# Patient Record
Sex: Female | Born: 1955 | Race: White | Hispanic: No | Marital: Married | Smoking: Never smoker
Health system: Southern US, Community
[De-identification: ages and names within clinical notes are randomized; demographics above are authoritative.]

## PROBLEM LIST (undated history)

## (undated) DIAGNOSIS — M810 Age-related osteoporosis without current pathological fracture: Secondary | ICD-10-CM

## (undated) DIAGNOSIS — I10 Essential (primary) hypertension: Secondary | ICD-10-CM

## (undated) DIAGNOSIS — E559 Vitamin D deficiency, unspecified: Secondary | ICD-10-CM

## (undated) DIAGNOSIS — E785 Hyperlipidemia, unspecified: Secondary | ICD-10-CM

## (undated) DIAGNOSIS — K219 Gastro-esophageal reflux disease without esophagitis: Secondary | ICD-10-CM

## (undated) DIAGNOSIS — Z860100 Personal history of colon polyps, unspecified: Secondary | ICD-10-CM

## (undated) DIAGNOSIS — N900 Mild vulvar dysplasia: Secondary | ICD-10-CM

## (undated) DIAGNOSIS — L409 Psoriasis, unspecified: Secondary | ICD-10-CM

## (undated) DIAGNOSIS — T7840XA Allergy, unspecified, initial encounter: Secondary | ICD-10-CM

## (undated) DIAGNOSIS — N761 Subacute and chronic vaginitis: Secondary | ICD-10-CM

## (undated) DIAGNOSIS — K2270 Barrett's esophagus without dysplasia: Secondary | ICD-10-CM

## (undated) DIAGNOSIS — J45909 Unspecified asthma, uncomplicated: Secondary | ICD-10-CM

## (undated) DIAGNOSIS — M1990 Unspecified osteoarthritis, unspecified site: Secondary | ICD-10-CM

## (undated) DIAGNOSIS — B019 Varicella without complication: Secondary | ICD-10-CM

## (undated) DIAGNOSIS — R7303 Prediabetes: Secondary | ICD-10-CM

## (undated) HISTORY — DX: Gastro-esophageal reflux disease without esophagitis: K21.9

## (undated) HISTORY — DX: Personal history of colonic polyps: Z86.010

## (undated) HISTORY — DX: Age-related osteoporosis without current pathological fracture: M81.0

## (undated) HISTORY — DX: Barrett's esophagus without dysplasia: K22.70

## (undated) HISTORY — DX: Allergy, unspecified, initial encounter: T78.40XA

## (undated) HISTORY — DX: Varicella without complication: B01.9

## (undated) HISTORY — DX: Essential (primary) hypertension: I10

## (undated) HISTORY — DX: Unspecified osteoarthritis, unspecified site: M19.90

## (undated) HISTORY — DX: Personal history of colon polyps, unspecified: Z86.0100

## (undated) HISTORY — DX: Subacute and chronic vaginitis: N76.1

## (undated) HISTORY — DX: Vitamin D deficiency, unspecified: E55.9

## (undated) HISTORY — DX: Hyperlipidemia, unspecified: E78.5

## (undated) HISTORY — DX: Unspecified asthma, uncomplicated: J45.909

---
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
## 1981-12-31 HISTORY — PX: CHOLECYSTECTOMY: SHX55

## 2007-02-17 ENCOUNTER — Emergency Department: Payer: Self-pay | Admitting: Emergency Medicine

## 2007-09-03 ENCOUNTER — Ambulatory Visit: Payer: Self-pay | Admitting: Internal Medicine

## 2007-11-11 ENCOUNTER — Ambulatory Visit: Payer: Self-pay | Admitting: Internal Medicine

## 2008-01-22 ENCOUNTER — Ambulatory Visit: Payer: Self-pay | Admitting: Podiatry

## 2009-03-17 IMAGING — CR RIGHT HIP - COMPLETE 2+ VIEW
1 series · 2 of 2 positions shown · non-contrast
Comparison: none

REASON FOR EXAM: Sprain and swelling three days ago
COMMENTS:

[Series 1: view not recorded · 0.17mm/px · 2 of 2 slices shown]
[im 1/2]
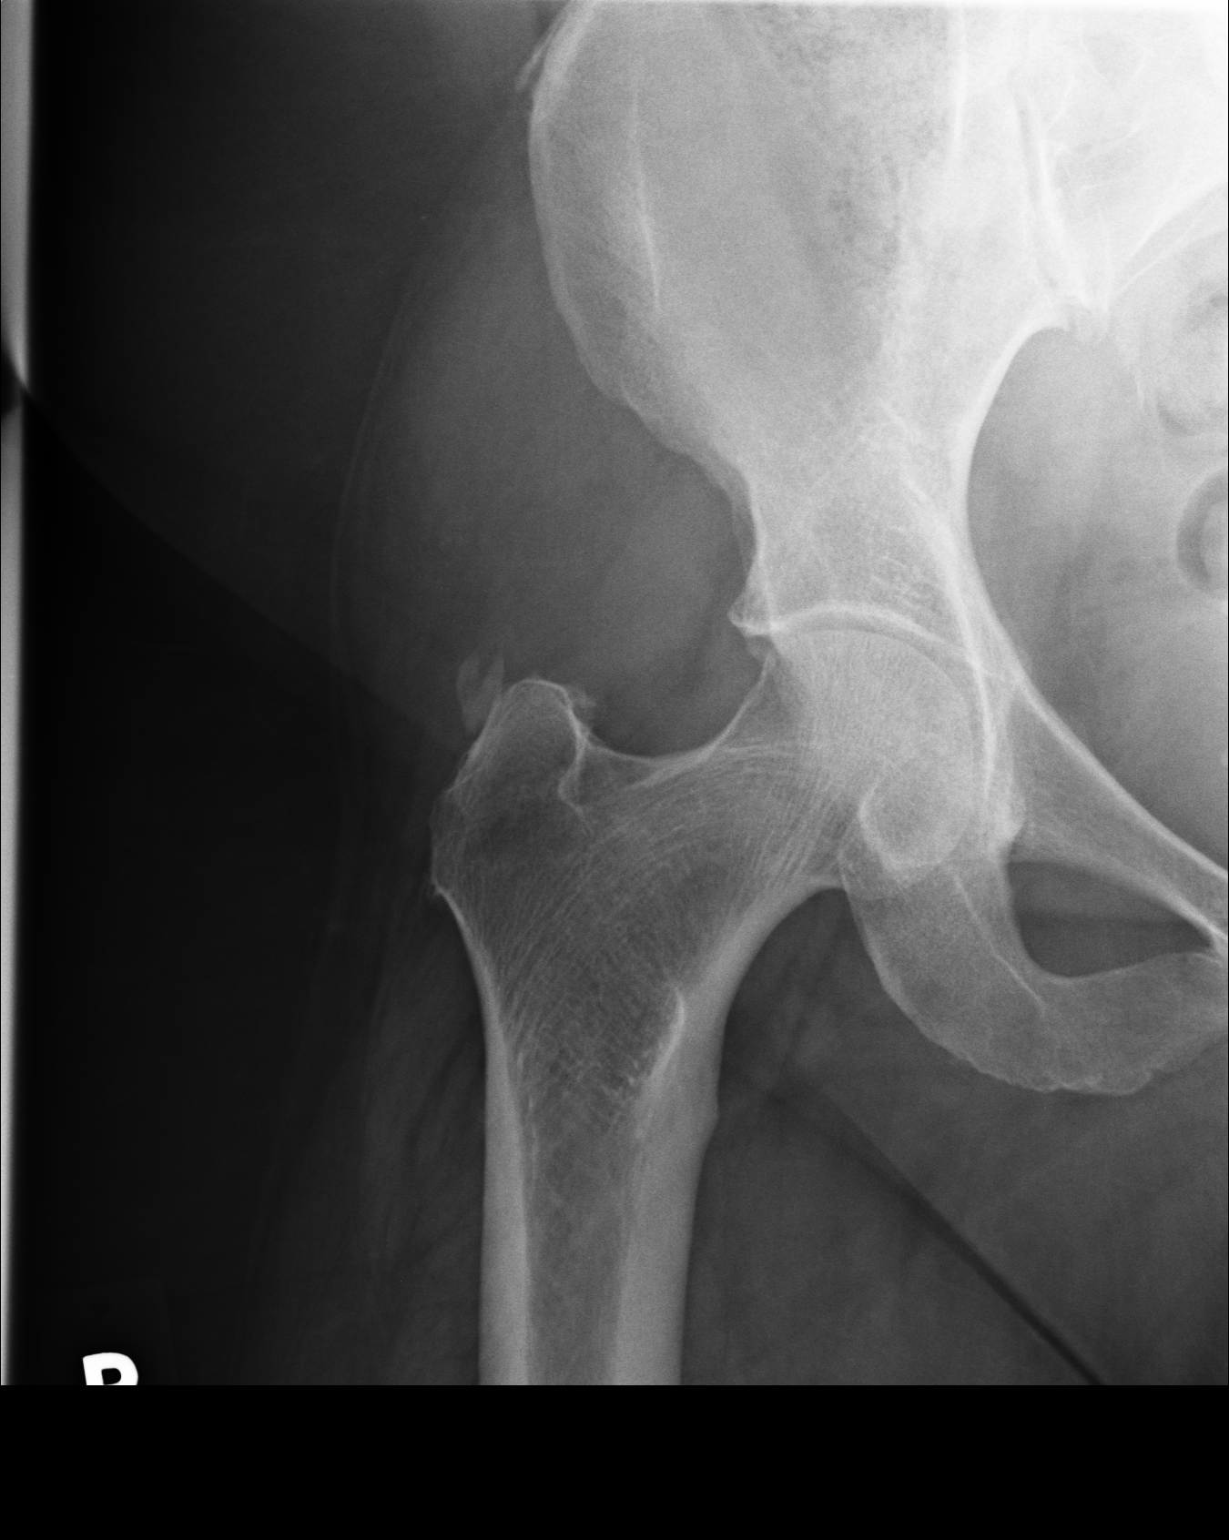
[im 2/2]
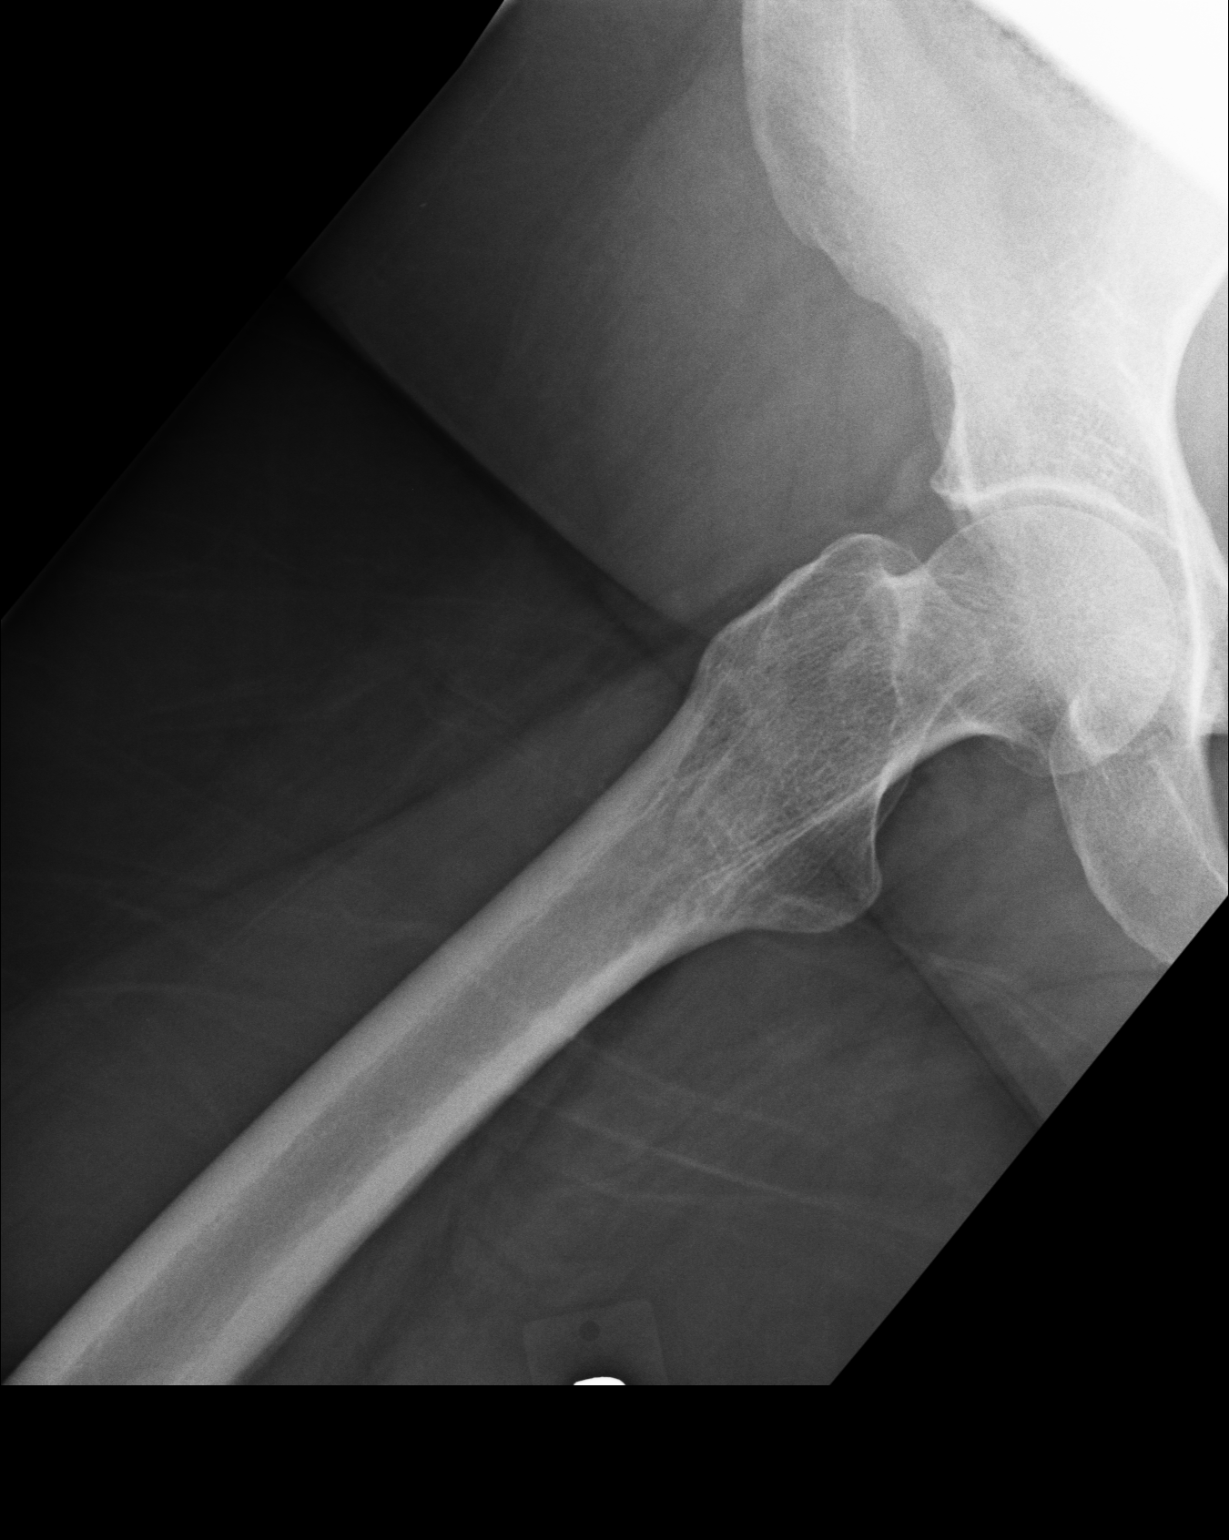

[2 of 2 positions shown; findings below may reference images not displayed]

PROCEDURE:     DXR - DXR HIP RIGHT COMPLETE  - November 11, 2007  [DATE]

RESULT:     There is calcification adjacent to the greater tuberosity that
likely reflects calcific bursitis. The femoral head and neck and
intertrochanteric region are normally mineralized with no evidence of acute
or old fracture. The acetabular components appear normal and the joint
spaces preserved.
IMPRESSION: I see no acute abnormality of the hip joint but I am
suspicious that there is calcific bursitis associated with the greater
trochanter.

## 2009-03-17 IMAGING — CR RIGHT ANKLE - COMPLETE 3+ VIEW
1 series · 7 of 7 positions shown · non-contrast
Comparison: none

REASON FOR EXAM: Sprain and swelling three days ago
COMMENTS:

[Series 1: view not recorded · 0.17mm/px · 7 of 7 slices shown]
[im 1/7]
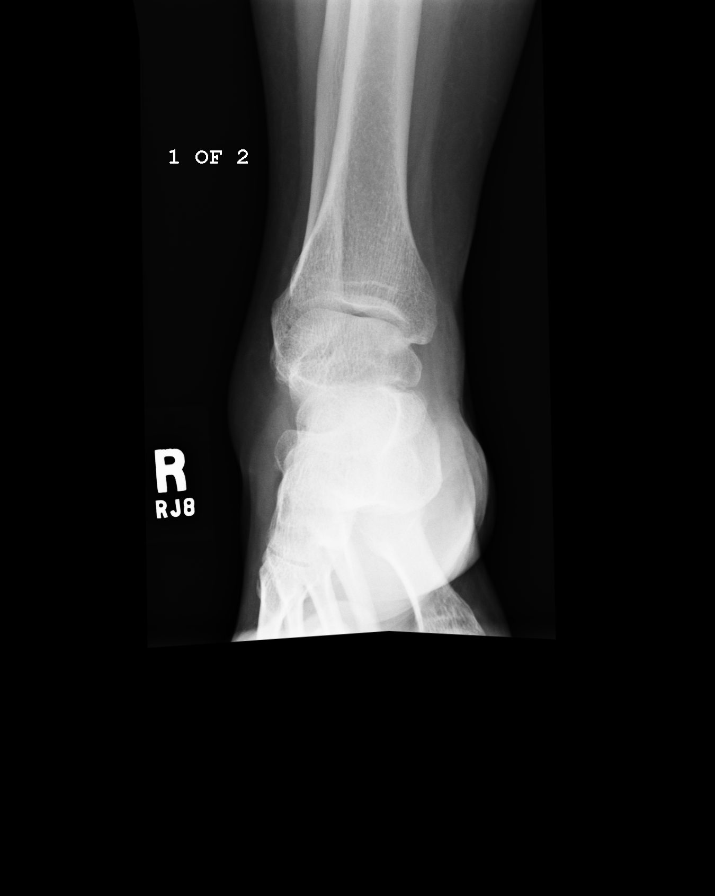
[im 2/7]
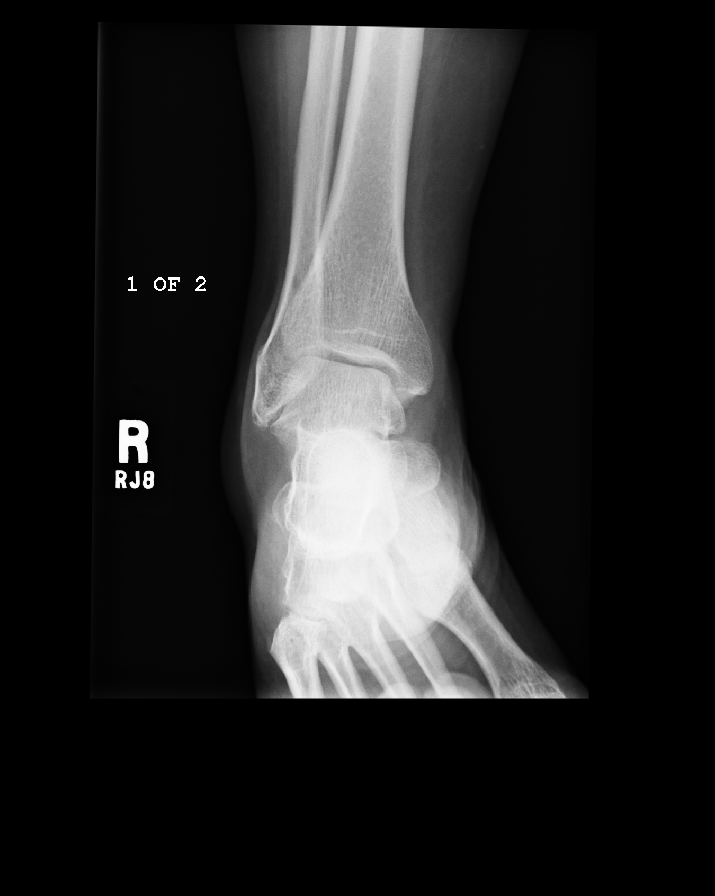
[im 3/7]
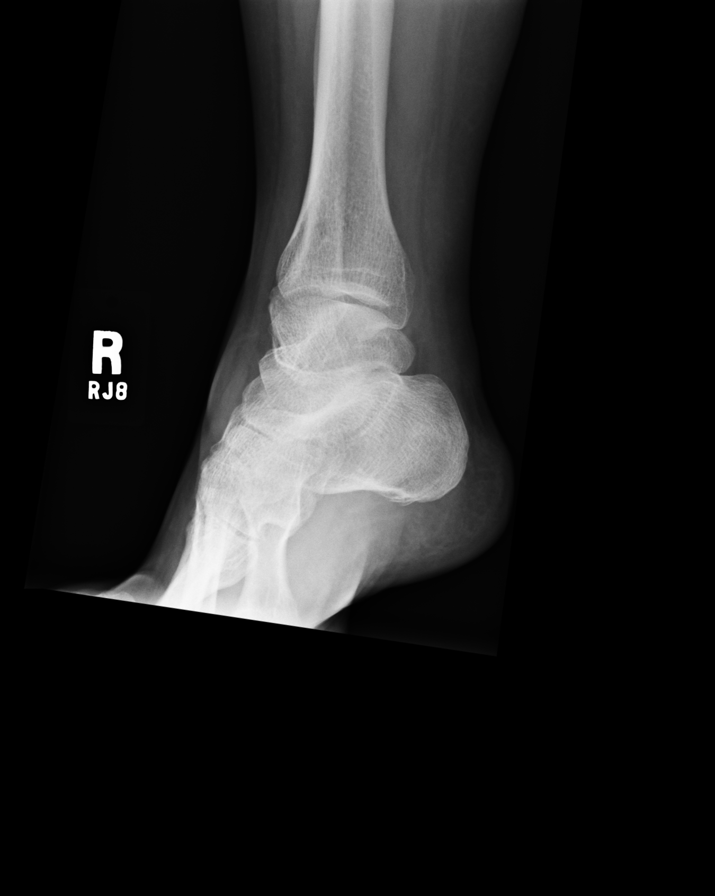
[im 4/7]
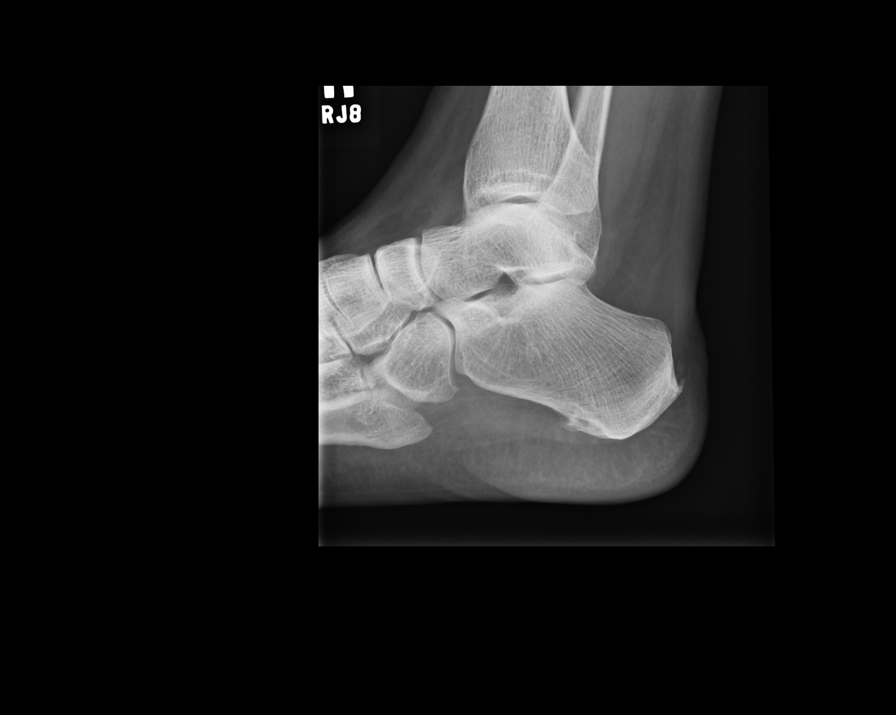
[im 5/7]
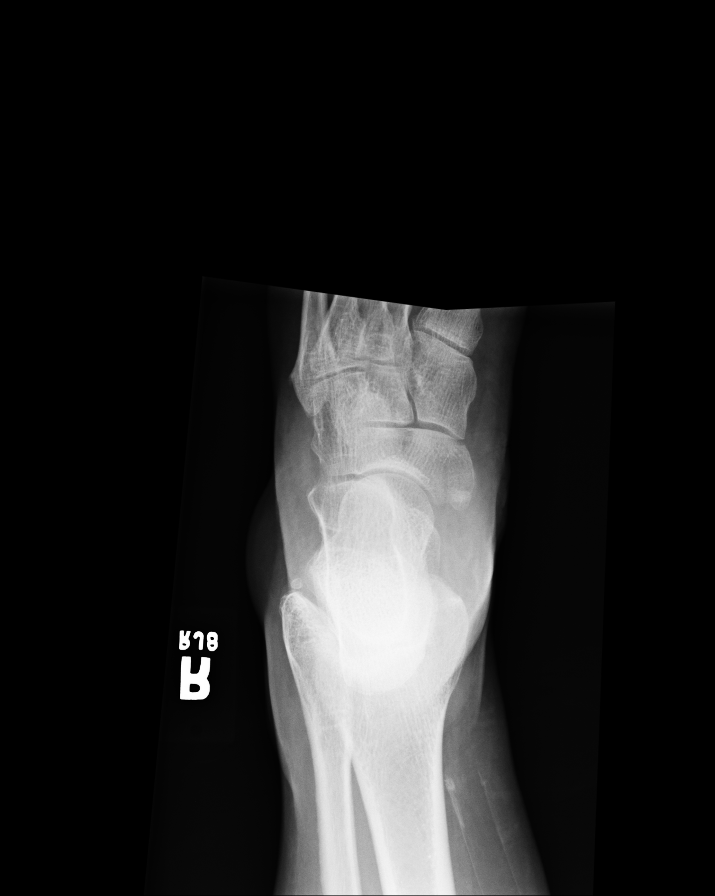
[im 6/7]
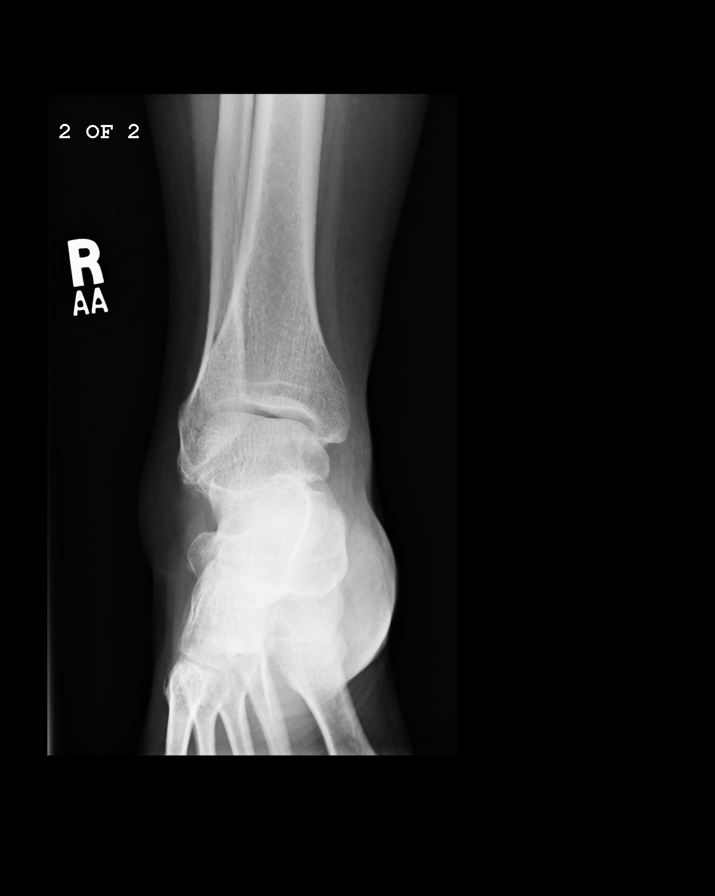
[im 7/7]
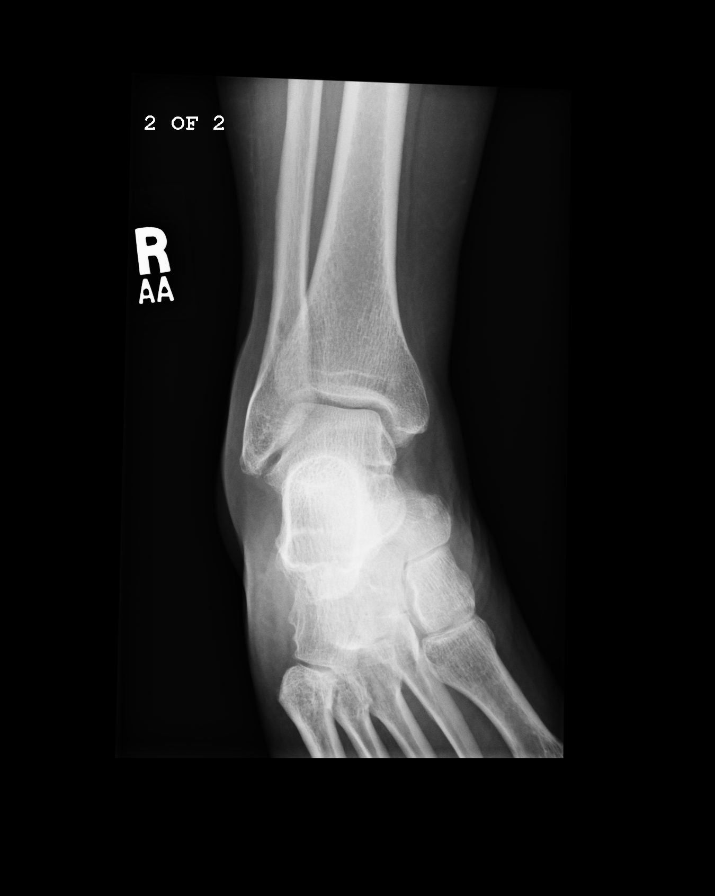

[7 of 7 positions shown; findings below may reference images not displayed]

PROCEDURE:     DXR - DXR ANKLE RIGHT COMPLETE  - November 11, 2007  [DATE]

RESULT:     The ankle joint mortise is preserved. The talar dome appears
intact. There are Achilles region and plantar calcaneal spurs. I do not see
evidence of an acute malleolar fracture. Very mild degenerative change is
noted associated with the tip of the fibula.
IMPRESSION: I do not see evidence of acute or chronic fracture or
dislocation. There are mild degenerative changes present. Follow-up MRI may
be indicated if the patient's symptoms warrant this.

## 2009-04-06 ENCOUNTER — Ambulatory Visit: Payer: Self-pay | Admitting: Internal Medicine

## 2009-06-23 ENCOUNTER — Ambulatory Visit: Payer: Self-pay | Admitting: Gastroenterology

## 2009-06-23 LAB — HM COLONOSCOPY

## 2011-04-16 ENCOUNTER — Ambulatory Visit: Payer: Self-pay | Admitting: Gastroenterology

## 2011-04-18 LAB — PATHOLOGY REPORT

## 2011-07-13 ENCOUNTER — Ambulatory Visit: Payer: Self-pay | Admitting: Internal Medicine

## 2012-03-07 ENCOUNTER — Ambulatory Visit: Payer: Self-pay

## 2015-06-13 ENCOUNTER — Ambulatory Visit (INDEPENDENT_AMBULATORY_CARE_PROVIDER_SITE_OTHER): Payer: Self-pay | Admitting: Internal Medicine

## 2015-06-13 ENCOUNTER — Encounter (INDEPENDENT_AMBULATORY_CARE_PROVIDER_SITE_OTHER): Payer: Self-pay

## 2015-06-13 ENCOUNTER — Encounter: Payer: Self-pay | Admitting: Internal Medicine

## 2015-06-13 VITALS — BP 132/74 | HR 72 | Temp 98.1°F | Ht 63.0 in | Wt 260.0 lb

## 2015-06-13 DIAGNOSIS — I10 Essential (primary) hypertension: Secondary | ICD-10-CM

## 2015-06-13 DIAGNOSIS — K2270 Barrett's esophagus without dysplasia: Secondary | ICD-10-CM

## 2015-06-13 DIAGNOSIS — J4520 Mild intermittent asthma, uncomplicated: Secondary | ICD-10-CM

## 2015-06-13 DIAGNOSIS — N898 Other specified noninflammatory disorders of vagina: Secondary | ICD-10-CM

## 2015-06-13 DIAGNOSIS — J45909 Unspecified asthma, uncomplicated: Secondary | ICD-10-CM | POA: Insufficient documentation

## 2015-06-13 DIAGNOSIS — M1990 Unspecified osteoarthritis, unspecified site: Secondary | ICD-10-CM | POA: Insufficient documentation

## 2015-06-13 DIAGNOSIS — J302 Other seasonal allergic rhinitis: Secondary | ICD-10-CM

## 2015-06-13 LAB — CBC
HCT: 35.6 % — ABNORMAL LOW (ref 36.0–46.0)
HEMOGLOBIN: 11.8 g/dL — AB (ref 12.0–15.0)
MCHC: 33.1 g/dL (ref 30.0–36.0)
MCV: 91 fl (ref 78.0–100.0)
PLATELETS: 392 10*3/uL (ref 150.0–400.0)
RBC: 3.91 Mil/uL (ref 3.87–5.11)
RDW: 13.8 % (ref 11.5–15.5)
WBC: 7.6 10*3/uL (ref 4.0–10.5)

## 2015-06-13 LAB — COMPREHENSIVE METABOLIC PANEL
ALK PHOS: 62 U/L (ref 39–117)
ALT: 29 U/L (ref 0–35)
AST: 21 U/L (ref 0–37)
Albumin: 4.4 g/dL (ref 3.5–5.2)
BUN: 15 mg/dL (ref 6–23)
CO2: 29 mEq/L (ref 19–32)
CREATININE: 0.63 mg/dL (ref 0.40–1.20)
Calcium: 9.8 mg/dL (ref 8.4–10.5)
Chloride: 101 mEq/L (ref 96–112)
GFR: 103.3 mL/min (ref >60.00)
Glucose, Bld: 100 mg/dL — ABNORMAL HIGH (ref 70–99)
Potassium: 3.7 mEq/L (ref 3.5–5.1)
Sodium: 138 mEq/L (ref 135–145)
TOTAL PROTEIN: 7.1 g/dL (ref 6.0–8.3)
Total Bilirubin: 0.5 mg/dL (ref 0.2–1.2)

## 2015-06-13 MED ORDER — ESTRADIOL 0.1 MG/GM VA CREA: TOPICAL_CREAM | VAGINAL | Status: DC

## 2015-06-13 NOTE — Progress Notes (Signed)
Pre visit review using our clinic review tool, if applicable. No additional management support is needed unless otherwise documented below in the visit note. 

## 2015-06-13 NOTE — Assessment & Plan Note (Signed)
Continue Albuterol prn 

## 2015-06-13 NOTE — Assessment & Plan Note (Signed)
Continue Prevacid daily She will have repeat upper GI in 2017

## 2015-06-13 NOTE — Assessment & Plan Note (Signed)
Will start Estrace cream

## 2015-06-13 NOTE — Assessment & Plan Note (Signed)
Discussed how weight loss would help take some of the stress off her knees Continue Aleve prn

## 2015-06-13 NOTE — Assessment & Plan Note (Signed)
BP well controlled on Lisinopril-HCT Will check CBC and CMET today

## 2015-06-13 NOTE — Patient Instructions (Signed)

## 2015-06-13 NOTE — Assessment & Plan Note (Signed)
Continue Tylenol Sinus and Rhinocort prn

## 2015-06-13 NOTE — Progress Notes (Signed)
HPI  Pt presents to the clinic today to establish care and for management of the conditions listed below. Patricia Garner is transferring care from Dr. Yancey Flemings at [facility].  Flu: never Tetanus: unsure of her last one Pneumovax: never Pap Smear: Patricia Garner thinks it was in 2013 Mammogram: Patricia Garner thinks it was in 2013 at [facility]: 2012, [facility] (5 years) Vision Screening: yearly Dentist: yearly  Arthritis: Mainly in hands and knees. Patricia Garner does weight the most Patricia Garner has ever weighed and it is reeking havoc on her knees. Patricia Garner has not noticed any swelling. Patricia Garner does take Aleve prn.  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 Her asthma only flares up when Patricia Garner gets a respiratory infection. Patricia Garner will take the Albuterol for a short period of time during those times of illness.  Seasonal Allergies: Worse in the spring. Patricia Garner takes Tylenol Sinus and Rhinocort only as needed.  GERD: Patricia Garner has known Barrett's Esophagus. Her last upper endoscopy was in 2012. Patricia Garner takes Prevacid daily.  HTN: BP well controlled on Lisinopril-HCT. Her BP today is 132/74. Patricia Garner denies adverse effects.  Patricia Garner is concerned about vaginal dryness. This started in the last 6 months. It does cause intercourse to be painful. Patricia Garner has not tried any OTC medications. Patricia Garner denies any other menopausal symptoms.  Past Medical History  Diagnosis Date  . Arthritis   . Chicken pox   . Asthma   . GERD (gastroesophageal reflux disease)   . Allergy   . Hypertension   . History of colon polyps   . Barrett esophagus     Current Outpatient Prescriptions  Medication Sig Dispense Refill  . albuterol (PROVENTIL HFA;VENTOLIN HFA) 108 (90 BASE) MCG/ACT inhaler Inhale 1-2 puffs into the lungs every 6 (six) hours as needed for wheezing or shortness of breath.    . ferrous sulfate 325 (65 FE) MG EC tablet Take 325 mg by mouth daily as needed.    . lansoprazole (PREVACID) 15 MG capsule Take 15 mg by mouth daily at 12 noon.    Marland Kitchen  lisinopril-hydrochlorothiazide (PRINZIDE,ZESTORETIC) 20-25 MG per tablet Take 1 tablet by mouth daily.    . Multiple Minerals-Vitamins (CALCIUM-MAGNESIUM-ZINC-D3 PO) Take 1 capsule by mouth daily.    . Multiple Vitamins-Minerals (MULTIVITAMIN WITH MINERALS) tablet Take 1 tablet by mouth daily.     No current facility-administered medications for this visit.    Allergies  Allergen Reactions  . Codeine Nausea And Vomiting    Family History  Problem Relation Age of Onset  . Arthritis Mother   . Heart disease Mother   . Stroke Mother   . Hypertension Mother   . Alcohol abuse Father   . Alcohol abuse Sister   . Mental illness Sister   . Alcohol abuse Brother   . Drug abuse Brother   . Arthritis Maternal Grandmother   . Stroke Maternal Grandmother   . Colon cancer Maternal Grandfather     History   Social History  . Marital Status: Married    Spouse Name: N/A  . Number of Children: N/A  . Years of Education: N/A   Occupational History  . Not on file.   Social History Main Topics  . Smoking status: Never Smoker   . Smokeless tobacco: Never Used  . Alcohol Use: No  . Drug Use: No  . Sexual Activity: Not on file   Other Topics Concern  . Not on file   Social History Narrative  . No narrative on file  ROS:  Constitutional: Pt reports weight gain. Denies fever, malaise, fatigue, headache.  HEENT: Denies eye pain, eye redness, ear pain, ringing in the ears, wax buildup, runny nose, nasal congestion, bloody nose, or sore throat. Respiratory: Denies difficulty breathing, shortness of breath, cough or sputum production.   Cardiovascular: Denies chest pain, chest tightness, palpitations or swelling in the hands or feet.  Gastrointestinal: Denies abdominal pain, bloating, constipation, diarrhea or blood in the stool.  GU: Pt reports vaginal dryness. Denies frequency, urgency, pain with urination, blood in urine, odor or discharge. Musculoskeletal: Pt reports joint pain.  Denies decrease in range of motion, difficulty with gait, muscle pain or joint swelling.  Skin: Denies redness, rashes, lesions or ulcercations.  Neurological: Denies dizziness, difficulty with memory, difficulty with speech or problems with balance and coordination.  Psych: Denies anxiety, depression, SI/HI.  No other specific complaints in a complete review of systems (except as listed in HPI above).  PE:  BP 132/74 mmHg  Pulse 72  Temp(Src) 98.1 F (36.7 C) (Oral)  Ht 5\' 3"  (1.6 m)  Wt 260 lb (117.935 kg)  BMI 46.07 kg/m2  SpO2 98%  Wt Readings from Last 3 Encounters:  06/13/15 260 lb (117.935 kg)    General: Appears her stated age, obese in NAD. HEENT: Head: normal shape and size; Eyes: sclera white, no icterus, conjunctiva pink; Nose: mucosa pink and moist, septum midline; Throat/Mouth: Teeth present, mucosa pink and moist, no lesions or ulcerations noted.  Neck: No adenopathy noted.  Cardiovascular: Normal rate and rhythm. S1,S2 noted.  No murmur, rubs or gallops noted. Trace BLE edema. No carotid bruits noted. Pulmonary/Chest: Normal effort and positive vesicular breath sounds. No respiratory distress. No wheezes, rales or ronchi noted.  Abdomen: Soft and nontender. Normal bowel sounds, no bruits noted. No distention or masses noted.  Musculoskeletal: Herbdens nodes noted of pinky finger on right hand. Normal flexion and extension of knees bilaterally. No joint effusion noted. Crepitus noted with ROM. Strength 5/5 BLE. No difficulty with gait.  Neurological: Alert and oriented.  Psychiatric: Mood and affect normal. Behavior is normal. Judgment and thought content normal.    Assessment and Plan:  RTC in 6 months for annual exam/follow up chronic condtions

## 2015-06-13 NOTE — Assessment & Plan Note (Signed)
Encouraged her to work on diet and exercise She plans to join the [employer] near her house

## 2015-08-03 ENCOUNTER — Encounter: Payer: Self-pay | Admitting: Internal Medicine

## 2015-08-15 ENCOUNTER — Other Ambulatory Visit: Payer: Self-pay

## 2015-08-15 MED ORDER — LISINOPRIL-HYDROCHLOROTHIAZIDE 20-25 MG PO TABS: 1.0000 | ORAL_TABLET | Freq: Every day | ORAL | Status: DC

## 2015-08-15 NOTE — Telephone Encounter (Signed)
Pt left v/m requesting new rx for lisinopril-HCTZ to [address]. Pt established with Webb Silversmith NP on 06/13/15. Avie Echevaria NP has not filled med yet.Please advise.

## 2015-11-09 LAB — HM MAMMOGRAPHY

## 2015-11-16 LAB — HM PAP SMEAR: HM PAP: NEGATIVE

## 2015-12-01 ENCOUNTER — Other Ambulatory Visit: Payer: Self-pay | Admitting: Internal Medicine

## 2015-12-01 DIAGNOSIS — Z0000 Encounter for general adult medical examination without abnormal findings: Secondary | ICD-10-CM

## 2015-12-06 ENCOUNTER — Other Ambulatory Visit (INDEPENDENT_AMBULATORY_CARE_PROVIDER_SITE_OTHER): Payer: Managed Care, Other (non HMO)

## 2015-12-06 DIAGNOSIS — Z0000 Encounter for general adult medical examination without abnormal findings: Secondary | ICD-10-CM

## 2015-12-06 LAB — CBC
HCT: 36.4 % (ref 36.0–46.0)
Hemoglobin: 12.2 g/dL (ref 12.0–15.0)
MCHC: 33.5 g/dL (ref 30.0–36.0)
MCV: 90.3 fl (ref 78.0–100.0)
PLATELETS: 345 10*3/uL (ref 150.0–400.0)
RBC: 4.03 Mil/uL (ref 3.87–5.11)
RDW: 13.5 % (ref 11.5–15.5)
WBC: 8.6 10*3/uL (ref 4.0–10.5)

## 2015-12-06 LAB — COMPREHENSIVE METABOLIC PANEL
ALT: 26 U/L (ref 0–35)
AST: 17 U/L (ref 0–37)
Albumin: 4.3 g/dL (ref 3.5–5.2)
Alkaline Phosphatase: 64 U/L (ref 39–117)
BILIRUBIN TOTAL: 0.5 mg/dL (ref 0.2–1.2)
BUN: 19 mg/dL (ref 6–23)
CALCIUM: 9.7 mg/dL (ref 8.4–10.5)
CHLORIDE: 102 meq/L (ref 96–112)
CO2: 27 mEq/L (ref 19–32)
CREATININE: 0.69 mg/dL (ref 0.40–1.20)
GFR: 92.85 mL/min (ref >60.00)
GLUCOSE: 114 mg/dL — AB (ref 70–99)
Potassium: 3.8 mEq/L (ref 3.5–5.1)
Sodium: 139 mEq/L (ref 135–145)
Total Protein: 7.2 g/dL (ref 6.0–8.3)

## 2015-12-06 LAB — LIPID PANEL
CHOLESTEROL: 224 mg/dL — AB (ref 0–200)
HDL: 38.4 mg/dL — ABNORMAL LOW (ref >39.00)
LDL Cholesterol: 152 mg/dL — ABNORMAL HIGH (ref 0–99)
NonHDL: 185.47
Total CHOL/HDL Ratio: 6
Triglycerides: 165 mg/dL — ABNORMAL HIGH (ref 0.0–149.0)
VLDL: 33 mg/dL (ref 0.0–40.0)

## 2015-12-06 LAB — HEMOGLOBIN A1C: HEMOGLOBIN A1C: 6 % (ref 4.6–6.5)

## 2015-12-13 ENCOUNTER — Encounter: Payer: Self-pay | Admitting: Internal Medicine

## 2015-12-13 ENCOUNTER — Ambulatory Visit (INDEPENDENT_AMBULATORY_CARE_PROVIDER_SITE_OTHER): Payer: Managed Care, Other (non HMO) | Admitting: Internal Medicine

## 2015-12-13 ENCOUNTER — Encounter: Payer: Self-pay | Admitting: Primary Care

## 2015-12-13 VITALS — BP 126/78 | HR 74 | Temp 98.2°F | Ht 63.0 in | Wt 257.0 lb

## 2015-12-13 DIAGNOSIS — J302 Other seasonal allergic rhinitis: Secondary | ICD-10-CM

## 2015-12-13 DIAGNOSIS — Z0000 Encounter for general adult medical examination without abnormal findings: Secondary | ICD-10-CM | POA: Diagnosis not present

## 2015-12-13 DIAGNOSIS — I10 Essential (primary) hypertension: Secondary | ICD-10-CM

## 2015-12-13 DIAGNOSIS — M1990 Unspecified osteoarthritis, unspecified site: Secondary | ICD-10-CM

## 2015-12-13 DIAGNOSIS — J4520 Mild intermittent asthma, uncomplicated: Secondary | ICD-10-CM | POA: Diagnosis not present

## 2015-12-13 DIAGNOSIS — K22719 Barrett's esophagus with dysplasia, unspecified: Secondary | ICD-10-CM | POA: Diagnosis not present

## 2015-12-13 DIAGNOSIS — E785 Hyperlipidemia, unspecified: Secondary | ICD-10-CM

## 2015-12-13 MED ORDER — ALBUTEROL SULFATE HFA 108 (90 BASE) MCG/ACT IN AERS: 1.0000 | INHALATION_SPRAY | Freq: Four times a day (QID) | RESPIRATORY_TRACT | Status: DC | PRN

## 2015-12-13 NOTE — Assessment & Plan Note (Signed)
Well controlled on Lisinopril HCT CMET reviewed DASH diet

## 2015-12-13 NOTE — Progress Notes (Signed)
Pre visit review using our clinic review tool, if applicable. No additional management support is needed unless otherwise documented below in the visit note. 

## 2015-12-13 NOTE — Progress Notes (Signed)
Subjective:    Patient ID: Patricia Garner, female    DOB: 09-15-1956, 58 y.o.   MRN: [id number]  HPI  Pt presents to the clinic today for her annual exam. She is also due for follow up of chronic conditions, see separate note.  Flu: never Tetanus: unsure Pap Smear: 11/2015 Mammogram: 11/2015 Colon Screening: 2012, at [facility], 5 years Vision Screening: yearly Dentist: annually  Diet: She does eat meat. She consume fruits and veggies daily. She tries to avoid fried foods. She drinks mostly water. Exercise: She does not exercise.  Review of Systems      Past Medical History  Diagnosis Date  . Arthritis   . Chicken pox   . Asthma   . GERD (gastroesophageal reflux disease)   . Allergy   . Hypertension   . History of colon polyps   . Barrett esophagus     Current Outpatient Prescriptions  Medication Sig Dispense Refill  . albuterol (PROVENTIL HFA;VENTOLIN HFA) 108 (90 BASE) MCG/ACT inhaler Inhale 1-2 puffs into the lungs every 6 (six) hours as needed for wheezing or shortness of breath.    . ferrous sulfate 325 (65 FE) MG EC tablet Take 325 mg by mouth daily as needed.    . lansoprazole (PREVACID) 15 MG capsule Take 15 mg by mouth daily at 12 noon.    Marland Kitchen lisinopril-hydrochlorothiazide (PRINZIDE,ZESTORETIC) 20-25 MG per tablet Take 1 tablet by mouth daily. 90 tablet 1  . Multiple Vitamins-Minerals (MULTIVITAMIN WITH MINERALS) tablet Take 1 tablet by mouth daily.    Marland Kitchen nystatin-triamcinolone ointment (MYCOLOG) Apply 1 application topically as needed.     No current facility-administered medications for this visit.    Allergies  Allergen Reactions  . Codeine Nausea And Vomiting    Family History  Problem Relation Age of Onset  . Arthritis Mother   . Heart disease Mother   . Stroke Mother   . Hypertension Mother   . Alcohol abuse Father   . Alcohol abuse Sister   . Mental illness Sister   . Alcohol abuse Brother   . Drug abuse Brother   . Arthritis Maternal  Grandmother   . Stroke Maternal Grandmother   . Colon cancer Maternal Grandfather     Social History   Social History  . Marital Status: Married    Spouse Name: N/A  . Number of Children: N/A  . Years of Education: N/A   Occupational History  . Not on file.   Social History Main Topics  . Smoking status: Never Smoker   . Smokeless tobacco: Never Used  . Alcohol Use: No  . Drug Use: No  . Sexual Activity: Not Currently   Other Topics Concern  . Not on file   Social History Narrative     Constitutional: Denies fever, malaise, fatigue, headache or abrupt weight changes.  HEENT: Denies eye pain, eye redness, ear pain, ringing in the ears, wax buildup, runny nose, nasal congestion, bloody nose, or sore throat. Respiratory: Denies difficulty breathing, shortness of breath, cough or sputum production.   Cardiovascular: Denies chest pain, chest tightness, palpitations or swelling in the hands or feet.  Gastrointestinal: Denies abdominal pain, bloating, constipation, diarrhea or blood in the stool.  GU: Denies urgency, frequency, pain with urination, burning sensation, blood in urine, odor or discharge. Musculoskeletal: Pt reports joint pain in hands. Denies decrease in range of motion, difficulty with gait, muscle pain or joint swelling.  Skin: Pt reports skin lesion on right arm. Denies redness, rashes,  or ulcercations.  Neurological: Denies dizziness, difficulty with memory, difficulty with speech or problems with balance and coordination.  Psych: Denies anxiety, depression, SI/HI.  No other specific complaints in a complete review of systems (except as listed in HPI above).  Objective:   Physical Exam   BP 126/78 mmHg  Pulse 74  Temp(Src) 98.2 F (36.8 C) (Oral)  Ht 5\' 3"  (1.6 m)  Wt 257 lb (116.574 kg)  BMI 45.54 kg/m2  SpO2 98% Wt Readings from Last 3 Encounters:  12/13/15 257 lb (116.574 kg)  06/13/15 260 lb (117.935 kg)    General: Appears her stated age,  obese in NAD. Skin: Warm, dry and intact. Seborrheic keratosis noted on right upper arm. HEENT: Head: normal shape and size; Eyes: sclera white, no icterus, conjunctiva pink, PERRLA and EOMs intact; Left Ear: Tm's gray and intact, normal light reflex; Right Ear: cerumen impaction. Throat/Mouth: Teeth present, mucosa pink and moist, no exudate, lesions or ulcerations noted.  Neck:  Neck supple, trachea midline. No masses, lumps or thyromegaly present.  Cardiovascular: Normal rate and rhythm. S1,S2 noted.  No murmur, rubs or gallops noted. No JVD or BLE edema. No carotid bruits noted. Pulmonary/Chest: Normal effort and positive vesicular breath sounds. No respiratory distress. No wheezes, rales or ronchi noted.  Abdomen: Soft and nontender. Normal bowel sounds. No distention or masses noted. Liver, spleen and kidneys non palpable. Musculoskeletal: Joint enlargement noted in hands bilaterally. Normal range of motion. No difficulty with gait.  Neurological: Alert and oriented. Cranial nerves II-XII grossly intact. Coordination normal.  Psychiatric: Mood and affect normal. Behavior is normal. Judgment and thought content normal.     BMET    Component Value Date/Time   NA 139 12/06/2015 0905   K 3.8 12/06/2015 0905   CL 102 12/06/2015 0905   CO2 27 12/06/2015 0905   GLUCOSE 114* 12/06/2015 0905   BUN 19 12/06/2015 0905   CREATININE 0.69 12/06/2015 0905   CALCIUM 9.7 12/06/2015 0905    Lipid Panel     Component Value Date/Time   CHOL 224* 12/06/2015 0905   TRIG 165.0* 12/06/2015 0905   HDL 38.40* 12/06/2015 0905   CHOLHDL 6 12/06/2015 0905   VLDL 33.0 12/06/2015 0905   LDLCALC 152* 12/06/2015 0905    CBC    Component Value Date/Time   WBC 8.6 12/06/2015 0905   RBC 4.03 12/06/2015 0905   HGB 12.2 12/06/2015 0905   HCT 36.4 12/06/2015 0905   PLT 345.0 12/06/2015 0905   MCV 90.3 12/06/2015 0905   MCHC 33.5 12/06/2015 0905   RDW 13.5 12/06/2015 0905    Hgb A1C Lab Results    Component Value Date   HGBA1C 6.0 12/06/2015        Assessment & Plan:   Preventative Health Maintenance:  She declines flu and Tdap Mammogram and Pap Smear UTD Colonoscopy due 2017 Encouraged her to continue to see an eye doctor and dentist annually Encouraged her to consume a healthy diet and start an exercise regimen CBC, CMET, Lipid and A1C reviewed  RTC in 6 months to follow up chronic conditions.  HPI:  Pt presents to the clinic today to follow up chronic conditions.  Arthritis: Mainly in hands and knees. She has not noticed any swelling. She does take Aleve prn with good relief.  Asthma: She reports she has "conditional" asthma. Her asthma only flares up when she gets a respiratory infection. She will take the Albuterol for a short period of time during those times  of illness. She will need a refill of Albuterol today, her other inhaler expired.  Seasonal Allergies: Worse in the spring. She takes Tylenol Sinus and Rhinocort only as needed.  GERD: She has known Barrett's Esophagus. Her last upper endoscopy was in 2012. She takes Prevacid daily.  HTN: BP well controlled on Lisinopril-HCT. Her BP today is 126/78. She denies adverse effects.  Review of Systems:   Past Medical History  Diagnosis Date  . Arthritis   . Chicken pox   . Asthma   . GERD (gastroesophageal reflux disease)   . Allergy   . Hypertension   . History of colon polyps   . Barrett esophagus     Current Outpatient Prescriptions  Medication Sig Dispense Refill  . albuterol (PROVENTIL HFA;VENTOLIN HFA) 108 (90 BASE) MCG/ACT inhaler Inhale 1-2 puffs into the lungs every 6 (six) hours as needed for wheezing or shortness of breath.    . ferrous sulfate 325 (65 FE) MG EC tablet Take 325 mg by mouth daily as needed.    . lansoprazole (PREVACID) 15 MG capsule Take 15 mg by mouth daily at 12 noon.    Marland Kitchen lisinopril-hydrochlorothiazide (PRINZIDE,ZESTORETIC) 20-25 MG per tablet Take 1 tablet by mouth  daily. 90 tablet 1  . Multiple Vitamins-Minerals (MULTIVITAMIN WITH MINERALS) tablet Take 1 tablet by mouth daily.    Marland Kitchen nystatin-triamcinolone ointment (MYCOLOG) Apply 1 application topically as needed.     No current facility-administered medications for this visit.    Allergies  Allergen Reactions  . Codeine Nausea And Vomiting    Family History  Problem Relation Age of Onset  . Arthritis Mother   . Heart disease Mother   . Stroke Mother   . Hypertension Mother   . Alcohol abuse Father   . Alcohol abuse Sister   . Mental illness Sister   . Alcohol abuse Brother   . Drug abuse Brother   . Arthritis Maternal Grandmother   . Stroke Maternal Grandmother   . Colon cancer Maternal Grandfather     Social History   Social History  . Marital Status: Married    Spouse Name: N/A  . Number of Children: N/A  . Years of Education: N/A   Occupational History  . Not on file.   Social History Main Topics  . Smoking status: Never Smoker   . Smokeless tobacco: Never Used  . Alcohol Use: No  . Drug Use: No  . Sexual Activity: Not Currently   Other Topics Concern  . Not on file   Social History Narrative     Constitutional: Denies fever, malaise, fatigue, headache or abrupt weight changes.  HEENT: Denies eye pain, eye redness, ear pain, ringing in the ears, wax buildup, runny nose, nasal congestion, bloody nose, or sore throat. Respiratory: Denies difficulty breathing, shortness of breath, cough or sputum production.   Cardiovascular: Denies chest pain, chest tightness, palpitations or swelling in the hands or feet.  Gastrointestinal: Denies abdominal pain, bloating, constipation, diarrhea or blood in the stool.  Musculoskeletal: Pt reports joint pain in hands. Denies decrease in range of motion, difficulty with gait, muscle pain or joint swelling.  Psych: Denies anxiety, depression, SI/HI.   No other specific complaints in a complete review of systems (except as listed in  HPI above).  Objective:  BP 126/78 mmHg  Pulse 74  Temp(Src) 98.2 F (36.8 C) (Oral)  Ht 5\' 3"  (1.6 m)  Wt 257 lb (116.574 kg)  BMI 45.54 kg/m2  SpO2 98%  General: Appears  her stated age, obese in NAD. HEENT: Head: normal shape and size; Eyes: sclera white, no icterus, conjunctiva pink, PERRLA and EOMs intact; Left Ear: Tm's gray and intact, normal light reflex; Right Ear: cerumen impaction. Throat/Mouth: Teeth present, mucosa pink and moist, no exudate, lesions or ulcerations noted.  Cardiovascular: Normal rate and rhythm. S1,S2 noted.  No murmur, rubs or gallops noted. No JVD or BLE edema. No carotid bruits noted. Pulmonary/Chest: Normal effort and positive vesicular breath sounds. No respiratory distress. No wheezes, rales or ronchi noted.  Abdomen: Soft and nontender. Normal bowel sounds. No distention or masses noted. Liver, spleen and kidneys non palpable. Musculoskeletal: Joint enlargement noted in hands bilaterally. Normal range of motion. No difficulty with gait.    Assessment and Plan:

## 2015-12-13 NOTE — Patient Instructions (Signed)

## 2015-12-13 NOTE — Assessment & Plan Note (Signed)
Continue Aleve prn

## 2015-12-13 NOTE — Assessment & Plan Note (Signed)
Continue Rhinocort prn 

## 2015-12-13 NOTE — Assessment & Plan Note (Signed)
Continue Prevacid She is due for upper endoscopy-planning to schedule after the first of the year

## 2015-12-13 NOTE — Addendum Note (Signed)
Addended by: [name] on: 12/13/2015 02:14 PM   Modules accepted: Miquel Dunn

## 2015-12-13 NOTE — Assessment & Plan Note (Signed)
Refilled Albuterol

## 2015-12-13 NOTE — Assessment & Plan Note (Signed)
She is not interested in taking a statin Advised her to consume a low fat, low cholesterol diet Start a baby ASA daily

## 2016-02-22 ENCOUNTER — Encounter: Payer: Self-pay | Admitting: Internal Medicine

## 2016-02-22 ENCOUNTER — Ambulatory Visit (INDEPENDENT_AMBULATORY_CARE_PROVIDER_SITE_OTHER): Payer: Managed Care, Other (non HMO) | Admitting: Internal Medicine

## 2016-02-22 VITALS — BP 124/78 | HR 87 | Temp 98.5°F | Wt 259.0 lb

## 2016-02-22 DIAGNOSIS — J329 Chronic sinusitis, unspecified: Secondary | ICD-10-CM

## 2016-02-22 DIAGNOSIS — B349 Viral infection, unspecified: Secondary | ICD-10-CM

## 2016-02-22 DIAGNOSIS — B9789 Other viral agents as the cause of diseases classified elsewhere: Secondary | ICD-10-CM

## 2016-02-22 MED ORDER — PREDNISONE 10 MG PO TABS: ORAL_TABLET | ORAL | Status: DC

## 2016-02-22 NOTE — Progress Notes (Signed)
HPI  Pt presents to the clinic today with c/o facial pain and pressure, ear fullness, nasal congestion, sore throat and cough. This started 3 days ago. She is blowing yellow/Barrick mucous out of her nose. She does have associated chest tightness and wheezing. She denies fever, chills or body aches. She has taken Tylenol Sinus and Albuterol with some relief. She does have a history of asthma and allergies. She has had sick contacts. She does not take her flu shot.  Review of Systems    Past Medical History  Diagnosis Date  . Arthritis   . Chicken pox   . Asthma   . GERD (gastroesophageal reflux disease)   . Allergy   . Hypertension   . History of colon polyps   . Barrett esophagus     Family History  Problem Relation Age of Onset  . Arthritis Mother   . Heart disease Mother   . Stroke Mother   . Hypertension Mother   . Alcohol abuse Father   . Alcohol abuse Sister   . Mental illness Sister   . Alcohol abuse Brother   . Drug abuse Brother   . Arthritis Maternal Grandmother   . Stroke Maternal Grandmother   . Colon cancer Maternal Grandfather     Social History   Social History  . Marital Status: Married    Spouse Name: N/A  . Number of Children: N/A  . Years of Education: N/A   Occupational History  . Not on file.   Social History Main Topics  . Smoking status: Never Smoker   . Smokeless tobacco: Never Used  . Alcohol Use: No  . Drug Use: No  . Sexual Activity: Not Currently   Other Topics Concern  . Not on file   Social History Narrative    Allergies  Allergen Reactions  . Codeine Nausea And Vomiting     Constitutional: Positive headache. Denies fatigue, fever or abrupt weight changes.  HEENT:  Positive facial pain, nasal congestion and sore throat. Denies eye redness, ear pain, ringing in the ears, wax buildup, runny nose or bloody nose. Respiratory: Positive cough. Denies difficulty breathing or shortness of breath.  Cardiovascular: Denies chest  pain, chest tightness, palpitations or swelling in the hands or feet.   No other specific complaints in a complete review of systems (except as listed in HPI above).  Objective:  BP 124/78 mmHg  Pulse 87  Temp(Src) 98.5 F (36.9 C) (Oral)  Wt 259 lb (117.482 kg)  SpO2 97%  General: Appears her stated age,  in NAD. HEENT: Head: normal shape and size, no sinus tenderness noted; Eyes: sclera white, no icterus, conjunctiva pink; Ears: Tm's gray and intact, normal light reflex; Nose: mucosa pink and moist, septum midline; Throat/Mouth: + PND. Teeth present, mucosa erythematous and moist, no exudate noted, no lesions or ulcerations noted.  Neck:  No cervical adenopathy noted.  Cardiovascular: Normal rate and rhythm. S1,S2 noted.  No murmur, rubs or gallops noted.  Pulmonary/Chest: Normal effort and diminished vesicular breath sounds. No respiratory distress. No wheezes, rales or ronchi noted.      Assessment & Plan:   Acute viral sinusitis  Can use a Neti Pot which can be purchased from your local drug store. Flonase 2 sprays each nostril for 3 days and then as needed. eRx for Pred Taper x 6 days If symptoms persist, consider treatment with Augmentin (if not feeling better by Monday) If chest tightness/wheezing persist- consider daily inhaled ICS  RTC as needed  or if symptoms persist.

## 2016-02-22 NOTE — Patient Instructions (Signed)

## 2016-02-22 NOTE — Progress Notes (Signed)
Pre visit review using our clinic review tool, if applicable. No additional management support is needed unless otherwise documented below in the visit note. 

## 2016-02-24 ENCOUNTER — Other Ambulatory Visit: Payer: Self-pay | Admitting: Internal Medicine

## 2016-02-24 ENCOUNTER — Telehealth: Payer: Self-pay

## 2016-02-24 MED ORDER — AMOXICILLIN 500 MG PO CAPS: 500.0000 mg | ORAL_CAPSULE | Freq: Three times a day (TID) | ORAL | Status: DC

## 2016-02-24 NOTE — Telephone Encounter (Signed)
Amoxil sent to pharmacy

## 2016-02-24 NOTE — Telephone Encounter (Signed)
Pt is aware as instructed 

## 2016-02-24 NOTE — Telephone Encounter (Signed)
Pt left v/m; pt was seen 02/22/16; pt does not want to go thru weekend without abx; mucus is heavier and darker, sinus pressure is worse. Pt request abx and cb when med sent to [facility] [address].

## 2016-02-28 ENCOUNTER — Other Ambulatory Visit: Payer: Self-pay | Admitting: Internal Medicine

## 2016-06-11 ENCOUNTER — Telehealth: Payer: Self-pay

## 2016-06-11 NOTE — Telephone Encounter (Signed)
error 

## 2016-06-12 ENCOUNTER — Ambulatory Visit (INDEPENDENT_AMBULATORY_CARE_PROVIDER_SITE_OTHER): Payer: Managed Care, Other (non HMO) | Admitting: Internal Medicine

## 2016-06-12 ENCOUNTER — Ambulatory Visit (INDEPENDENT_AMBULATORY_CARE_PROVIDER_SITE_OTHER)
Admission: RE | Admit: 2016-06-12 | Discharge: 2016-06-12 | Disposition: A | Discharge status: Home / Self Care | Payer: Managed Care, Other (non HMO) | Source: Ambulatory Visit | Attending: Internal Medicine | Admitting: Internal Medicine

## 2016-06-12 ENCOUNTER — Encounter: Payer: Self-pay | Admitting: Internal Medicine

## 2016-06-12 VITALS — BP 122/82 | HR 94 | Temp 98.3°F | Wt 264.0 lb

## 2016-06-12 DIAGNOSIS — M79641 Pain in right hand: Secondary | ICD-10-CM | POA: Diagnosis not present

## 2016-06-12 DIAGNOSIS — M25449 Effusion, unspecified hand: Secondary | ICD-10-CM

## 2016-06-12 DIAGNOSIS — M25562 Pain in left knee: Secondary | ICD-10-CM | POA: Diagnosis not present

## 2016-06-12 DIAGNOSIS — M79642 Pain in left hand: Secondary | ICD-10-CM | POA: Diagnosis not present

## 2016-06-12 DIAGNOSIS — M25441 Effusion, right hand: Secondary | ICD-10-CM | POA: Diagnosis not present

## 2016-06-12 LAB — RHEUMATOID FACTOR

## 2016-06-12 NOTE — Progress Notes (Signed)
Subjective:    Patient ID: Patricia Garner, female    DOB: [birth year], 60 y.o.   MRN: [id number]  HPI  Pt presents to the clinic today with c/o bilateral hand pain and swelling. This has been going on for years but has gotten worse in the last few months. There is associated stiffness and weakness. She has taken Ibuprofen with minimal relief. She is concerned that she may have rheumatoid arthritis. Her grandmother had RA. She would like a referral to rheumatology.  She also c/o left knee pain. She describes the pain as achy. She feels like her left knee is unstable and is popping in and out of place. She denies any injury to the area. She has taken Ibuprofen with minimal relief. She would like a referral to ortho.  Review of Systems      Past Medical History  Diagnosis Date  . Arthritis   . Chicken pox   . Asthma   . GERD (gastroesophageal reflux disease)   . Allergy   . Hypertension   . History of colon polyps   . Barrett esophagus     Current Outpatient Prescriptions  Medication Sig Dispense Refill  . albuterol (PROVENTIL HFA;VENTOLIN HFA) 108 (90 BASE) MCG/ACT inhaler Inhale 1-2 puffs into the lungs every 6 (six) hours as needed for wheezing or shortness of breath. 1 Inhaler 0  . Calcium-Vitamin D 600-200 MG-UNIT tablet Take by mouth.    . Cholecalciferol (VITAMIN D3) 1000 units CAPS Take 1 capsule by mouth daily.     . CVS ZINC 50 MG TABS Take by mouth.    . ferrous sulfate 325 (65 FE) MG EC tablet Take by mouth.    . lansoprazole (PREVACID) 15 MG capsule Take by mouth.    Marland Kitchen lisinopril-hydrochlorothiazide (PRINZIDE,ZESTORETIC) 20-25 MG tablet TAKE ONE TABLET BY MOUTH ONCE DAILY 90 tablet 1  . Multiple Vitamins-Minerals (MULTIVITAMIN WITH MINERALS) tablet Take by mouth.    . nystatin-triamcinolone ointment (MYCOLOG) Apply 1 application topically as needed.     No current facility-administered medications for this visit.    Allergies  Allergen Reactions  . Codeine Nausea  And Vomiting    Family History  Problem Relation Age of Onset  . Arthritis Mother   . Heart disease Mother   . Stroke Mother   . Hypertension Mother   . Alcohol abuse Father   . Alcohol abuse Sister   . Mental illness Sister   . Alcohol abuse Brother   . Drug abuse Brother   . Arthritis Maternal Grandmother   . Stroke Maternal Grandmother   . Colon cancer Maternal Grandfather     Social History   Social History  . Marital Status: Married    Spouse Name: N/A  . Number of Children: N/A  . Years of Education: N/A   Occupational History  . Not on file.   Social History Main Topics  . Smoking status: Never Smoker   . Smokeless tobacco: Never Used  . Alcohol Use: No  . Drug Use: No  . Sexual Activity: Not Currently   Other Topics Concern  . Not on file   Social History Narrative     Constitutional: Denies fever, malaise, fatigue, headache or abrupt weight changes.  Respiratory: Denies difficulty breathing, shortness of breath, cough or sputum production.   Cardiovascular: Denies chest pain, chest tightness, palpitations or swelling in the hands or feet.  Musculoskeletal: Pt reports left knee pain, joint pain and swelling in hands. Denies decrease in range  of motion, difficulty with gait, muscle pain.     No other specific complaints in a complete review of systems (except as listed in HPI above).  Objective:   Physical Exam    BP 122/82 mmHg  Pulse 94  Temp(Src) 98.3 F (36.8 C) (Oral)  Wt 264 lb (119.75 kg)  SpO2 97% Wt Readings from Last 3 Encounters:  06/12/16 264 lb (119.75 kg)  02/22/16 259 lb (117.482 kg)  12/13/15 257 lb (116.574 kg)    General: Appears her stated age, obese in NAD. Musculoskeletal: Normal flexion and extension of the left knee. No joint swelling noted. Pain with palpation of the medial joint line. Herbeden's nodes noted bilaterally. No difficulty with gait.    BMET    Component Value Date/Time   NA 139 12/06/2015 0905    K 3.8 12/06/2015 0905   CL 102 12/06/2015 0905   CO2 27 12/06/2015 0905   GLUCOSE 114* 12/06/2015 0905   BUN 19 12/06/2015 0905   CREATININE 0.69 12/06/2015 0905   CALCIUM 9.7 12/06/2015 0905    Lipid Panel     Component Value Date/Time   CHOL 224* 12/06/2015 0905   TRIG 165.0* 12/06/2015 0905   HDL 38.40* 12/06/2015 0905   CHOLHDL 6 12/06/2015 0905   VLDL 33.0 12/06/2015 0905   LDLCALC 152* 12/06/2015 0905    CBC    Component Value Date/Time   WBC 8.6 12/06/2015 0905   RBC 4.03 12/06/2015 0905   HGB 12.2 12/06/2015 0905   HCT 36.4 12/06/2015 0905   PLT 345.0 12/06/2015 0905   MCV 90.3 12/06/2015 0905   MCHC 33.5 12/06/2015 0905   RDW 13.5 12/06/2015 0905    Hgb A1C Lab Results  Component Value Date   HGBA1C 6.0 12/06/2015       Assessment & Plan:   Joint pain and swelling:  Xray right hand today Will check ESR, RF and ANA today Will refer to rheumatology if needed pending xray and labs  Left knee pain:  Xray left knee today Advil as needed Discussed how weight loss would help improve her knee pain  RTC as needed or if symptoms persist or worsen BAITY, REGINA, NP  Will refer to ortho if needed pending xray

## 2016-06-12 NOTE — Patient Instructions (Signed)

## 2016-06-13 LAB — ANA: Anti Nuclear Antibody(ANA): NEGATIVE

## 2016-06-13 LAB — SEDIMENTATION RATE: SED RATE: 15 mm/h (ref 0–30)

## 2016-06-19 ENCOUNTER — Other Ambulatory Visit: Payer: Self-pay | Admitting: Internal Medicine

## 2016-06-19 ENCOUNTER — Telehealth: Payer: Self-pay | Admitting: Internal Medicine

## 2016-06-19 DIAGNOSIS — M25541 Pain in joints of right hand: Secondary | ICD-10-CM

## 2016-06-19 DIAGNOSIS — M25542 Pain in joints of left hand: Secondary | ICD-10-CM

## 2016-06-19 DIAGNOSIS — M25562 Pain in left knee: Secondary | ICD-10-CM

## 2016-06-19 NOTE — Telephone Encounter (Signed)
Pt returned call regarding referral - please call [phone] Thanks

## 2016-06-20 NOTE — Telephone Encounter (Signed)
Please refer to result note.

## 2016-07-10 ENCOUNTER — Telehealth: Payer: Self-pay | Admitting: Internal Medicine

## 2016-07-10 NOTE — Telephone Encounter (Signed)
done

## 2016-07-10 NOTE — Telephone Encounter (Signed)
Pt would like to have disc of her hand xrays to take with her to the orthopedist for her Monday appt. She will be in on Thursday to pick up.   Thank you

## 2016-07-17 DIAGNOSIS — M1712 Unilateral primary osteoarthritis, left knee: Secondary | ICD-10-CM | POA: Insufficient documentation

## 2016-08-27 ENCOUNTER — Encounter: Payer: Self-pay | Admitting: *Deleted

## 2016-08-28 ENCOUNTER — Ambulatory Visit: Payer: Managed Care, Other (non HMO) | Admitting: Anesthesiology

## 2016-08-28 ENCOUNTER — Ambulatory Visit
Admission: RE | Admit: 2016-08-28 | Discharge: 2016-08-28 | Disposition: A | Discharge status: Home / Self Care | Payer: Managed Care, Other (non HMO) | Source: Ambulatory Visit | Attending: Gastroenterology | Admitting: Gastroenterology

## 2016-08-28 ENCOUNTER — Encounter
Admission: RE | Disposition: A | Discharge status: Home / Self Care | Payer: Self-pay | Source: Ambulatory Visit | Attending: Gastroenterology

## 2016-08-28 DIAGNOSIS — K2960 Other gastritis without bleeding: Secondary | ICD-10-CM | POA: Insufficient documentation

## 2016-08-28 DIAGNOSIS — K2270 Barrett's esophagus without dysplasia: Secondary | ICD-10-CM | POA: Insufficient documentation

## 2016-08-28 DIAGNOSIS — I10 Essential (primary) hypertension: Secondary | ICD-10-CM | POA: Insufficient documentation

## 2016-08-28 DIAGNOSIS — M1990 Unspecified osteoarthritis, unspecified site: Secondary | ICD-10-CM | POA: Diagnosis not present

## 2016-08-28 DIAGNOSIS — J45909 Unspecified asthma, uncomplicated: Secondary | ICD-10-CM | POA: Diagnosis not present

## 2016-08-28 DIAGNOSIS — K210 Gastro-esophageal reflux disease with esophagitis: Secondary | ICD-10-CM | POA: Diagnosis not present

## 2016-08-28 DIAGNOSIS — K2980 Duodenitis without bleeding: Secondary | ICD-10-CM | POA: Diagnosis not present

## 2016-08-28 DIAGNOSIS — K5730 Diverticulosis of large intestine without perforation or abscess without bleeding: Secondary | ICD-10-CM | POA: Diagnosis not present

## 2016-08-28 DIAGNOSIS — Z86010 Personal history of colonic polyps: Secondary | ICD-10-CM | POA: Diagnosis not present

## 2016-08-28 HISTORY — PX: COLONOSCOPY WITH PROPOFOL: SHX5780

## 2016-08-28 HISTORY — PX: ESOPHAGOGASTRODUODENOSCOPY (EGD) WITH PROPOFOL: SHX5813

## 2016-08-28 SURGERY — ESOPHAGOGASTRODUODENOSCOPY (EGD) WITH PROPOFOL
Anesthesia: General

## 2016-08-28 MED ORDER — PHENYLEPHRINE HCL 10 MG/ML IJ SOLN
INTRAMUSCULAR | Status: DC | PRN
  Administered 2016-08-28: 100 ug via INTRAVENOUS

## 2016-08-28 MED ORDER — FENTANYL CITRATE (PF) 100 MCG/2ML IJ SOLN
INTRAMUSCULAR | Status: DC | PRN
  Administered 2016-08-28: 50 ug via INTRAVENOUS

## 2016-08-28 MED ORDER — PROPOFOL 500 MG/50ML IV EMUL
INTRAVENOUS | Status: DC | PRN
  Administered 2016-08-28: 150 ug/kg/min via INTRAVENOUS

## 2016-08-28 MED ORDER — SODIUM CHLORIDE 0.9 % IV SOLN
INTRAVENOUS | Status: DC
  Administered 2016-08-28: 13:00:00 via INTRAVENOUS

## 2016-08-28 MED ORDER — MIDAZOLAM HCL 5 MG/5ML IJ SOLN
INTRAMUSCULAR | Status: DC | PRN
  Administered 2016-08-28: 1 mg via INTRAVENOUS

## 2016-08-28 MED ORDER — PROPOFOL 10 MG/ML IV BOLUS
INTRAVENOUS | Status: DC | PRN
  Administered 2016-08-28: 100 mg via INTRAVENOUS

## 2016-08-28 MED ORDER — LIDOCAINE 2% (20 MG/ML) 5 ML SYRINGE
INTRAMUSCULAR | Status: DC | PRN
  Administered 2016-08-28: 40 mg via INTRAVENOUS

## 2016-08-28 NOTE — Transfer of Care (Signed)
Immediate Anesthesia Transfer of Care Note  Patient: Patricia Garner  Procedure(s) Performed: Procedure(s): ESOPHAGOGASTRODUODENOSCOPY (EGD) WITH PROPOFOL (N/A) COLONOSCOPY WITH PROPOFOL (N/A)  Patient Location: PACU and Endoscopy Unit  Anesthesia Type:General  Level of Consciousness: sedated  Airway & Oxygen Therapy: Patient Spontanous Breathing and Patient connected to nasal cannula oxygen  Post-op Assessment: Report given to RN and Post -op Vital signs reviewed and stable  Post vital signs: Reviewed and stable  Last Vitals:  Vitals:   08/28/16 1214  BP: 134/78  Pulse: 81  Resp: 19  Temp: 36.9 C    Last Pain:  Vitals:   08/28/16 1214  TempSrc: Tympanic         Complications: No apparent anesthesia complications

## 2016-08-28 NOTE — Anesthesia Postprocedure Evaluation (Signed)
Anesthesia Post Note  Patient: TAWANNA MUKHOPADHYAY  Procedure(s) Performed: Procedure(s) (LRB): ESOPHAGOGASTRODUODENOSCOPY (EGD) WITH PROPOFOL (N/A) COLONOSCOPY WITH PROPOFOL (N/A)  Patient location during evaluation: PACU Anesthesia Type: General Level of consciousness: awake and alert and oriented Pain management: pain level controlled Vital Signs Assessment: post-procedure vital signs reviewed and stable Respiratory status: spontaneous breathing Cardiovascular status: blood pressure returned to baseline Anesthetic complications: no    Last Vitals:  Vitals:   08/28/16 1430 08/28/16 1440  BP: (!) 122/59 (!) 115/53  Pulse: 79 79  Resp: 15 (!) 22  Temp:      Last Pain:  Vitals:   08/28/16 1411  TempSrc: Tympanic                 Salene Mohamud

## 2016-08-28 NOTE — Op Note (Signed)
[facility] Patient Name: Patricia Garner Procedure Date: 08/28/2016 1:19 PM MRN: [id number] Account #: [id number] Date of Birth: 05-25-1956 Admit Type: Outpatient Age: 60 Room: [facility] ENDO ROOM 3 Gender: Female Note Status: Finalized Procedure:            Colonoscopy Indications:          Personal history of colonic polyps Providers:            Lollie Sails, MD Referring MD:         [name] (Referring MD) Medicines:            Monitored Anesthesia Care Complications:        No immediate complications. Procedure:            Pre-Anesthesia Assessment:                       - ASA Grade Assessment: III - A patient with severe                        systemic disease.                       After obtaining informed consent, the colonoscope was                        passed under direct vision. Throughout the procedure,                        the patient's blood pressure, pulse, and oxygen                        saturations were monitored continuously. The                        Colonoscope was introduced through the anus and                        advanced to the the cecum, identified by appendiceal                        orifice and ileocecal valve. The colonoscopy was                        performed without difficulty. The patient tolerated the                        procedure well. The quality of the bowel preparation                        was good. Findings:      A few medium-mouthed diverticula were found in the sigmoid colon.      The retroflexed view of the distal rectum and anal verge was normal and       showed no anal or rectal abnormalities.      The digital rectal exam was normal. Impression:           - Diverticulosis in the sigmoid colon.                       - The distal rectum and anal verge are normal on  retroflexion view.                       - No specimens collected. Recommendation:       -  Discharge patient to home.                       - Repeat colonoscopy in 5 years for surveillance. Procedure Code(s):    --- Professional ---                       [phone], Colonoscopy, flexible; diagnostic, including                        collection of specimen(s) by brushing or washing, when                        performed (separate procedure) Diagnosis Code(s):    --- Professional ---                       Z86.010, Personal history of colonic polyps                       K57.30, Diverticulosis of large intestine without                        perforation or abscess without bleeding CPT copyright 2016 American Medical Association. All rights reserved. The codes documented in this report are preliminary and upon coder review may  be revised to meet current compliance requirements. Lollie Sails, MD 08/28/2016 2:05:04 PM This report has been signed electronically. Number of Addenda: 0 Note Initiated On: 08/28/2016 1:19 PM Scope Withdrawal Time: 0 hours 8 minutes 47 seconds  Total Procedure Duration: 0 hours 16 minutes 1 second       [facility]

## 2016-08-28 NOTE — H&P (Signed)
Outpatient short stay form Pre-procedure 08/28/2016 1:14 PM Patricia Sails MD  Primary Physician: Webb Silversmith NP  Reason for visit:  EGD and colonoscopy  History of present illness:  Patient is a 60 year old female presenting today as above. She has a personal history of adenomatous colon polyps as well as Barrett's esophagus. Her last colonoscopy was in 2010 and last EGD in 2012. He does take a proton pump inhibitor, Prevacid. He tolerated her prep well however states it might be some sediment left. She denies use of any aspirin products or blood thinning agents.    Current Facility-Administered Medications:  .  0.9 %  sodium chloride infusion, , Intravenous, Continuous, Patricia Sails, MD, Last Rate: 20 mL/hr at 08/28/16 1238  Prescriptions Prior to Admission  Medication Sig Dispense Refill Last Dose  . lisinopril-hydrochlorothiazide (PRINZIDE,ZESTORETIC) 20-25 MG tablet TAKE ONE TABLET BY MOUTH ONCE DAILY 90 tablet 1 08/28/2016 at 0600  . albuterol (PROVENTIL HFA;VENTOLIN HFA) 108 (90 BASE) MCG/ACT inhaler Inhale 1-2 puffs into the lungs every 6 (six) hours as needed for wheezing or shortness of breath. 1 Inhaler 0 Taking  . Calcium-Vitamin D 600-200 MG-UNIT tablet Take by mouth.   Taking  . Cholecalciferol (VITAMIN D3) 1000 units CAPS Take 1 capsule by mouth daily.    Taking  . CVS ZINC 50 MG TABS Take by mouth.   Taking  . ferrous sulfate 325 (65 FE) MG EC tablet Take by mouth.   Taking  . lansoprazole (PREVACID) 15 MG capsule Take by mouth.   Taking  . Multiple Vitamins-Minerals (MULTIVITAMIN WITH MINERALS) tablet Take by mouth.   Taking  . nystatin-triamcinolone ointment (MYCOLOG) Apply 1 application topically as needed.   Taking     Allergies  Allergen Reactions  . Codeine Nausea And Vomiting     Past Medical History:  Diagnosis Date  . Allergy   . Arthritis   . Asthma   . Barrett esophagus   . Chicken pox   . GERD (gastroesophageal reflux disease)   . History of  colon polyps   . Hypertension     Review of systems:      Physical Exam    Heart and lungs: Regular rate and rhythm without rub or gallop, lungs are bilaterally clear.    HEENT: Norm cephalic atraumatic eyes are anicteric    Other:     Pertinant exam for procedure: Soft nontender nondistended bowel sounds positive normoactive.    Planned proceedures: EGD, colonoscopy and indicated procedures. I have discussed the risks benefits and complications of procedures to include not limited to bleeding, infection, perforation and the risk of sedation and the patient wishes to proceed.    Patricia Sails, MD Gastroenterology 08/28/2016  1:14 PM

## 2016-08-28 NOTE — Anesthesia Preprocedure Evaluation (Signed)
Anesthesia Evaluation  Patient identified by MRN, date of birth, ID band Patient awake    Reviewed: Allergy & Precautions, H&P , NPO status , Patient's Chart, lab work & pertinent test results, reviewed documented beta blocker date and time   Airway Mallampati: II   Neck ROM: full    Dental  (+) Poor Dentition, Teeth Intact   Pulmonary neg pulmonary ROS,    Pulmonary exam normal        Cardiovascular hypertension, negative cardio ROS Normal cardiovascular exam Rhythm:regular Rate:Normal     Neuro/Psych negative neurological ROS  negative psych ROS   GI/Hepatic negative GI ROS, Neg liver ROS, GERD  Medicated,  Endo/Other  negative endocrine ROSMorbid obesity  Renal/GU negative Renal ROS  negative genitourinary   Musculoskeletal   Abdominal   Peds  Hematology negative hematology ROS (+)   Anesthesia Other Findings Past Medical History: No date: Allergy No date: Arthritis No date: Asthma No date: Barrett esophagus No date: Chicken pox No date: GERD (gastroesophageal reflux disease) No date: History of colon polyps No date: Hypertension Past Surgical History: 1983: CHOLECYSTECTOMY 1969: TONSILLECTOMY BMI    Body Mass Index:  45.17 kg/m     Reproductive/Obstetrics                             Anesthesia Physical Anesthesia Plan  ASA: III  Anesthesia Plan: General   Post-op Pain Management:    Induction:   Airway Management Planned:   Additional Equipment:   Intra-op Plan:   Post-operative Plan:   Informed Consent: I have reviewed the patients History and Physical, chart, labs and discussed the procedure including the risks, benefits and alternatives for the proposed anesthesia with the patient or authorized representative who has indicated his/her understanding and acceptance.   Dental Advisory Given  Plan Discussed with: CRNA  Anesthesia Plan Comments:          Anesthesia Quick Evaluation

## 2016-08-28 NOTE — Op Note (Signed)
[facility] Gastroenter[facility] Patient Name: Patricia Garner Procedure Date: 08/28/2016 1:20 PM MRN: [id number] Account #: [id number] Date of Birth: 11-17-1956 Admit Type: Outpatient Age: 60 Room: [facility] ENDO ROOM 3 Gender: Female Note Status: Finalized Procedure:            Upper GI endoscopy Indications:          Follow-up of Barrett's esophagus Providers:            Lollie Sails, MD Referring MD:         [name] (Referring MD) Medicines:            Monitored Anesthesia Care Complications:        No immediate complications. Procedure:            Pre-Anesthesia Assessment:                       - ASA Grade Assessment: III - A patient with severe                        systemic disease.                       After obtaining informed consent, the endoscope was                        passed under direct vision. Throughout the procedure,                        the patient's blood pressure, pulse, and oxygen                        saturations were monitored continuously. The Endoscope                        was introduced through the mouth, and advanced to the                        third part of duodenum. The upper GI endoscopy was                        accomplished without difficulty. The patient tolerated                        the procedure well. The patient tolerated the procedure                        well. Findings:      There were esophageal mucosal changes secondary to established       short-segment Barrett's disease present at the gastroesophageal       junction. The maximum longitudinal extent of these mucosal changes was 1       cm in length. Mucosa was biopsied with a cold forceps for histology in 4       quadrants.      The exam of the esophagus was otherwise normal.      Localized, segmental and patchy moderate inflammation characterized by       congestion (edema), erosions, erythema and linear erosions was found in       the gastric body  and in the gastric antrum. Biopsies were taken with a  cold forceps for histology. Biopsies were taken with a cold forceps for       Helicobacter pylori testing.      Patchy mild inflammation characterized by congestion (edema) and       erythema was found in the duodenal bulb and in the second portion of the       duodenum.      The cardia and gastric fundus were normal on retroflexion. Impression:           - Esophageal mucosal changes secondary to established                        short-segment Barrett's disease. Biopsied.                       - Erosive gastritis. Biopsied.                       - Erosive duodenitis. Recommendation:       - Discharge patient to home.                       - Use Protonix (pantoprazole) 40 mg PO daily daily. Procedure Code(s):    --- Professional ---                       [phone], Esophagogastroduodenoscopy, flexible, transoral;                        with biopsy, single or multiple Diagnosis Code(s):    --- Professional ---                       K22.70, Barrett's esophagus without dysplasia                       K29.60, Other gastritis without bleeding                       K29.80, Duodenitis without bleeding CPT copyright 2016 American Medical Association. All rights reserved. The codes documented in this report are preliminary and upon coder review may  be revised to meet current compliance requirements. Lollie Sails, MD 08/28/2016 1:42:58 PM This report has been signed electronically. Number of Addenda: 0 Note Initiated On: 08/28/2016 1:20 PM      [facility]

## 2016-08-29 ENCOUNTER — Encounter: Payer: Self-pay | Admitting: Gastroenterology

## 2016-08-30 DIAGNOSIS — D239 Other benign neoplasm of skin, unspecified: Secondary | ICD-10-CM

## 2016-08-30 HISTORY — DX: Other benign neoplasm of skin, unspecified: D23.9

## 2016-08-30 LAB — SURGICAL PATHOLOGY

## 2016-09-06 ENCOUNTER — Other Ambulatory Visit: Payer: Self-pay | Admitting: Internal Medicine

## 2016-10-14 ENCOUNTER — Other Ambulatory Visit: Payer: Self-pay | Admitting: Internal Medicine

## 2016-10-14 DIAGNOSIS — J4520 Mild intermittent asthma, uncomplicated: Secondary | ICD-10-CM

## 2016-12-13 ENCOUNTER — Encounter: Payer: Self-pay | Admitting: Internal Medicine

## 2016-12-13 ENCOUNTER — Other Ambulatory Visit: Payer: Self-pay | Admitting: Internal Medicine

## 2016-12-13 ENCOUNTER — Ambulatory Visit (INDEPENDENT_AMBULATORY_CARE_PROVIDER_SITE_OTHER): Payer: Managed Care, Other (non HMO) | Admitting: Internal Medicine

## 2016-12-13 ENCOUNTER — Encounter (INDEPENDENT_AMBULATORY_CARE_PROVIDER_SITE_OTHER): Payer: Self-pay

## 2016-12-13 VITALS — BP 126/70 | HR 92 | Temp 97.8°F | Ht 63.0 in | Wt 259.0 lb

## 2016-12-13 DIAGNOSIS — Z1159 Encounter for screening for other viral diseases: Secondary | ICD-10-CM

## 2016-12-13 DIAGNOSIS — Z0000 Encounter for general adult medical examination without abnormal findings: Secondary | ICD-10-CM | POA: Diagnosis not present

## 2016-12-13 DIAGNOSIS — K22719 Barrett's esophagus with dysplasia, unspecified: Secondary | ICD-10-CM

## 2016-12-13 DIAGNOSIS — M1990 Unspecified osteoarthritis, unspecified site: Secondary | ICD-10-CM

## 2016-12-13 DIAGNOSIS — E7800 Pure hypercholesterolemia, unspecified: Secondary | ICD-10-CM

## 2016-12-13 DIAGNOSIS — J302 Other seasonal allergic rhinitis: Secondary | ICD-10-CM | POA: Diagnosis not present

## 2016-12-13 DIAGNOSIS — Z114 Encounter for screening for human immunodeficiency virus [HIV]: Secondary | ICD-10-CM | POA: Diagnosis not present

## 2016-12-13 DIAGNOSIS — J4520 Mild intermittent asthma, uncomplicated: Secondary | ICD-10-CM

## 2016-12-13 DIAGNOSIS — I10 Essential (primary) hypertension: Secondary | ICD-10-CM | POA: Diagnosis not present

## 2016-12-13 LAB — HEMOGLOBIN A1C: Hgb A1c MFr Bld: 6.1 % (ref 4.6–6.5)

## 2016-12-13 LAB — COMPREHENSIVE METABOLIC PANEL
ALBUMIN: 4.6 g/dL (ref 3.5–5.2)
ALT: 29 U/L (ref 0–35)
AST: 19 U/L (ref 0–37)
Alkaline Phosphatase: 74 U/L (ref 39–117)
BUN: 21 mg/dL (ref 6–23)
CHLORIDE: 100 meq/L (ref 96–112)
CO2: 32 mEq/L (ref 19–32)
Calcium: 10.1 mg/dL (ref 8.4–10.5)
Creatinine, Ser: 0.68 mg/dL (ref 0.40–1.20)
GFR: 94.1 mL/min (ref >60.00)
Glucose, Bld: 99 mg/dL (ref 70–99)
POTASSIUM: 3.7 meq/L (ref 3.5–5.1)
SODIUM: 139 meq/L (ref 135–145)
Total Bilirubin: 0.4 mg/dL (ref 0.2–1.2)
Total Protein: 7.2 g/dL (ref 6.0–8.3)

## 2016-12-13 LAB — LIPID PANEL
CHOL/HDL RATIO: 5
CHOLESTEROL: 237 mg/dL — AB (ref 0–200)
HDL: 45.9 mg/dL (ref >39.00)
NonHDL: 191.51
Triglycerides: 229 mg/dL — ABNORMAL HIGH (ref 0.0–149.0)
VLDL: 45.8 mg/dL — AB (ref 0.0–40.0)

## 2016-12-13 LAB — LDL CHOLESTEROL, DIRECT: Direct LDL: 155 mg/dL

## 2016-12-13 NOTE — Assessment & Plan Note (Signed)
Encouraged weight loss to reduce strain on joints Advised her to try not to take Ibuprofen due to barretts  esophagus

## 2016-12-13 NOTE — Progress Notes (Signed)
Subjective:    Patient ID: Patricia Garner, female    DOB: 09-18-1956, 60 y.o.   MRN: [id number]  HPI  Pt presents to the clinic today for her annual exam. She is also due to follow up chronic conditions.  Seasonal Allergies: Worse in the spring and fall. She takesTylenol sinus as needed with good relief.  Arthritis: Mainly in her back, hands and knees. She follows with a Restaurant manager, fast food. She takes Advil sparingly with good relief.  Asthma: She has been been feeling more wheezy lately. She has been using her inhaler 3-4 times per week. She denies cough or shortness of breath.  GERD with Barrett's Esophagus: She has good relief with Protonix. She does not have breakthrough symptoms. Last upper GI was 07/2016.  HTN: She is taking Lisinopril-HCTZ as prescribed. She has noticed some cramping in her calves. Her BP today is 126/70. There is no ECG of file.  HLD: Her last LDL was 152, 12/2015. She refused statin therapy at that time. She has been trying to consume a low fat diet.  Flu: never Tetanus: close to 10 years ago Pap Smear: 11/2015- normal at [facility]: 11/2015 Bone Density Exam: She reports she had this with Dr. Chancy Milroy, years ago Colon Screening: 07/2016 Vision Screening annually Dentist: annually  Diet: She does eat lean meat. She eats more veggies than fruits. She does not eat a lot of fried foods. She drinks mostly water.  Exercise: None  Review of Systems      Past Medical History:  Diagnosis Date  . Allergy   . Arthritis   . Asthma   . Barrett esophagus   . Chicken pox   . GERD (gastroesophageal reflux disease)   . History of colon polyps   . Hypertension     Current Outpatient Prescriptions  Medication Sig Dispense Refill  . Calcium-Vitamin D 600-200 MG-UNIT tablet Take by mouth.    . Cholecalciferol (VITAMIN D3) 1000 units CAPS Take 1 capsule by mouth daily.     . CVS ZINC 50 MG TABS Take by mouth.    . ferrous sulfate 325 (65 FE) MG EC tablet Take by  mouth.    Marland Kitchen lisinopril-hydrochlorothiazide (PRINZIDE,ZESTORETIC) 20-25 MG tablet TAKE ONE TABLET BY MOUTH ONCE DAILY 90 tablet 0  . Multiple Vitamins-Minerals (MULTIVITAMIN WITH MINERALS) tablet Take by mouth.    . nystatin-triamcinolone ointment (MYCOLOG) Apply 1 application topically as needed.    . pantoprazole (PROTONIX) 40 MG tablet Take 1 tablet by mouth daily.    Marland Kitchen PROVENTIL HFA 108 (90 Base) MCG/ACT inhaler INHALE ONE TO TWO PUFFS BY MOUTH EVERY 6 HOURS AS NEEDED FOR WHEEZING OR SHORTNESS OF BREATH 3 Inhaler 0   No current facility-administered medications for this visit.     Allergies  Allergen Reactions  . Codeine Nausea And Vomiting  . Hydrocodone Hives    Family History  Problem Relation Age of Onset  . Arthritis Mother   . Heart disease Mother   . Stroke Mother   . Hypertension Mother   . Alcohol abuse Father   . Alcohol abuse Sister   . Mental illness Sister   . Alcohol abuse Brother   . Drug abuse Brother   . Arthritis Maternal Grandmother   . Stroke Maternal Grandmother   . Colon cancer Maternal Grandfather     Social History   Social History  . Marital status: Married    Spouse name: N/A  . Number of children: N/A  . Years of education:  N/A   Occupational History  . Not on file.   Social History Main Topics  . Smoking status: Never Smoker  . Smokeless tobacco: Never Used  . Alcohol use No  . Drug use: No  . Sexual activity: Not Currently   Other Topics Concern  . Not on file   Social History Narrative  . No narrative on file     Constitutional: Denies fever, malaise, fatigue, headache or abrupt weight changes.  HEENT: Denies eye pain, eye redness, ear pain, ringing in the ears, wax buildup, runny nose, nasal congestion, bloody nose, or sore throat. Respiratory: Pt reports intermittent wheezing. Denies difficulty breathing, shortness of breath, cough or sputum production.   Cardiovascular: Denies chest pain, chest tightness, palpitations or  swelling in the hands or feet.  Gastrointestinal: Denies abdominal pain, bloating, constipation, diarrhea or blood in the stool.  GU: Denies urgency, frequency, pain with urination, burning sensation, blood in urine, odor or discharge. Musculoskeletal: Denies decrease in range of motion, difficulty with gait, muscle pain or joint pain and swelling.  Skin: Pt reports intermittent rashes. Denies redness, lesions or ulcercations.  Neurological: Denies dizziness, difficulty with memory, difficulty with speech or problems with balance and coordination.  Psych: Denies anxiety, depression, SI/HI.  No other specific complaints in a complete review of systems (except as listed in HPI above).  Objective:   Physical Exam  BP 126/70   Pulse 92   Temp 97.8 F (36.6 C) (Oral)   Ht 5\' 3"  (1.6 m)   Wt 259 lb (117.5 kg)   SpO2 99%   BMI 45.88 kg/m  Wt Readings from Last 3 Encounters:  12/13/16 259 lb (117.5 kg)  08/28/16 255 lb (115.7 kg)  06/12/16 264 lb (119.7 kg)    General: Appears her stated age, obese in NAD. Skin: Warm, dry and intact. No rashes noted today. HEENT: Head: normal shape and size; Eyes: sclera white, no icterus, conjunctiva pink, PERRLA and EOMs intact; Ears: Tm's gray and intact, normal light reflex; Throat/Mouth: Teeth present, mucosa pink and moist, no exudate, lesions or ulcerations noted.  Neck:  Neck supple, trachea midline. No masses, lumps or thyromegaly present.  Cardiovascular: Normal rate and rhythm. S1,S2 noted.  No murmur, rubs or gallops noted. No carotid bruits noted. Pulmonary/Chest: Normal effort and positive vesicular breath sounds. No respiratory distress. No wheezes, rales or ronchi noted.  Abdomen: Soft and nontender. Normal bowel sounds. No distention or masses noted. Liver, spleen and kidneys non palpable due to size. Musculoskeletal: Normal range of motion. Strength 5/5 BUE/BLE. No difficulty with gait.  Neurological: Alert and oriented. Cranial nerves  II-XII grossly intact. Coordination normal.  Psychiatric: Mood and affect normal. Behavior is normal. Judgment and thought content normal.     BMET    Component Value Date/Time   NA 139 12/06/2015 0905   K 3.8 12/06/2015 0905   CL 102 12/06/2015 0905   CO2 27 12/06/2015 0905   GLUCOSE 114 (H) 12/06/2015 0905   BUN 19 12/06/2015 0905   CREATININE 0.69 12/06/2015 0905   CALCIUM 9.7 12/06/2015 0905    Lipid Panel     Component Value Date/Time   CHOL 224 (H) 12/06/2015 0905   TRIG 165.0 (H) 12/06/2015 0905   HDL 38.40 (L) 12/06/2015 0905   CHOLHDL 6 12/06/2015 0905   VLDL 33.0 12/06/2015 0905   LDLCALC 152 (H) 12/06/2015 0905    CBC    Component Value Date/Time   WBC 8.6 12/06/2015 0905   RBC 4.03  12/06/2015 0905   HGB 12.2 12/06/2015 0905   HCT 36.4 12/06/2015 0905   PLT 345.0 12/06/2015 0905   MCV 90.3 12/06/2015 0905   MCHC 33.5 12/06/2015 0905   RDW 13.5 12/06/2015 0905    Hgb A1C Lab Results  Component Value Date   HGBA1C 6.0 12/06/2015        Assessment & Plan:   Preventative Health Maintenance:  She declines flu or tetanus booster today Pap smear and mammogram UTD Will get bone density exam from previous PCP Colon screening UTD Encouraged her to consume a balanced diet and exercise regimen Advised her to see an eye doctor and dentist annually Will check CBC, CMET, Lipid, A1C, HIV and Hep C today  RTC in 1 year, sooner if needed Webb Silversmith, NP

## 2016-12-13 NOTE — Assessment & Plan Note (Signed)
Controlled on Lisinopril HCT CMET today 

## 2016-12-13 NOTE — Assessment & Plan Note (Addendum)
Lipid profile today Encouraged her to consume a low fat diet If LDL not at goal, will discuss statin therapy

## 2016-12-13 NOTE — Assessment & Plan Note (Signed)
Given recent rashes, advised her to start taking Allegra daily

## 2016-12-13 NOTE — Patient Instructions (Signed)

## 2016-12-13 NOTE — Assessment & Plan Note (Signed)
Slightly worse ? Allergy component Advised her to try Allegra daily to see if that helps Continue Albuterol prn

## 2016-12-13 NOTE — Assessment & Plan Note (Signed)
Continue Protonix daily CMET today

## 2016-12-14 LAB — CBC
HEMATOCRIT: 35.5 % — AB (ref 36.0–46.0)
Hemoglobin: 12.1 g/dL (ref 12.0–15.0)
MCHC: 34.2 g/dL (ref 30.0–36.0)
MCV: 89.6 fl (ref 78.0–100.0)
Platelets: 366 10*3/uL (ref 150.0–400.0)
RBC: 3.96 Mil/uL (ref 3.87–5.11)
RDW: 13.8 % (ref 11.5–15.5)
WBC: 9.2 10*3/uL (ref 4.0–10.5)

## 2016-12-14 LAB — HIV ANTIBODY (ROUTINE TESTING W REFLEX): HIV: NONREACTIVE

## 2016-12-14 LAB — HEPATITIS C ANTIBODY: HCV Ab: NEGATIVE

## 2016-12-25 ENCOUNTER — Other Ambulatory Visit: Payer: Self-pay | Admitting: *Deleted

## 2016-12-25 ENCOUNTER — Telehealth: Payer: Self-pay | Admitting: Internal Medicine

## 2016-12-25 MED ORDER — SIMVASTATIN 10 MG PO TABS: 10.0000 mg | ORAL_TABLET | Freq: Every day | ORAL | 3 refills | Status: DC

## 2016-12-25 NOTE — Telephone Encounter (Signed)
Spoke with patient about lab results.

## 2016-12-25 NOTE — Telephone Encounter (Signed)
Pt called regarding labs, said she never got message  Pt requesting cb

## 2016-12-26 NOTE — Addendum Note (Signed)
Addended by: Lurlean Nanny on: 12/26/2016 10:13 AM   Modules accepted: Orders

## 2017-02-08 ENCOUNTER — Ambulatory Visit (INDEPENDENT_AMBULATORY_CARE_PROVIDER_SITE_OTHER): Payer: Managed Care, Other (non HMO) | Admitting: Internal Medicine

## 2017-02-08 ENCOUNTER — Encounter: Payer: Self-pay | Admitting: Internal Medicine

## 2017-02-08 VITALS — BP 118/78 | HR 80 | Temp 97.8°F | Wt 260.8 lb

## 2017-02-08 DIAGNOSIS — B078 Other viral warts: Secondary | ICD-10-CM | POA: Diagnosis not present

## 2017-02-08 NOTE — Progress Notes (Signed)
Subjective:    Patient ID: Patricia Garner, female    DOB: 05/12/1956, 61 y.o.   MRN: [id number]  HPI  Pt presents to the clinic today with c/o a bump on the middle finger of her left hand. She noticed this 2 weeks ago. It does seem to be getting bigger and more painful. She tried to pop it with a pin, but reports no fluid came out. She has not tried anything additional OTC.  Review of Systems      Past Medical History:  Diagnosis Date  . Allergy   . Arthritis   . Asthma   . Barrett esophagus   . Chicken pox   . GERD (gastroesophageal reflux disease)   . History of colon polyps   . Hypertension     Current Outpatient Prescriptions  Medication Sig Dispense Refill  . Calcium-Vitamin D 600-200 MG-UNIT tablet Take by mouth.    . Cholecalciferol (VITAMIN D3) 1000 units CAPS Take 1 capsule by mouth daily.     . CVS ZINC 50 MG TABS Take by mouth.    . ferrous sulfate 325 (65 FE) MG EC tablet Take by mouth.    Marland Kitchen lisinopril-hydrochlorothiazide (PRINZIDE,ZESTORETIC) 20-25 MG tablet TAKE ONE TABLET BY MOUTH ONCE DAILY 90 tablet 0  . Multiple Vitamins-Minerals (MULTIVITAMIN WITH MINERALS) tablet Take by mouth.    . nystatin-triamcinolone ointment (MYCOLOG) Apply 1 application topically as needed.    . pantoprazole (PROTONIX) 40 MG tablet Take 1 tablet by mouth daily.    Marland Kitchen PROVENTIL HFA 108 (90 Base) MCG/ACT inhaler INHALE ONE TO TWO PUFFS BY MOUTH EVERY 6 HOURS AS NEEDED FOR WHEEZING OR SHORTNESS OF BREATH 3 Inhaler 0  . simvastatin (ZOCOR) 10 MG tablet Take 1 tablet (10 mg total) by mouth daily. 30 tablet 3   No current facility-administered medications for this visit.     Allergies  Allergen Reactions  . Codeine Nausea And Vomiting  . Hydrocodone Hives    Family History  Problem Relation Age of Onset  . Arthritis Mother   . Heart disease Mother   . Stroke Mother   . Hypertension Mother   . Alcohol abuse Father   . Alcohol abuse Sister   . Mental illness Sister   . Alcohol  abuse Brother   . Drug abuse Brother   . Arthritis Maternal Grandmother   . Stroke Maternal Grandmother   . Colon cancer Maternal Grandfather     Social History   Social History  . Marital status: Married    Spouse name: N/A  . Number of children: N/A  . Years of education: N/A   Occupational History  . Not on file.   Social History Main Topics  . Smoking status: Never Smoker  . Smokeless tobacco: Never Used  . Alcohol use No  . Drug use: No  . Sexual activity: Not Currently   Other Topics Concern  . Not on file   Social History Narrative  . No narrative on file     Constitutional: Denies fever, malaise, fatigue, headache or abrupt weight changes.  Skin: Pt reports bump on finger. Denies redness, rashes, lesions or ulcercations.    No other specific complaints in a complete review of systems (except as listed in HPI above).  Objective:   Physical Exam  BP 118/78   Pulse 80   Temp 97.8 F (36.6 C) (Oral)   Wt 260 lb 12 oz (118.3 kg)   SpO2 98%   BMI 46.19 kg/m  Wt Readings from Last 3 Encounters:  02/08/17 260 lb 12 oz (118.3 kg)  12/13/16 259 lb (117.5 kg)  08/28/16 255 lb (115.7 kg)    General: Appears her stated age, in NAD. Skin: <0.5 cm round raised wart on left medial middle finger.  BMET    Component Value Date/Time   NA 139 12/13/2016 1502   K 3.7 12/13/2016 1502   CL 100 12/13/2016 1502   CO2 32 12/13/2016 1502   GLUCOSE 99 12/13/2016 1502   BUN 21 12/13/2016 1502   CREATININE 0.68 12/13/2016 1502   CALCIUM 10.1 12/13/2016 1502    Lipid Panel     Component Value Date/Time   CHOL 237 (H) 12/13/2016 1502   TRIG 229.0 (H) 12/13/2016 1502   HDL 45.90 12/13/2016 1502   CHOLHDL 5 12/13/2016 1502   VLDL 45.8 (H) 12/13/2016 1502   LDLCALC 152 (H) 12/06/2015 0905    CBC    Component Value Date/Time   WBC 9.2 12/13/2016 1502   RBC 3.96 12/13/2016 1502   HGB 12.1 12/13/2016 1502   HCT 35.5 (L) 12/13/2016 1502   PLT 366.0  12/13/2016 1502   MCV 89.6 12/13/2016 1502   MCHC 34.2 12/13/2016 1502   RDW 13.8 12/13/2016 1502    Hgb A1C Lab Results  Component Value Date   HGBA1C 6.1 12/13/2016            Assessment & Plan:   Wart of Finger:  Advised her to try Compound W bandaid's,  If no improvement, we can try to freeze off with liquid nitrogen  RTC as needed or if symptoms persist or worsen Jasmin Winberry, NP

## 2017-02-08 NOTE — Patient Instructions (Signed)
Warts Introduction Warts are small growths on the skin. They are common, and they are caused by a type of germ (virus). Warts can occur on many areas of the body. A person may have one wart or more than one wart. Warts can spread if you scratch a wart and then scratch normal skin. Most warts will go away over many months to a couple years. Treatments may be done if needed. Follow these instructions at home:  Apply over-the-counter and prescription medicines only as told by your doctor.  Do not apply over-the-counter wart medicines to your face or genitals before you ask your doctor if it is okay to do that.  Do not scratch or pick at a wart.  Wash your hands after you touch a wart.  Avoid shaving hair that is over a wart.  Keep all follow-up visits as told by your doctor. This is important. Contact a doctor if:  Your warts do not improve after treatment.  You have redness, swelling, or pain at the site of a wart.  You have bleeding from a wart, and the bleeding does not stop when you put light pressure on the wart.  You have diabetes and you get a wart. This information is not intended to replace advice given to you by your health care provider. Make sure you discuss any questions you have with your health care provider. Document Released: 04/19/2011 Document Revised: 05/24/2016 Document Reviewed: 03/14/2015  2017 Elsevier

## 2017-03-04 ENCOUNTER — Ambulatory Visit (INDEPENDENT_AMBULATORY_CARE_PROVIDER_SITE_OTHER): Payer: Managed Care, Other (non HMO) | Admitting: Internal Medicine

## 2017-03-04 ENCOUNTER — Encounter: Payer: Self-pay | Admitting: Internal Medicine

## 2017-03-04 VITALS — BP 136/80 | HR 78 | Temp 98.1°F | Wt 265.8 lb

## 2017-03-04 DIAGNOSIS — B079 Viral wart, unspecified: Secondary | ICD-10-CM | POA: Diagnosis not present

## 2017-03-04 NOTE — Patient Instructions (Addendum)
Warts Warts are small growths on the skin. They are common, and they are caused by a type of germ (virus). Warts can occur on many areas of the body. A person may have one wart or more than one wart. Warts can spread if you scratch a wart and then scratch normal skin. Most warts will go away over many months to a couple years. Treatments may be done if needed. Follow these instructions at home:  Apply over-the-counter and prescription medicines only as told by your doctor.  Do not apply over-the-counter wart medicines to your face or genitals before you ask your doctor if it is okay to do that.  Do not scratch or pick at a wart.  Wash your hands after you touch a wart.  Avoid shaving hair that is over a wart.  Keep all follow-up visits as told by your doctor. This is important. Contact a doctor if:  Your warts do not improve after treatment.  You have redness, swelling, or pain at the site of a wart.  You have bleeding from a wart, and the bleeding does not stop when you put light pressure on the wart.  You have diabetes and you get a wart. This information is not intended to replace advice given to you by your health care provider. Make sure you discuss any questions you have with your health care provider. Document Released: 04/19/2011 Document Revised: 05/24/2016 Document Reviewed: 03/14/2015 Elsevier Interactive Patient Education  2017 [employer] Inc[employer]  

## 2017-03-04 NOTE — Progress Notes (Signed)
Subjective:    Patient ID: Patricia Garner, female    DOB: 1956/05/21, 61 y.o.   MRN: [id number]  HPI  Pt presents to the clinic today with c/o a wart on her left middle finger. She noticed this a few months ago. She has been using OTC wart treatment, but it seemed to make it worse. She would like to have it frozen off today.   Review of Systems      Past Medical History:  Diagnosis Date  . Allergy   . Arthritis   . Asthma   . Barrett esophagus   . Chicken pox   . GERD (gastroesophageal reflux disease)   . History of colon polyps   . Hypertension     Current Outpatient Prescriptions  Medication Sig Dispense Refill  . Calcium-Vitamin D 600-200 MG-UNIT tablet Take by mouth.    . Cholecalciferol (VITAMIN D3) 1000 units CAPS Take 1 capsule by mouth daily.     . CVS ZINC 50 MG TABS Take by mouth.    . ferrous sulfate 325 (65 FE) MG EC tablet Take by mouth.    Marland Kitchen lisinopril-hydrochlorothiazide (PRINZIDE,ZESTORETIC) 20-25 MG tablet TAKE ONE TABLET BY MOUTH ONCE DAILY 90 tablet 0  . Multiple Vitamins-Minerals (MULTIVITAMIN WITH MINERALS) tablet Take by mouth.    . nystatin-triamcinolone ointment (MYCOLOG) Apply 1 application topically as needed.    . pantoprazole (PROTONIX) 40 MG tablet Take 1 tablet by mouth daily.    Marland Kitchen PROVENTIL HFA 108 (90 Base) MCG/ACT inhaler INHALE ONE TO TWO PUFFS BY MOUTH EVERY 6 HOURS AS NEEDED FOR WHEEZING OR SHORTNESS OF BREATH 3 Inhaler 0  . simvastatin (ZOCOR) 10 MG tablet Take 1 tablet (10 mg total) by mouth daily. 30 tablet 3   No current facility-administered medications for this visit.     Allergies  Allergen Reactions  . Codeine Nausea And Vomiting  . Hydrocodone Hives    Family History  Problem Relation Age of Onset  . Arthritis Mother   . Heart disease Mother   . Stroke Mother   . Hypertension Mother   . Alcohol abuse Father   . Alcohol abuse Sister   . Mental illness Sister   . Alcohol abuse Brother   . Drug abuse Brother   .  Arthritis Maternal Grandmother   . Stroke Maternal Grandmother   . Colon cancer Maternal Grandfather     Social History   Social History  . Marital status: Married    Spouse name: N/A  . Number of children: N/A  . Years of education: N/A   Occupational History  . Not on file.   Social History Main Topics  . Smoking status: Never Smoker  . Smokeless tobacco: Never Used  . Alcohol use No  . Drug use: No  . Sexual activity: Not Currently   Other Topics Concern  . Not on file   Social History Narrative  . No narrative on file     Constitutional: Denies fever, malaise, fatigue, headache or abrupt weight changes. .  Skin: Pt reports wart of finger. Denies redness, rashes, or ulcercations.    No other specific complaints in a complete review of systems (except as listed in HPI above).  Objective:   Physical Exam   BP 136/80   Pulse 78   Temp 98.1 F (36.7 C) (Oral)   Wt 265 lb 12 oz (120.5 kg)   SpO2 98%   BMI 47.08 kg/m  Wt Readings from Last 3 Encounters:  03/04/17  265 lb 12 oz (120.5 kg)  02/08/17 260 lb 12 oz (118.3 kg)  12/13/16 259 lb (117.5 kg)    General: Appears her stated age, in NAD. Skin: .05 round raised lesion over DIP left middle finger.  BMET    Component Value Date/Time   NA 139 12/13/2016 1502   K 3.7 12/13/2016 1502   CL 100 12/13/2016 1502   CO2 32 12/13/2016 1502   GLUCOSE 99 12/13/2016 1502   BUN 21 12/13/2016 1502   CREATININE 0.68 12/13/2016 1502   CALCIUM 10.1 12/13/2016 1502    Lipid Panel     Component Value Date/Time   CHOL 237 (H) 12/13/2016 1502   TRIG 229.0 (H) 12/13/2016 1502   HDL 45.90 12/13/2016 1502   CHOLHDL 5 12/13/2016 1502   VLDL 45.8 (H) 12/13/2016 1502   LDLCALC 152 (H) 12/06/2015 0905    CBC    Component Value Date/Time   WBC 9.2 12/13/2016 1502   RBC 3.96 12/13/2016 1502   HGB 12.1 12/13/2016 1502   HCT 35.5 (L) 12/13/2016 1502   PLT 366.0 12/13/2016 1502   MCV 89.6 12/13/2016 1502   MCHC  34.2 12/13/2016 1502   RDW 13.8 12/13/2016 1502    Hgb A1C Lab Results  Component Value Date   HGBA1C 6.1 12/13/2016           Assessment & Plan:   Wart:  Frozen with liquid nitrogen, 10 secs x 2 rounds Bandaid applied Aftercare instructions given  RTC in 2 weeks if wart has not resoved Webb Silversmith, NP

## 2017-03-11 ENCOUNTER — Ambulatory Visit: Payer: Self-pay | Admitting: Obstetrics and Gynecology

## 2017-03-25 ENCOUNTER — Other Ambulatory Visit: Payer: Self-pay | Admitting: Internal Medicine

## 2017-03-27 ENCOUNTER — Other Ambulatory Visit: Payer: Managed Care, Other (non HMO)

## 2017-04-11 ENCOUNTER — Ambulatory Visit (INDEPENDENT_AMBULATORY_CARE_PROVIDER_SITE_OTHER): Payer: Managed Care, Other (non HMO) | Admitting: Obstetrics and Gynecology

## 2017-04-11 ENCOUNTER — Encounter: Payer: Self-pay | Admitting: Obstetrics and Gynecology

## 2017-04-11 VITALS — BP 122/82 | HR 84 | Ht 63.0 in | Wt 264.0 lb

## 2017-04-11 DIAGNOSIS — N761 Subacute and chronic vaginitis: Secondary | ICD-10-CM

## 2017-04-11 DIAGNOSIS — Z1239 Encounter for other screening for malignant neoplasm of breast: Secondary | ICD-10-CM

## 2017-04-11 DIAGNOSIS — Z1231 Encounter for screening mammogram for malignant neoplasm of breast: Secondary | ICD-10-CM

## 2017-04-11 DIAGNOSIS — Z01419 Encounter for gynecological examination (general) (routine) without abnormal findings: Secondary | ICD-10-CM

## 2017-04-11 MED ORDER — CLOTRIMAZOLE-BETAMETHASONE 1-0.05 % EX CREA
1.0000 | TOPICAL_CREAM | Freq: Two times a day (BID) | CUTANEOUS | 0 refills | Status: DC
Start: 2017-04-11 — End: 2019-04-23

## 2017-04-11 NOTE — Progress Notes (Signed)
Chief Complaint  Patient presents with  . Gynecologic Exam    HPI:      Ms. Patricia Garner is a 61 y.o. [id number] who LMP was No LMP recorded. Patient is postmenopausal., presents today for her annual examination.  Her menses are absent due to menopause. Dysmenorrhea none. She does not have intermenstrual bleeding.  She does not have vasomotor sx.   Sex activity: single partner, contraception - post menopausal status. She does have vaginal dryness. She uses lubricants with relied.   Last Pap: November 16, 2015  Results were: no abnormalities /neg HPV DNA.   She complains of chronic vaginal irritation. Dr. Ammie Dalton gave her mycolog crm that helped initially, but sx have persisted. She sweats a lot vaginally. She uses dove soap and dryer sheets. She denies any increased d/c, or odor.   Last mammogram: November 09, 2015  Results were: normal--routine follow-up in 12 months There is no FH of breast cancer. There is no FH of ovarian cancer. The patient does not do self-breast exams.  Colonoscopy: colonoscopy 2 years ago   Tobacco use: The patient denies current or previous tobacco use. Alcohol use: none Exercise: not active  She does get adequate calcium and Vitamin D in her diet.   Past Medical History:  Diagnosis Date  . Allergy   . Arthritis   . Asthma   . Barrett esophagus   . Chicken pox   . GERD (gastroesophageal reflux disease)   . History of colon polyps   . Hypertension   . Osteoporosis     Past Surgical History:  Procedure Laterality Date  . CHOLECYSTECTOMY  1983  . COLONOSCOPY WITH PROPOFOL N/A 08/28/2016   Procedure: COLONOSCOPY WITH PROPOFOL;  Surgeon: Lollie Sails, MD;  Location: [facility] ENDOSCOPY;  Service: Endoscopy;  Laterality: N/A;  . ESOPHAGOGASTRODUODENOSCOPY (EGD) WITH PROPOFOL N/A 08/28/2016   Procedure: ESOPHAGOGASTRODUODENOSCOPY (EGD) WITH PROPOFOL;  Surgeon: Lollie Sails, MD;  Location: [facility] ENDOSCOPY;  Service: Endoscopy;  Laterality: N/A;    . TONSILLECTOMY  1969    Family History  Problem Relation Age of Onset  . Arthritis Mother   . Heart disease Mother   . Stroke Mother   . Hypertension Mother   . Alcohol abuse Father   . Alcohol abuse Sister   . Mental illness Sister   . Stomach cancer Sister 70  . Alcohol abuse Brother   . Drug abuse Brother   . Arthritis Maternal Grandmother   . Stroke Maternal Grandmother   . Colon cancer Maternal Grandfather 28    Social History   Social History  . Marital status: Married    Spouse name: N/A  . Number of children: N/A  . Years of education: N/A   Occupational History  . Not on file.   Social History Main Topics  . Smoking status: Never Smoker  . Smokeless tobacco: Never Used  . Alcohol use No  . Drug use: No  . Sexual activity: Not Currently   Other Topics Concern  . Not on file   Social History Narrative  . No narrative on file     Current Outpatient Prescriptions:  .  Calcium-Vitamin D 600-200 MG-UNIT tablet, Take by mouth., Disp: , Rfl:  .  ferrous sulfate 325 (65 FE) MG EC tablet, Take by mouth., Disp: , Rfl:  .  lisinopril-hydrochlorothiazide (PRINZIDE,ZESTORETIC) 20-25 MG tablet, TAKE ONE TABLET BY MOUTH ONCE DAILY, Disp: 90 tablet, Rfl: 1 .  Multiple Vitamins-Minerals (MULTIVITAMIN WITH MINERALS) tablet, Take  by mouth., Disp: , Rfl:  .  pantoprazole (PROTONIX) 40 MG tablet, Take 1 tablet by mouth daily., Disp: , Rfl:  .  PROVENTIL HFA 108 (90 Base) MCG/ACT inhaler, INHALE ONE TO TWO PUFFS BY MOUTH EVERY 6 HOURS AS NEEDED FOR WHEEZING OR SHORTNESS OF BREATH, Disp: 3 Inhaler, Rfl: 0 .  Cholecalciferol (VITAMIN D3) 1000 units CAPS, Take 1 capsule by mouth daily. , Disp: , Rfl:  .  clotrimazole-betamethasone (LOTRISONE) cream, Apply 1 application topically 2 (two) times daily. Apply externally BID for 2 wks, Disp: 45 g, Rfl: 0 .  CVS ZINC 50 MG TABS, Take by mouth., Disp: , Rfl:  .  nystatin-triamcinolone ointment (MYCOLOG), Apply 1 application  topically as needed., Disp: , Rfl:    ROS:  Review of Systems  Constitutional: Negative for fever, malaise/fatigue and weight loss.  HENT: Negative for congestion, ear pain and sinus pain.   Respiratory: Negative for cough, shortness of breath and wheezing.   Cardiovascular: Negative for chest pain, orthopnea and leg swelling.  Gastrointestinal: Negative for constipation, diarrhea, nausea and vomiting.  Genitourinary: Negative for dysuria, frequency, hematuria and urgency.       Breast ROS: negative   Musculoskeletal: Positive for joint pain. Negative for back pain and myalgias.  Skin: Negative for itching and rash.  Neurological: Negative for dizziness, tingling, focal weakness and headaches.  Endo/Heme/Allergies: Negative for environmental allergies. Does not bruise/bleed easily.  Psychiatric/Behavioral: Negative for depression and suicidal ideas. The patient is not nervous/anxious and does not have insomnia.     Objective: BP 122/82   Pulse 84   Ht 5\' 3"  (1.6 m)   Wt 264 lb (119.7 kg)   BMI 46.77 kg/m    Physical Exam  Constitutional: She is oriented to person, place, and time. She appears well-developed and well-nourished.  Genitourinary: Vagina normal and uterus normal.  There is rash on the right labia.  There is rash on the left labia. No erythema or tenderness in the vagina. No vaginal discharge found. Right adnexum does not display mass and does not display tenderness. Left adnexum does not display mass and does not display tenderness. Cervix does not exhibit motion tenderness or polyp. Uterus is not enlarged or tender.  Genitourinary Comments: Bilat labia minora with white, hypertrophied area/excoriations.   Neck: Normal range of motion. No thyromegaly present.  Cardiovascular: Normal rate, regular rhythm and normal heart sounds.   No murmur heard. Pulmonary/Chest: Effort normal and breath sounds normal. Right breast exhibits no mass, no nipple discharge, no skin  change and no tenderness. Left breast exhibits no mass, no nipple discharge, no skin change and no tenderness.  Abdominal: Soft. There is no tenderness. There is no guarding.  Musculoskeletal: Normal range of motion.  Neurological: She is alert and oriented to person, place, and time. No cranial nerve deficit.  Psychiatric: She has a normal mood and affect. Her behavior is normal.  Vitals reviewed.    Assessment/Plan:  Encounter for annual routine gynecological examination  Screening for breast cancer - Pt to sched at [facility].  - Plan: MM DIGITAL SCREENING BILATERAL  Chronic vaginitis - Hypertrophy on exam. Try Rx clotrimazole/betamethasone crm for 2 wks, dove sens skin soap/line dry underwear. RTO for bx to rule out LS if sx persist after tx. - Plan: clotrimazole-betamethasone (LOTRISONE) cream            GYN counsel mammography screening, adequate intake of calcium and vitamin D, diet and exercise     F/U  Return in  about 1 year (around 04/11/2018), or if symptoms worsen or fail to improve.  Shirlette Scarber B. Delrico Minehart, PA-C 04/11/2017 10:12 AM

## 2017-05-16 ENCOUNTER — Other Ambulatory Visit: Payer: Managed Care, Other (non HMO)

## 2017-05-23 ENCOUNTER — Other Ambulatory Visit: Payer: Managed Care, Other (non HMO)

## 2017-07-11 ENCOUNTER — Other Ambulatory Visit (INDEPENDENT_AMBULATORY_CARE_PROVIDER_SITE_OTHER): Payer: Managed Care, Other (non HMO)

## 2017-07-11 DIAGNOSIS — E7800 Pure hypercholesterolemia, unspecified: Secondary | ICD-10-CM

## 2017-07-11 LAB — COMPREHENSIVE METABOLIC PANEL
ALBUMIN: 4.2 g/dL (ref 3.5–5.2)
ALT: 26 U/L (ref 0–35)
AST: 18 U/L (ref 0–37)
Alkaline Phosphatase: 61 U/L (ref 39–117)
BUN: 18 mg/dL (ref 6–23)
CALCIUM: 9.7 mg/dL (ref 8.4–10.5)
CHLORIDE: 102 meq/L (ref 96–112)
CO2: 31 meq/L (ref 19–32)
CREATININE: 0.75 mg/dL (ref 0.40–1.20)
GFR: 83.87 mL/min (ref >60.00)
Glucose, Bld: 124 mg/dL — ABNORMAL HIGH (ref 70–99)
POTASSIUM: 3.9 meq/L (ref 3.5–5.1)
Sodium: 140 mEq/L (ref 135–145)
Total Bilirubin: 0.5 mg/dL (ref 0.2–1.2)
Total Protein: 6.9 g/dL (ref 6.0–8.3)

## 2017-07-11 LAB — LIPID PANEL
CHOL/HDL RATIO: 6
CHOLESTEROL: 247 mg/dL — AB (ref 0–200)
HDL: 40.1 mg/dL (ref >39.00)
LDL CALC: 168 mg/dL — AB (ref 0–99)
NonHDL: 206.77
Triglycerides: 195 mg/dL — ABNORMAL HIGH (ref 0.0–149.0)
VLDL: 39 mg/dL (ref 0.0–40.0)

## 2017-07-19 MED ORDER — ATORVASTATIN CALCIUM 40 MG PO TABS: 40.0000 mg | ORAL_TABLET | Freq: Every day | ORAL | 0 refills | Status: DC

## 2017-07-19 NOTE — Addendum Note (Signed)
Addended by: Lurlean Nanny on: 07/19/2017 04:44 PM   Modules accepted: Orders

## 2017-09-27 ENCOUNTER — Ambulatory Visit (INDEPENDENT_AMBULATORY_CARE_PROVIDER_SITE_OTHER): Payer: Managed Care, Other (non HMO) | Admitting: Internal Medicine

## 2017-09-27 ENCOUNTER — Encounter: Payer: Self-pay | Admitting: Internal Medicine

## 2017-09-27 VITALS — BP 126/76 | HR 96 | Temp 98.4°F | Wt 267.0 lb

## 2017-09-27 DIAGNOSIS — H66002 Acute suppurative otitis media without spontaneous rupture of ear drum, left ear: Secondary | ICD-10-CM

## 2017-09-27 DIAGNOSIS — H6690 Otitis media, unspecified, unspecified ear: Secondary | ICD-10-CM | POA: Insufficient documentation

## 2017-09-27 MED ORDER — AMOXICILLIN 500 MG PO TABS: 1000.0000 mg | ORAL_TABLET | Freq: Two times a day (BID) | ORAL | 0 refills | Status: AC

## 2017-09-27 NOTE — Assessment & Plan Note (Signed)
Also with sinusitis symptoms Discussed that this can still be viral--but fair likelihood of secondary bacterial infection Discussed symptomatic rx --analgesics,etc Rx for amoxil to start if worsens

## 2017-09-27 NOTE — Progress Notes (Signed)
Subjective:    Patient ID: Patricia Garner, female    DOB: Dec 30, 1956, 61 y.o.   MRN: [id number]  HPI Here due to respiratory illness Caught something from 52 year old granddaughter (lives with her)  Started with head cold all this week Now with ear pain last night Increased nasal secretions---thick, heavy drainage No clear fever (hasn't taken it)---but has had some sweats Not much headache--just frontal pressure Has sore throat--- some pain with swallowing Cough with same Scheaffer sputum (from drainage) Not really SOB--did have some tightness yesterday (inhaler helped)  Just using dayquil-- some help till last night  Current Outpatient Prescriptions on File Prior to Visit  Medication Sig Dispense Refill  . atorvastatin (LIPITOR) 40 MG tablet Take 1 tablet (40 mg total) by mouth daily. 90 tablet 0  . Calcium-Vitamin D 600-200 MG-UNIT tablet Take by mouth.    . Cholecalciferol (VITAMIN D3) 1000 units CAPS Take 1 capsule by mouth daily.     . clotrimazole-betamethasone (LOTRISONE) cream Apply 1 application topically 2 (two) times daily. Apply externally BID for 2 wks 45 g 0  . CVS ZINC 50 MG TABS Take by mouth.    . ferrous sulfate 325 (65 FE) MG EC tablet Take by mouth.    Marland Kitchen lisinopril-hydrochlorothiazide (PRINZIDE,ZESTORETIC) 20-25 MG tablet TAKE ONE TABLET BY MOUTH ONCE DAILY 90 tablet 1  . Multiple Vitamins-Minerals (MULTIVITAMIN WITH MINERALS) tablet Take by mouth.    Marland Kitchen PROVENTIL HFA 108 (90 Base) MCG/ACT inhaler INHALE ONE TO TWO PUFFS BY MOUTH EVERY 6 HOURS AS NEEDED FOR WHEEZING OR SHORTNESS OF BREATH 3 Inhaler 0  . pantoprazole (PROTONIX) 40 MG tablet Take 1 tablet by mouth daily.     No current facility-administered medications on file prior to visit.     Allergies  Allergen Reactions  . Codeine Nausea And Vomiting  . Hydrocodone Hives    Past Medical History:  Diagnosis Date  . Allergy   . Arthritis   . Asthma   . Barrett esophagus   . Chicken pox   . GERD  (gastroesophageal reflux disease)   . History of colon polyps   . Hypertension   . Osteoporosis     Past Surgical History:  Procedure Laterality Date  . CHOLECYSTECTOMY  1983  . COLONOSCOPY WITH PROPOFOL N/A 08/28/2016   Procedure: COLONOSCOPY WITH PROPOFOL;  Surgeon: Lollie Sails, MD;  Location: [facility] ENDOSCOPY;  Service: Endoscopy;  Laterality: N/A;  . ESOPHAGOGASTRODUODENOSCOPY (EGD) WITH PROPOFOL N/A 08/28/2016   Procedure: ESOPHAGOGASTRODUODENOSCOPY (EGD) WITH PROPOFOL;  Surgeon: Lollie Sails, MD;  Location: [facility] ENDOSCOPY;  Service: Endoscopy;  Laterality: N/A;  . TONSILLECTOMY  1969    Family History  Problem Relation Age of Onset  . Arthritis Mother   . Heart disease Mother   . Stroke Mother   . Hypertension Mother   . Alcohol abuse Father   . Alcohol abuse Sister   . Mental illness Sister   . Stomach cancer Sister 74  . Alcohol abuse Brother   . Drug abuse Brother   . Arthritis Maternal Grandmother   . Stroke Maternal Grandmother   . Colon cancer Maternal Grandfather 28    Social History   Social History  . Marital status: Married    Spouse name: N/A  . Number of children: N/A  . Years of education: N/A   Occupational History  . Not on file.   Social History Main Topics  . Smoking status: Never Smoker  . Smokeless tobacco: Never Used  .  Alcohol use No  . Drug use: No  . Sexual activity: Not Currently   Other Topics Concern  . Not on file   Social History Narrative  . No narrative on file   Review of Systems Notes side effects from lipitor--severe muscle pain No vomiting or diarrhea Appetite is okay No rash    Objective:   Physical Exam  HENT:  No sinus tenderness Right TM normal Left TM is inflamed and slightly bulging Moderate nasal inflammation No sig pharyngeal injection  Neck: Normal range of motion.  Pulmonary/Chest: Effort normal and breath sounds normal. No respiratory distress. She has no wheezes. She has no rales.    Lymphadenopathy:    She has no cervical adenopathy.          Assessment & Plan:

## 2017-09-30 ENCOUNTER — Other Ambulatory Visit: Payer: Self-pay | Admitting: Internal Medicine

## 2017-10-16 ENCOUNTER — Telehealth: Payer: Self-pay | Admitting: Internal Medicine

## 2017-10-16 NOTE — Telephone Encounter (Signed)
Pt has appt to see Avie Echevaria NP on 10/17/17 at 8:30. I spoke with pt and she will keep that appt and if condition worsens prior to appt pt will go to ED.

## 2017-10-16 NOTE — Telephone Encounter (Signed)
[place]  Patient Name: Patricia Garner  DOB: 02/19/1956    Initial Comment Caller was out of town and prescribed some Medrol and Bactrim for bronchitis and is experiencing nausea and abdomen pain.    Nurse Assessment  Nurse: Harlow Mares, RN, Suanne Marker Date/Time Eilene Ghazi Time): 10/16/2017 12:31:40 PM  Confirm and document reason for call. If symptomatic, describe symptoms. ---Caller was out of town and prescribed some Medrol and Bactrim for bronchitis and is experiencing nausea and abdomen pain. Reports that she didn't take after Sunday (had 1 day left). Reports that she continues to have nausea and abd pain. Tums not helping.  Does the patient have any new or worsening symptoms? ---Yes  Will a triage be completed? ---Yes  Related visit to physician within the last 2 weeks? ---Yes  Does the PT have any chronic conditions? (i.e. diabetes, asthma, etc.) ---Yes  List chronic conditions. ---HTN; Barrett's esophagus;  Is this a behavioral health or substance abuse call? ---No     Guidelines    Guideline Title Affirmed Question Affirmed Notes  Abdominal Pain - Upper [1] MILD-MODERATE pain AND [2] constant AND [3] present > 2 hours    Final Disposition User   See Physician within 4 Hours (or PCP triage) Harlow Mares, RN, Suanne Marker    Comments  Appt made with Webb Silversmith for tomorrow am at 8:30am [facility]. Caller reports that she cannot make it to the office today because of prior committments with her family.   Referrals  GO TO FACILITY REFUSED   Caller Disagree/Comply Disagree  Caller Understands Yes  PreDisposition Call Doctor

## 2017-10-17 ENCOUNTER — Ambulatory Visit (INDEPENDENT_AMBULATORY_CARE_PROVIDER_SITE_OTHER): Payer: Managed Care, Other (non HMO) | Admitting: Internal Medicine

## 2017-10-17 ENCOUNTER — Encounter: Payer: Self-pay | Admitting: Internal Medicine

## 2017-10-17 VITALS — BP 124/78 | HR 65 | Temp 98.0°F | Wt 266.0 lb

## 2017-10-17 DIAGNOSIS — T466X5A Adverse effect of antihyperlipidemic and antiarteriosclerotic drugs, initial encounter: Secondary | ICD-10-CM | POA: Diagnosis not present

## 2017-10-17 DIAGNOSIS — R195 Other fecal abnormalities: Secondary | ICD-10-CM | POA: Diagnosis not present

## 2017-10-17 DIAGNOSIS — R1013 Epigastric pain: Secondary | ICD-10-CM

## 2017-10-17 DIAGNOSIS — R110 Nausea: Secondary | ICD-10-CM

## 2017-10-17 DIAGNOSIS — M791 Myalgia, unspecified site: Secondary | ICD-10-CM | POA: Diagnosis not present

## 2017-10-17 NOTE — Progress Notes (Signed)
Subjective:    Patient ID: Patricia Garner, female    DOB: 11/24/1956, 61 y.o.   MRN: [id number]  HPI  Pt presents to the clinic today with c/o nausea and abdominal pain. This started 4 days ago. She reports tightness in her upper abdomen. She has had associated gas and loose stools but denies vomiting or blood in her stool. She reports she was prescribed a Medrol Dose Pak and Bactrim for acute bronchitis on 10/9. She never finished her course of Bactrim because she was concerned the antibiotic may have been causing this. She has tried Tums and Pepto Bismol with some relief. She denies any other recent medication changes. She did recently travel to [place], but reports she did not consume anything out of the ordinary and is positive she did not come in contact with contaminated water. She has not had sick contacts that she is aware of.   She reports she stopped taking the Lipitor about 3 weeks ago secondary to myalgias. Once she stopped taking the Lipitor, the muscle aches resolved.  Review of Systems      Past Medical History:  Diagnosis Date  . Allergy   . Arthritis   . Asthma   . Barrett esophagus   . Chicken pox   . GERD (gastroesophageal reflux disease)   . History of colon polyps   . Hypertension   . Osteoporosis     Current Outpatient Prescriptions  Medication Sig Dispense Refill  . atorvastatin (LIPITOR) 40 MG tablet Take 1 tablet (40 mg total) by mouth daily. 90 tablet 0  . Calcium-Vitamin D 600-200 MG-UNIT tablet Take by mouth.    . Cholecalciferol (VITAMIN D3) 1000 units CAPS Take 1 capsule by mouth daily.     . clotrimazole-betamethasone (LOTRISONE) cream Apply 1 application topically 2 (two) times daily. Apply externally BID for 2 wks 45 g 0  . CVS ZINC 50 MG TABS Take by mouth.    . ferrous sulfate 325 (65 FE) MG EC tablet Take by mouth.    Marland Kitchen lisinopril-hydrochlorothiazide (PRINZIDE,ZESTORETIC) 20-25 MG tablet TAKE 1 TABLET BY MOUTH ONCE DAILY 90 tablet 0  .  Multiple Vitamins-Minerals (MULTIVITAMIN WITH MINERALS) tablet Take by mouth.    . pantoprazole (PROTONIX) 40 MG tablet Take 1 tablet by mouth daily.    Marland Kitchen PROVENTIL HFA 108 (90 Base) MCG/ACT inhaler INHALE ONE TO TWO PUFFS BY MOUTH EVERY 6 HOURS AS NEEDED FOR WHEEZING OR SHORTNESS OF BREATH 3 Inhaler 0   No current facility-administered medications for this visit.     Allergies  Allergen Reactions  . Codeine Nausea And Vomiting  . Hydrocodone Hives    Family History  Problem Relation Age of Onset  . Arthritis Mother   . Heart disease Mother   . Stroke Mother   . Hypertension Mother   . Alcohol abuse Father   . Alcohol abuse Sister   . Mental illness Sister   . Stomach cancer Sister 39  . Alcohol abuse Brother   . Drug abuse Brother   . Arthritis Maternal Grandmother   . Stroke Maternal Grandmother   . Colon cancer Maternal Grandfather 73    Social History   Social History  . Marital status: Married    Spouse name: N/A  . Number of children: N/A  . Years of education: N/A   Occupational History  . Not on file.   Social History Main Topics  . Smoking status: Never Smoker  . Smokeless tobacco: Never Used  .  Alcohol use No  . Drug use: No  . Sexual activity: Not Currently   Other Topics Concern  . Not on file   Social History Narrative  . No narrative on file     Constitutional: Denies fever, malaise, fatigue, headache or abrupt weight changes.  Gastrointestinal: Pt reports nausea, abdominal pain and loose stool. Denies bloating, constipation, or blood in the stool.  GU: Denies urgency, frequency, pain with urination, burning sensation, blood in urine, odor or discharge. Musculoskeletal: Denies decrease in range of motion, difficulty with gait, muscle pain or joint pain and swelling.   No other specific complaints in a complete review of systems (except as listed in HPI above).  Objective:   Physical Exam  BP 124/78   Pulse 65   Temp 98 F (36.7 C)  (Oral)   Wt 266 lb (120.7 kg)   SpO2 98%   BMI 47.12 kg/m  Wt Readings from Last 3 Encounters:  10/17/17 266 lb (120.7 kg)  09/27/17 267 lb (121.1 kg)  04/11/17 264 lb (119.7 kg)    General: Appears her stated age, obese in NAD. Abdomen: Soft and mildly tender in the epigastric region. Normal bowel sounds. No distention or masses noted.  Musculoskeletal: No difficulty with gait.  Neurological: Alert and oriented.   BMET    Component Value Date/Time   NA 140 07/11/2017 0848   K 3.9 07/11/2017 0848   CL 102 07/11/2017 0848   CO2 31 07/11/2017 0848   GLUCOSE 124 (H) 07/11/2017 0848   BUN 18 07/11/2017 0848   CREATININE 0.75 07/11/2017 0848   CALCIUM 9.7 07/11/2017 0848    Lipid Panel     Component Value Date/Time   CHOL 247 (H) 07/11/2017 0848   TRIG 195.0 (H) 07/11/2017 0848   HDL 40.10 07/11/2017 0848   CHOLHDL 6 07/11/2017 0848   VLDL 39.0 07/11/2017 0848   LDLCALC 168 (H) 07/11/2017 0848    CBC    Component Value Date/Time   WBC 9.2 12/13/2016 1502   RBC 3.96 12/13/2016 1502   HGB 12.1 12/13/2016 1502   HCT 35.5 (L) 12/13/2016 1502   PLT 366.0 12/13/2016 1502   MCV 89.6 12/13/2016 1502   MCHC 34.2 12/13/2016 1502   RDW 13.8 12/13/2016 1502    Hgb A1C Lab Results  Component Value Date   HGBA1C 6.1 12/13/2016            Assessment & Plan:   Nausea, Epigastric Pain, Loose Stools:  Symptoms are improving with OTC Tums and Pepto Bismol Offered RX for Zofran but she declines Encouraged her to push fluids, rest and consume a bland diet No further intervention needed at this time  Statin Induced Myalgias:  She will hold Lipitor for now Will recheck lipid profile at annual exam in December 2018 Consider another statin if LDL > 130 Encouraged her to consume a low fat diet, and increase aerobic exercise  Return precautions discussed Webb Silversmith, NP

## 2017-10-17 NOTE — Patient Instructions (Signed)
Nausea, Adult Feeling sick to your stomach (nausea) means that your stomach is upset or you feel like you have to throw up (vomit). Feeling sick to your stomach is usually not serious, but it may be an early sign of a more serious medical problem. As you feel sicker to your stomach, it can lead to throwing up (vomiting). If you throw up, or if you are not able to drink enough fluids, there is a risk of dehydration. Dehydration can make you feel tired and thirsty, have a dry mouth, and pee (urinate) less often. Older adults and people who have other diseases or a weak defense (immune) system have a higher risk of dehydration. The main goal of treating this condition is to:  Limit how often you feel sick to your stomach.  Prevent throwing up and dehydration.  Follow these instructions at home: Follow instructions from your doctor about how to care for yourself at home. Eating and drinking Follow these recommendations as told by your doctor:  Take an oral rehydration solution (ORS). This is a drink that is sold at pharmacies and stores.  Drink clear fluids in small amounts as you are able, such as: ? Water. ? Ice chips. ? Fruit juice that has water added (diluted fruit juice). ? Low-calorie sports drinks.  Eat bland, easy to digest foods in small amounts as you are able, such as: ? Bananas. ? Applesauce. ? Rice. ? Lean meats. ? Toast. ? Crackers.  Avoid drinking fluids that contain a lot of sugar or caffeine.  Avoid alcohol.  Avoid spicy or fatty foods.  General instructions  Drink enough fluid to keep your pee (urine) clear or pale yellow.  Wash your hands often. If you cannot use soap and water, use hand sanitizer.  Make sure that all people in your household wash their hands well and often.  Rest at home while you get better.  Take over-the-counter and prescription medicines only as told by your doctor.  Breathe slowly and deeply when you feel sick to your  stomach.  Watch your condition for any changes.  Keep all follow-up visits as told by your doctor. This is important. Contact a doctor if:  You have a headache.  You have new symptoms.  You feel sicker to your stomach.  You have a fever.  You feel light-headed or dizzy.  You throw up.  You are not able to keep fluids down. Get help right away if:  You have pain in your chest, neck, arm, or jaw.  You feel very weak or you pass out (faint).  You have throw up that is bright red or looks like coffee grounds.  You have bloody or black poop (stools), or poop that looks like tar.  You have a very bad headache, a stiff neck, or both.  You have very bad pain, cramping, or bloating in your belly.  You have a rash.  You have trouble breathing or you are breathing very quickly.  Your heart is beating very quickly.  Your skin feels cold and clammy.  You feel confused.  You have pain while peeing.  You have signs of dehydration, such as: ? Dark pee, or very little or no pee. ? Cracked lips. ? Dry mouth. ? Sunken eyes. ? Sleepiness. ? Weakness. These symptoms may be an emergency. Do not wait to see if the symptoms will go away. Get medical help right away. Call your local emergency services (911 in the [place] Do not drive yourself to   the hospital. This information is not intended to replace advice given to you by your health care provider. Make sure you discuss any questions you have with your health care provider. Document Released: 12/06/2011 Document Revised: 05/24/2016 Document Reviewed: 08/23/2015 Elsevier Interactive Patient Education  2018 [employer]  

## 2017-10-26 ENCOUNTER — Ambulatory Visit
Admission: EM | Admit: 2017-10-26 | Discharge: 2017-10-26 | Disposition: A | Discharge status: Home / Self Care | Payer: Managed Care, Other (non HMO) | Attending: Family Medicine | Admitting: Family Medicine

## 2017-10-26 DIAGNOSIS — R05 Cough: Secondary | ICD-10-CM

## 2017-10-26 DIAGNOSIS — J9801 Acute bronchospasm: Secondary | ICD-10-CM

## 2017-10-26 DIAGNOSIS — J40 Bronchitis, not specified as acute or chronic: Secondary | ICD-10-CM

## 2017-10-26 DIAGNOSIS — R059 Cough, unspecified: Secondary | ICD-10-CM

## 2017-10-26 MED ORDER — IPRATROPIUM-ALBUTEROL 0.5-2.5 (3) MG/3ML IN SOLN
3.0000 mL | Freq: Once | RESPIRATORY_TRACT | Status: AC
Start: 2017-10-26 — End: 2017-10-26
  Administered 2017-10-26: 3 mL via RESPIRATORY_TRACT

## 2017-10-26 MED ORDER — AZITHROMYCIN 250 MG PO TABS: ORAL_TABLET | ORAL | 0 refills | Status: DC

## 2017-10-26 MED ORDER — BECLOMETHASONE DIPROPIONATE 40 MCG/ACT IN AERS: 2.0000 | INHALATION_SPRAY | Freq: Two times a day (BID) | RESPIRATORY_TRACT | 0 refills | Status: DC

## 2017-10-26 NOTE — ED Provider Notes (Signed)
[facility]    CSN: [id number] Arrival date & time: 10/26/17  1401     History   Chief Complaint Chief Complaint  Patient presents with  . Cough    HPI Patricia Garner is a 61 y.o. female.    Cough  Associated symptoms: wheezing   URI  Presenting symptoms: congestion, cough and fatigue   Severity:  Moderate Onset quality:  Sudden Duration:  2 weeks Timing:  Constant Progression:  Partially resolved (states was in [place] about 10 days ago and prescribed Bactrim and Medrol dose pack but stopped after 5 days due to intolerance) Chronicity:  New Relieved by:  Nothing Ineffective treatments:  OTC medications Associated symptoms: wheezing   Risk factors: chronic respiratory disease and sick contacts     Past Medical History:  Diagnosis Date  . Allergy   . Arthritis   . Asthma   . Barrett esophagus   . Chicken pox   . GERD (gastroesophageal reflux disease)   . History of colon polyps   . Hypertension   . Osteoporosis     Patient Active Problem List   Diagnosis Date Noted  . Otitis media 09/27/2017  . Primary osteoarthritis of left knee 07/17/2016  . Hyperlipidemia, unspecified 12/13/2015  . Essential (primary) hypertension 06/13/2015  . Arthritis 06/13/2015  . Uncomplicated asthma 78/29/5621  . Seasonal allergies 06/13/2015  . Barrett's esophagus without dysplasia 06/13/2015  . Osteoarthritis 06/13/2015    Past Surgical History:  Procedure Laterality Date  . CHOLECYSTECTOMY  1983  . COLONOSCOPY WITH PROPOFOL N/A 08/28/2016   Procedure: COLONOSCOPY WITH PROPOFOL;  Surgeon: Lollie Sails, MD;  Location: [facility] ENDOSCOPY;  Service: Endoscopy;  Laterality: N/A;  . ESOPHAGOGASTRODUODENOSCOPY (EGD) WITH PROPOFOL N/A 08/28/2016   Procedure: ESOPHAGOGASTRODUODENOSCOPY (EGD) WITH PROPOFOL;  Surgeon: Lollie Sails, MD;  Location: [facility] ENDOSCOPY;  Service: Endoscopy;  Laterality: N/A;  . TONSILLECTOMY  1969    OB History    Gravida Para Term  Preterm AB Living   2 2 2     2    SAB TAB Ectopic Multiple Live Births                   Home Medications    Prior to Admission medications   Medication Sig Start Date End Date Taking? Authorizing Provider  azithromycin (ZITHROMAX Z-PAK) 250 MG tablet 2 tabs po once day 1, then 1 tab po qd for next 4 days 10/26/17   Norval Gable, MD  beclomethasone (QVAR) 40 MCG/ACT inhaler Inhale 2 puffs into the lungs 2 (two) times daily. 10/26/17   Norval Gable, MD  Calcium-Vitamin D 600-200 MG-UNIT tablet Take by mouth.    [provider]  Cholecalciferol (VITAMIN D3) 1000 units CAPS Take 1 capsule by mouth daily.     [provider]  clotrimazole-betamethasone (LOTRISONE) cream Apply 1 application topically 2 (two) times daily. Apply externally BID for 2 wks 03/07/64   Copland, Alicia B, PA-C  [facility] ZINC 50 MG TABS Take by mouth.    [provider]  ferrous sulfate 325 (65 FE) MG EC tablet Take by mouth.    [provider]  lisinopril-hydrochlorothiazide (PRINZIDE,ZESTORETIC) 20-25 MG tablet TAKE 1 TABLET BY MOUTH ONCE DAILY 10/01/17   [name], NP  Multiple Vitamins-Minerals (MULTIVITAMIN WITH MINERALS) tablet Take by mouth.    [provider]  pantoprazole (PROTONIX) 40 MG tablet Take 1 tablet by mouth daily. 08/28/16 08/28/17  [provider]  [place] 108 (90 Base)  MCG/ACT inhaler INHALE ONE TO TWO PUFFS BY MOUTH EVERY 6 HOURS AS NEEDED FOR WHEEZING OR SHORTNESS OF BREATH 10/15/16   [name], NP    Family History Family History  Problem Relation Age of Onset  . Arthritis Mother   . Heart disease Mother   . Stroke Mother   . Hypertension Mother   . Alcohol abuse Father   . Alcohol abuse Sister   . Mental illness Sister   . Stomach cancer Sister 41  . Alcohol abuse Brother   . Drug abuse Brother   . Arthritis Maternal Grandmother   . Stroke Maternal Grandmother   . Colon cancer Maternal Grandfather 74    Social  History Social History  Substance Use Topics  . Smoking status: Never Smoker  . Smokeless tobacco: Never Used  . Alcohol use No     Allergies   Bactrim [sulfamethoxazole-trimethoprim]; Codeine; Hydrocodone; Lipitor [atorvastatin]; and Medrol [methylprednisolone]   Review of Systems Review of Systems  Constitutional: Positive for fatigue.  HENT: Positive for congestion.   Respiratory: Positive for cough and wheezing.      Physical Exam Triage Vital Signs ED Triage Vitals  Enc Vitals Group     BP 10/26/17 1427 133/68     Pulse Rate 10/26/17 1427 (!) 109     Resp 10/26/17 1427 20     Temp 10/26/17 1427 99.6 F (37.6 C)     Temp Source 10/26/17 1427 Oral     SpO2 10/26/17 1427 95 %     Weight 10/26/17 1429 255 lb (115.7 kg)     Height 10/26/17 1429 5\' 3"  (1.6 m)     Head Circumference --      Peak Flow --      Pain Score --      Pain Loc --      Pain Edu? --      Excl. in [place]? --    No data found.   Updated Vital Signs BP 133/68 (BP Location: Left Arm)   Pulse (!) 109   Temp 99.6 F (37.6 C) (Oral)   Resp 20   Ht 5\' 3"  (1.6 m)   Wt 255 lb (115.7 kg)   SpO2 95%   BMI 45.17 kg/m   Visual Acuity Right Eye Distance:   Left Eye Distance:   Bilateral Distance:    Right Eye Near:   Left Eye Near:    Bilateral Near:     Physical Exam  Constitutional: She appears well-developed and well-nourished. No distress.  HENT:  Head: Normocephalic and atraumatic.  Right Ear: Tympanic membrane, external ear and ear canal normal.  Left Ear: Tympanic membrane, external ear and ear canal normal.  Nose: No mucosal edema, rhinorrhea, nose lacerations, sinus tenderness, nasal deformity, septal deviation or nasal septal hematoma. No epistaxis.  No foreign bodies. Right sinus exhibits no maxillary sinus tenderness and no frontal sinus tenderness. Left sinus exhibits no maxillary sinus tenderness and no frontal sinus tenderness.  Mouth/Throat: Uvula is midline, oropharynx is  clear and moist and mucous membranes are normal. No oropharyngeal exudate.  Eyes: Pupils are equal, round, and reactive to light. Conjunctivae and EOM are normal. Right eye exhibits no discharge. Left eye exhibits no discharge. No scleral icterus.  Neck: Normal range of motion. Neck supple. No thyromegaly present.  Cardiovascular: Normal rate, regular rhythm and normal heart sounds.   Pulmonary/Chest: Effort normal. No respiratory distress. She has wheezes. She has no rales.  Lymphadenopathy:    She has  no cervical adenopathy.  Skin: She is not diaphoretic.  Nursing note and vitals reviewed.    UC Treatments / Results  Labs (all labs ordered are listed, but only abnormal results are displayed) Labs Reviewed - No data to display  EKG  EKG Interpretation None       Radiology No results found.  Procedures Procedures (including critical care time)  Medications Ordered in UC Medications  ipratropium-albuterol (DUONEB) 0.5-2.5 (3) MG/3ML nebulizer solution 3 mL (3 mLs Nebulization Given 10/26/17 1502)     Initial Impression / Assessment and Plan / UC Course  I have reviewed the triage vital signs and the nursing notes.  Pertinent labs & imaging results that were available during my care of the patient were reviewed by me and considered in my medical decision making (see chart for details).       Final Clinical Impressions(s) / UC Diagnoses   Final diagnoses:  Bronchospasm  Cough  Bronchitis    New Prescriptions Discharge Medication List as of 10/26/2017  3:29 PM    START taking these medications   Details  azithromycin (ZITHROMAX Z-PAK) 250 MG tablet 2 tabs po once day 1, then 1 tab po qd for next 4 days, Normal    beclomethasone (QVAR) 40 MCG/ACT inhaler Inhale 2 puffs into the lungs 2 (two) times daily., Starting Sat 10/26/2017, Normal      1. diagnosis reviewed with patient 2. Patient given duoneb tx x 1 with improvement of symptoms 3.  rx as per orders  above; reviewed possible side effects, interactions, risks and benefits  3. Recommend supportive treatment with rest, fluids 4. Follow-up prn if symptoms worsen or don't improve  Controlled Substance Prescriptions [place] Controlled Substance Registry consulted? Not Applicable   Norval Gable, MD 10/26/17 [phone]

## 2017-10-26 NOTE — ED Triage Notes (Signed)
Pt with cough starting yesterday productive of yellow sputum. Also feels SOB. Expiratory wheezes in triage and states her inhaler was helping yesterday but not today. Recently treated for Bronchitis

## 2017-12-05 LAB — HM MAMMOGRAPHY

## 2017-12-06 ENCOUNTER — Encounter: Payer: Self-pay | Admitting: Internal Medicine

## 2017-12-23 ENCOUNTER — Other Ambulatory Visit: Payer: Self-pay | Admitting: Internal Medicine

## 2018-03-18 ENCOUNTER — Ambulatory Visit: Payer: Managed Care, Other (non HMO) | Admitting: Family Medicine

## 2018-04-06 ENCOUNTER — Other Ambulatory Visit: Payer: Self-pay | Admitting: Internal Medicine

## 2018-04-07 ENCOUNTER — Encounter: Payer: Self-pay | Admitting: Internal Medicine

## 2018-04-07 ENCOUNTER — Ambulatory Visit: Payer: Self-pay | Admitting: Internal Medicine

## 2018-04-07 VITALS — BP 126/80 | HR 81 | Temp 97.9°F | Ht 63.0 in | Wt 261.0 lb

## 2018-04-07 DIAGNOSIS — I10 Essential (primary) hypertension: Secondary | ICD-10-CM

## 2018-04-07 DIAGNOSIS — J4530 Mild persistent asthma, uncomplicated: Secondary | ICD-10-CM

## 2018-04-07 DIAGNOSIS — M816 Localized osteoporosis [Lequesne]: Secondary | ICD-10-CM

## 2018-04-07 DIAGNOSIS — M170 Bilateral primary osteoarthritis of knee: Secondary | ICD-10-CM

## 2018-04-07 DIAGNOSIS — Z0000 Encounter for general adult medical examination without abnormal findings: Secondary | ICD-10-CM

## 2018-04-07 DIAGNOSIS — K2270 Barrett's esophagus without dysplasia: Secondary | ICD-10-CM

## 2018-04-07 NOTE — Patient Instructions (Signed)
Health Maintenance for Postmenopausal Women Menopause is a normal process in which your reproductive ability comes to an end. This process happens gradually over a span of months to years, usually between the ages of 22 and 9. Menopause is complete when you have missed 12 consecutive menstrual periods. It is important to talk with your health care provider about some of the most common conditions that affect postmenopausal women, such as heart disease, cancer, and bone loss (osteoporosis). Adopting a healthy lifestyle and getting preventive care can help to promote your health and wellness. Those actions can also lower your chances of developing some of these common conditions. What should I know about menopause? During menopause, you may experience a number of symptoms, such as:  Moderate-to-severe hot flashes.  Night sweats.  Decrease in sex drive.  Mood swings.  Headaches.  Tiredness.  Irritability.  Memory problems.  Insomnia.  Choosing to treat or not to treat menopausal changes is an individual decision that you make with your health care provider. What should I know about hormone replacement therapy and supplements? Hormone therapy products are effective for treating symptoms that are associated with menopause, such as hot flashes and night sweats. Hormone replacement carries certain risks, especially as you become older. If you are thinking about using estrogen or estrogen with progestin treatments, discuss the benefits and risks with your health care provider. What should I know about heart disease and stroke? Heart disease, heart attack, and stroke become more likely as you age. This may be due, in part, to the hormonal changes that your body experiences during menopause. These can affect how your body processes dietary fats, triglycerides, and cholesterol. Heart attack and stroke are both medical emergencies. There are many things that you can do to help prevent heart disease  and stroke:  Have your blood pressure checked at least every 1-2 years. High blood pressure causes heart disease and increases the risk of stroke.  If you are 53-22 years old, ask your health care provider if you should take aspirin to prevent a heart attack or a stroke.  Do not use any tobacco products, including cigarettes, chewing tobacco, or electronic cigarettes. If you need help quitting, ask your health care provider.  It is important to eat a healthy diet and maintain a healthy weight. ? Be sure to include plenty of vegetables, fruits, low-fat dairy products, and lean protein. ? Avoid eating foods that are high in solid fats, added sugars, or salt (sodium).  Get regular exercise. This is one of the most important things that you can do for your health. ? Try to exercise for at least 150 minutes each week. The type of exercise that you do should increase your heart rate and make you sweat. This is known as moderate-intensity exercise. ? Try to do strengthening exercises at least twice each week. Do these in addition to the moderate-intensity exercise.  Know your numbers.Ask your health care provider to check your cholesterol and your blood glucose. Continue to have your blood tested as directed by your health care provider.  What should I know about cancer screening? There are several types of cancer. Take the following steps to reduce your risk and to catch any cancer development as early as possible. Breast Cancer  Practice breast self-awareness. ? This means understanding how your breasts normally appear and feel. ? It also means doing regular breast self-exams. Let your health care provider know about any changes, no matter how small.  If you are 40  or older, have a clinician do a breast exam (clinical breast exam or CBE) every year. Depending on your age, family history, and medical history, it may be recommended that you also have a yearly breast X-ray (mammogram).  If you  have a family history of breast cancer, talk with your health care provider about genetic screening.  If you are at high risk for breast cancer, talk with your health care provider about having an MRI and a mammogram every year.  Breast cancer (BRCA) gene test is recommended for women who have family members with BRCA-related cancers. Results of the assessment will determine the need for genetic counseling and BRCA1 and for BRCA2 testing. BRCA-related cancers include these types: ? Breast. This occurs in males or females. ? Ovarian. ? Tubal. This may also be called fallopian tube cancer. ? Cancer of the abdominal or pelvic lining (peritoneal cancer). ? Prostate. ? Pancreatic.  Cervical, Uterine, and Ovarian Cancer Your health care provider may recommend that you be screened regularly for cancer of the pelvic organs. These include your ovaries, uterus, and vagina. This screening involves a pelvic exam, which includes checking for microscopic changes to the surface of your cervix (Pap test).  For women ages 21-65, health care providers may recommend a pelvic exam and a Pap test every three years. For women ages 79-65, they may recommend the Pap test and pelvic exam, combined with testing for human papilloma virus (HPV), every five years. Some types of HPV increase your risk of cervical cancer. Testing for HPV may also be done on women of any age who have unclear Pap test results.  Other health care providers may not recommend any screening for nonpregnant women who are considered low risk for pelvic cancer and have no symptoms. Ask your health care provider if a screening pelvic exam is right for you.  If you have had past treatment for cervical cancer or a condition that could lead to cancer, you need Pap tests and screening for cancer for at least 20 years after your treatment. If Pap tests have been discontinued for you, your risk factors (such as having a new sexual partner) need to be  reassessed to determine if you should start having screenings again. Some women have medical problems that increase the chance of getting cervical cancer. In these cases, your health care provider may recommend that you have screening and Pap tests more often.  If you have a family history of uterine cancer or ovarian cancer, talk with your health care provider about genetic screening.  If you have vaginal bleeding after reaching menopause, tell your health care provider.  There are currently no reliable tests available to screen for ovarian cancer.  Lung Cancer Lung cancer screening is recommended for adults 69-62 years old who are at high risk for lung cancer because of a history of smoking. A yearly low-dose CT scan of the lungs is recommended if you:  Currently smoke.  Have a history of at least 30 pack-years of smoking and you currently smoke or have quit within the past 15 years. A pack-year is smoking an average of one pack of cigarettes per day for one year.  Yearly screening should:  Continue until it has been 15 years since you quit.  Stop if you develop a health problem that would prevent you from having lung cancer treatment.  Colorectal Cancer  This type of cancer can be detected and can often be prevented.  Routine colorectal cancer screening usually begins at  age 42 and continues through age 45.  If you have risk factors for colon cancer, your health care provider may recommend that you be screened at an earlier age.  If you have a family history of colorectal cancer, talk with your health care provider about genetic screening.  Your health care provider may also recommend using home test kits to check for hidden blood in your stool.  A small camera at the end of a tube can be used to examine your colon directly (sigmoidoscopy or colonoscopy). This is done to check for the earliest forms of colorectal cancer.  Direct examination of the colon should be repeated every  5-10 years until age 71. However, if early forms of precancerous polyps or small growths are found or if you have a family history or genetic risk for colorectal cancer, you may need to be screened more often.  Skin Cancer  Check your skin from head to toe regularly.  Monitor any moles. Be sure to tell your health care provider: ? About any new moles or changes in moles, especially if there is a change in a mole's shape or color. ? If you have a mole that is larger than the size of a pencil eraser.  If any of your family members has a history of skin cancer, especially at a young age, talk with your health care provider about genetic screening.  Always use sunscreen. Apply sunscreen liberally and repeatedly throughout the day.  Whenever you are outside, protect yourself by wearing long sleeves, pants, a wide-brimmed hat, and sunglasses.  What should I know about osteoporosis? Osteoporosis is a condition in which bone destruction happens more quickly than new bone creation. After menopause, you may be at an increased risk for osteoporosis. To help prevent osteoporosis or the bone fractures that can happen because of osteoporosis, the following is recommended:  If you are 46-71 years old, get at least 1,000 mg of calcium and at least 600 mg of vitamin D per day.  If you are older than age 55 but younger than age 65, get at least 1,200 mg of calcium and at least 600 mg of vitamin D per day.  If you are older than age 54, get at least 1,200 mg of calcium and at least 800 mg of vitamin D per day.  Smoking and excessive alcohol intake increase the risk of osteoporosis. Eat foods that are rich in calcium and vitamin D, and do weight-bearing exercises several times each week as directed by your health care provider. What should I know about how menopause affects my mental health? Depression may occur at any age, but it is more common as you become older. Common symptoms of depression  include:  Low or sad mood.  Changes in sleep patterns.  Changes in appetite or eating patterns.  Feeling an overall lack of motivation or enjoyment of activities that you previously enjoyed.  Frequent crying spells.  Talk with your health care provider if you think that you are experiencing depression. What should I know about immunizations? It is important that you get and maintain your immunizations. These include:  Tetanus, diphtheria, and pertussis (Tdap) booster vaccine.  Influenza every year before the flu season begins.  Pneumonia vaccine.  Shingles vaccine.  Your health care provider may also recommend other immunizations. This information is not intended to replace advice given to you by your health care provider. Make sure you discuss any questions you have with your health care provider. Document Released: 02/08/2006  Document Revised: 07/06/2016 Document Reviewed: 09/20/2015 [employer] Interactive Patient Education  2018 [employer]  

## 2018-04-07 NOTE — Progress Notes (Signed)
Subjective:    Patient ID: Patricia Garner, female    DOB: [birth year], 62 y.o.   MRN: [id number]  HPI  Pt presents to the clinic today for her annual exam. She is also due to follow up chronic conditions.  Arthritis: Mainly in her knees. Exacerbated by her weight.   Asthma: Mild persistent. Controlled on QVAR and Albuterol.  GERD with Barrett's Esophagus: Controlled on Pantoprazole.   HTN: Her BP today is 126/80. She is taking Lisinopril HCT as prescribed. There is no ECG on file.  Osteoporosis: Bone density exam from 2009 reviewed. She is taking Calcium and Vit D daily.  Flu: never Tetanus: 10 years ago Zostovax: never Shingrix: never Pap Smear: 11/2015 Mammogram: 11/2017 Bone Density: 2009 Colon Screening: 07/2016 Vision Screening: annually Dentist: Annually  Diet: She does eat meat. She consumes fruits and veggies daily. She tries to avoid fried foods. She drinks mostly water, coffee, tea.   Review of Systems      Past Medical History:  Diagnosis Date  . Allergy   . Arthritis   . Asthma   . Barrett esophagus   . Chicken pox   . GERD (gastroesophageal reflux disease)   . History of colon polyps   . Hypertension   . Osteoporosis     Current Outpatient Medications  Medication Sig Dispense Refill  . beclomethasone (QVAR) 40 MCG/ACT inhaler Inhale 2 puffs into the lungs 2 (two) times daily. 1 Inhaler 0  . Calcium-Vitamin D 600-200 MG-UNIT tablet Take by mouth.    . clotrimazole-betamethasone (LOTRISONE) cream Apply 1 application topically 2 (two) times daily. Apply externally BID for 2 wks 45 g 0  . [facility] ZINC 50 MG TABS Take by mouth.    . ferrous sulfate 325 (65 FE) MG EC tablet Take by mouth.    Marland Kitchen lisinopril-hydrochlorothiazide (PRINZIDE,ZESTORETIC) 20-25 MG tablet Take 1 tablet by mouth daily. 90 tablet 0  . Multiple Vitamins-Minerals (MULTIVITAMIN WITH MINERALS) tablet Take by mouth.    Marland Kitchen PROVENTIL HFA 108 (90 Base) MCG/ACT inhaler INHALE ONE TO TWO PUFFS BY  MOUTH EVERY 6 HOURS AS NEEDED FOR WHEEZING OR SHORTNESS OF BREATH 3 Inhaler 0  . pantoprazole (PROTONIX) 40 MG tablet Take 1 tablet by mouth daily.     No current facility-administered medications for this visit.     Allergies  Allergen Reactions  . Bactrim [Sulfamethoxazole-Trimethoprim] Nausea Only  . Codeine Nausea And Vomiting  . Hydrocodone Hives  . Lipitor [Atorvastatin] Other (See Comments)    Myalgia   . Medrol [Methylprednisolone] Nausea Only    Family History  Problem Relation Age of Onset  . Arthritis Mother   . Heart disease Mother   . Stroke Mother   . Hypertension Mother   . Alcohol abuse Father   . Alcohol abuse Sister   . Mental illness Sister   . Stomach cancer Sister 40  . Alcohol abuse Brother   . Drug abuse Brother   . Arthritis Maternal Grandmother   . Stroke Maternal Grandmother   . Colon cancer Maternal Grandfather 57    Social History   Socioeconomic History  . Marital status: Married    Spouse name: Not on file  . Number of children: Not on file  . Years of education: Not on file  . Highest education level: Not on file  Occupational History  . Not on file  Social Needs  . Financial resource strain: Not on file  . Food insecurity:    Worry: Not on  file    Inability: Not on file  . Transportation needs:    Medical: Not on file    Non-medical: Not on file  Tobacco Use  . Smoking status: Never Smoker  . Smokeless tobacco: Never Used  Substance and Sexual Activity  . Alcohol use: No    Alcohol/week: 0.0 oz  . Drug use: No  . Sexual activity: Not Currently  Lifestyle  . Physical activity:    Days per week: Not on file    Minutes per session: Not on file  . Stress: Not on file  Relationships  . Social connections:    Talks on phone: Not on file    Gets together: Not on file    Attends religious service: Not on file    Active member of club or organization: Not on file    Attends meetings of clubs or organizations: Not on file     Relationship status: Not on file  . Intimate partner violence:    Fear of current or ex partner: Not on file    Emotionally abused: Not on file    Physically abused: Not on file    Forced sexual activity: Not on file  Other Topics Concern  . Not on file  Social History Narrative  . Not on file     Constitutional: Denies fever, malaise, fatigue, headache or abrupt weight changes.  HEENT: Denies eye pain, eye redness, ear pain, ringing in the ears, wax buildup, runny nose, nasal congestion, bloody nose, or sore throat. Respiratory: Denies difficulty breathing, shortness of breath, cough or sputum production.   Cardiovascular: Denies chest pain, chest tightness, palpitations or swelling in the hands or feet.  Gastrointestinal: Denies abdominal pain, bloating, constipation, diarrhea or blood in the stool.  GU: Denies urgency, frequency, pain with urination, burning sensation, blood in urine, odor or discharge. Musculoskeletal: Denies decrease in range of motion, difficulty with gait, muscle pain or joint pain and swelling.  Skin: Denies redness, rashes, lesions or ulcercations.  Neurological: Denies dizziness, difficulty with memory, difficulty with speech or problems with balance and coordination.  Psych: Denies anxiety, depression, SI/HI.  No other specific complaints in a complete review of systems (except as listed in HPI above).  Objective:   Physical Exam   BP 126/80   Pulse 81   Temp 97.9 F (36.6 C) (Oral)   Ht 5\' 3"  (1.6 m)   Wt 261 lb (118.4 kg)   SpO2 97%   BMI 46.23 kg/m  Wt Readings from Last 3 Encounters:  04/07/18 261 lb (118.4 kg)  10/26/17 255 lb (115.7 kg)  10/17/17 266 lb (120.7 kg)    General: Appears her stated age, obese in NAD. Skin: Warm, dry and intact.  HEENT: Head: normal shape and size; Eyes: sclera white, no icterus, conjunctiva pink, PERRLA and EOMs intact; Ears: Tm's gray and intact, normal light reflex;  Throat/Mouth: Teeth present, mucosa  pink and moist, no exudate, lesions or ulcerations noted.  Neck:  Neck supple, trachea midline. No masses, lumps or thyromegaly present.  Cardiovascular: Normal rate and rhythm. S1,S2 noted.  No murmur, rubs or gallops noted. No JVD or BLE edema. No carotid bruits noted. Pulmonary/Chest: Normal effort and positive vesicular breath sounds. No respiratory distress. No wheezes, rales or ronchi noted.  Abdomen: Soft and nontender. Normal bowel sounds. No distention or masses noted. Liver, spleen and kidneys non palpable. Musculoskeletal: Strength 5/5 BUE/BLE. No difficulty with gait.  Neurological: Alert and oriented. Cranial nerves II-XII grossly intact. Coordination  normal.  Psychiatric: Mood and affect normal. Behavior is normal. Judgment and thought content normal.    BMET    Component Value Date/Time   NA 139 04/07/2018 1621   K 4.0 04/07/2018 1621   CL 100 04/07/2018 1621   CO2 31 04/07/2018 1621   GLUCOSE 92 04/07/2018 1621   BUN 16 04/07/2018 1621   CREATININE 0.70 04/07/2018 1621   CALCIUM 10.2 04/07/2018 1621    Lipid Panel     Component Value Date/Time   CHOL 247 (H) 07/11/2017 0848   TRIG 195.0 (H) 07/11/2017 0848   HDL 40.10 07/11/2017 0848   CHOLHDL 6 07/11/2017 0848   VLDL 39.0 07/11/2017 0848   LDLCALC 168 (H) 07/11/2017 0848    CBC    Component Value Date/Time   WBC 9.2 12/13/2016 1502   RBC 3.96 12/13/2016 1502   HGB 12.1 12/13/2016 1502   HCT 35.5 (L) 12/13/2016 1502   PLT 366.0 12/13/2016 1502   MCV 89.6 12/13/2016 1502   MCHC 34.2 12/13/2016 1502   RDW 13.8 12/13/2016 1502    Hgb A1C Lab Results  Component Value Date   HGBA1C 6.1 12/13/2016           Assessment & Plan:   Preventative Health Maintenance:  Encouraged her to get a flu shot in the fall She declines tetanus, zostovax or shingrix Pap smear and mammogram UTD Colon Screening UTD She declines bone density exam Encouraged her to consume a balanced diet and exercise  regimen Advised her to see an eye doctor and dentist annually Will check BMET today. Will hold off on other labs for financial reasons  RTC in 1 year, sooner if needed Webb Silversmith, NP

## 2018-04-08 LAB — BASIC METABOLIC PANEL
BUN: 16 mg/dL (ref 6–23)
CALCIUM: 10.2 mg/dL (ref 8.4–10.5)
CO2: 31 mEq/L (ref 19–32)
CREATININE: 0.7 mg/dL (ref 0.40–1.20)
Chloride: 100 mEq/L (ref 96–112)
GFR: 90.59 mL/min (ref >60.00)
Glucose, Bld: 92 mg/dL (ref 70–99)
Potassium: 4 mEq/L (ref 3.5–5.1)
Sodium: 139 mEq/L (ref 135–145)

## 2018-04-12 ENCOUNTER — Encounter: Payer: Self-pay | Admitting: Internal Medicine

## 2018-04-12 DIAGNOSIS — M810 Age-related osteoporosis without current pathological fracture: Secondary | ICD-10-CM | POA: Insufficient documentation

## 2018-04-12 NOTE — Assessment & Plan Note (Signed)
Controlled on Lisinopril HCT BMET today Encouraged DASH diet and exercise for weight loss

## 2018-04-12 NOTE — Assessment & Plan Note (Signed)
Continue Pantoprazole She will follow up with GI as needed

## 2018-04-12 NOTE — Assessment & Plan Note (Signed)
Discussed how weight loss can help improve joint pain.  Advised her to take Ibuprofen as needed- avoid overuse given history of Barrett's Esophagus

## 2018-04-12 NOTE — Assessment & Plan Note (Addendum)
Continue QVAR and Albuterol Monitor

## 2018-04-12 NOTE — Assessment & Plan Note (Signed)
Encouraged at least 30 minutes weigh bearing activity daily Continue Calcium and Vit D

## 2018-06-29 ENCOUNTER — Other Ambulatory Visit: Payer: Self-pay | Admitting: Internal Medicine

## 2019-04-20 ENCOUNTER — Other Ambulatory Visit: Payer: Self-pay | Admitting: Internal Medicine

## 2019-04-21 NOTE — Telephone Encounter (Signed)
Pt is officially due for CPE, Rollene Fare wants to schedule f/u visit video [email or web address] to touch base in the meantime... [email or web address] for pt to schedule video f/u visit

## 2019-04-23 ENCOUNTER — Telehealth: Payer: Self-pay

## 2019-04-23 ENCOUNTER — Ambulatory Visit (INDEPENDENT_AMBULATORY_CARE_PROVIDER_SITE_OTHER): Payer: 59 | Admitting: Internal Medicine

## 2019-04-23 ENCOUNTER — Encounter: Payer: Self-pay | Admitting: Internal Medicine

## 2019-04-23 VITALS — BP 131/73 | HR 84 | Wt 262.0 lb

## 2019-04-23 DIAGNOSIS — I10 Essential (primary) hypertension: Secondary | ICD-10-CM | POA: Diagnosis not present

## 2019-04-23 DIAGNOSIS — M170 Bilateral primary osteoarthritis of knee: Secondary | ICD-10-CM | POA: Diagnosis not present

## 2019-04-23 DIAGNOSIS — J4520 Mild intermittent asthma, uncomplicated: Secondary | ICD-10-CM

## 2019-04-23 DIAGNOSIS — M816 Localized osteoporosis [Lequesne]: Secondary | ICD-10-CM

## 2019-04-23 DIAGNOSIS — K2270 Barrett's esophagus without dysplasia: Secondary | ICD-10-CM

## 2019-04-23 DIAGNOSIS — J4530 Mild persistent asthma, uncomplicated: Secondary | ICD-10-CM

## 2019-04-23 MED ORDER — PANTOPRAZOLE SODIUM 40 MG PO TBEC: 40.0000 mg | DELAYED_RELEASE_TABLET | Freq: Every day | ORAL | 2 refills | Status: DC

## 2019-04-23 MED ORDER — ALBUTEROL SULFATE HFA 108 (90 BASE) MCG/ACT IN AERS: INHALATION_SPRAY | RESPIRATORY_TRACT | 5 refills | Status: DC

## 2019-04-23 MED ORDER — LISINOPRIL-HYDROCHLOROTHIAZIDE 20-25 MG PO TABS: 1.0000 | ORAL_TABLET | Freq: Every day | ORAL | 2 refills | Status: DC

## 2019-04-23 NOTE — Progress Notes (Signed)
Virtual Visit via Video Note  I connected with Patricia Garner on 04/23/19 at  8:00 AM EDT by a video enabled telemedicine application and verified that I am speaking with the correct person using two identifiers.   I discussed the limitations of evaluation and management by telemedicine and the availability of in person appointments. The patient expressed understanding and agreed to proceed.  Patient Location: Home Provider Location: Office  History of Present Illness:  Pt due fur follow up of chronic conditions.  Arthritis: Mainly in her knees. Exacerbated by her weight. She takes Tylenol Arthritis or Ibuprofen as needed with good relief.  Asthma: Mild, persistent. Controlled with QVAR and Albuterol. There are no PFT's of file.  GERD: With Barrett's Esophagus. She denies difficulty swallowing at this time. She denies breakthrough on Omeprazole. Upper GI from 07/2016 reviewed.  HTN: Her BP at home today is 131/73. She is taking Lisinopril HCT as prescribed. She occasionally has There is no ECG on file.   Osteoporosis: Bone density exam from 2009 reviewed. She is taking Calcium and Vit D daily. She tries to get weight bearing exercise daily.    Past Medical History:  Diagnosis Date  . Allergy   . Arthritis   . Asthma   . Barrett esophagus   . Chicken pox   . GERD (gastroesophageal reflux disease)   . History of colon polyps   . Hypertension   . Osteoporosis     Current Outpatient Medications  Medication Sig Dispense Refill  . beclomethasone (QVAR) 40 MCG/ACT inhaler Inhale 2 puffs into the lungs 2 (two) times daily. 1 Inhaler 0  . Calcium-Vitamin D 600-200 MG-UNIT tablet Take by mouth.    . clotrimazole-betamethasone (LOTRISONE) cream Apply 1 application topically 2 (two) times daily. Apply externally BID for 2 wks 45 g 0  . [facility] ZINC 50 MG TABS Take by mouth.    . ferrous sulfate 325 (65 FE) MG EC tablet Take by mouth.    Marland Kitchen lisinopril-hydrochlorothiazide (ZESTORETIC) 20-25 MG  tablet Take 1 tablet by mouth once daily 90 tablet 0  . Multiple Vitamins-Minerals (MULTIVITAMIN WITH MINERALS) tablet Take by mouth.    . pantoprazole (PROTONIX) 40 MG tablet Take 1 tablet by mouth daily.    Marland Kitchen PROVENTIL HFA 108 (90 Base) MCG/ACT inhaler INHALE ONE TO TWO PUFFS BY MOUTH EVERY 6 HOURS AS NEEDED FOR WHEEZING OR SHORTNESS OF BREATH 3 Inhaler 0   No current facility-administered medications for this visit.     Allergies  Allergen Reactions  . Bactrim [Sulfamethoxazole-Trimethoprim] Nausea Only  . Codeine Nausea And Vomiting  . Hydrocodone Hives  . Lipitor [Atorvastatin] Other (See Comments)    Myalgia   . Medrol [Methylprednisolone] Nausea Only    Family History  Problem Relation Age of Onset  . Arthritis Mother   . Heart disease Mother   . Stroke Mother   . Hypertension Mother   . Alcohol abuse Father   . Alcohol abuse Sister   . Mental illness Sister   . Stomach cancer Sister 75  . Alcohol abuse Brother   . Drug abuse Brother   . Arthritis Maternal Grandmother   . Stroke Maternal Grandmother   . Colon cancer Maternal Grandfather 71    Social History   Socioeconomic History  . Marital status: Married    Spouse name: Not on file  . Number of children: Not on file  . Years of education: Not on file  . Highest education level: Not on file  Occupational History  . Not on file  Social Needs  . Financial resource strain: Not on file  . Food insecurity:    Worry: Not on file    Inability: Not on file  . Transportation needs:    Medical: Not on file    Non-medical: Not on file  Tobacco Use  . Smoking status: Never Smoker  . Smokeless tobacco: Never Used  Substance and Sexual Activity  . Alcohol use: No    Alcohol/week: 0.0 standard drinks  . Drug use: No  . Sexual activity: Not Currently  Lifestyle  . Physical activity:    Days per week: Not on file    Minutes per session: Not on file  . Stress: Not on file  Relationships  . Social  connections:    Talks on phone: Not on file    Gets together: Not on file    Attends religious service: Not on file    Active member of club or organization: Not on file    Attends meetings of clubs or organizations: Not on file    Relationship status: Not on file  . Intimate partner violence:    Fear of current or ex partner: Not on file    Emotionally abused: Not on file    Physically abused: Not on file    Forced sexual activity: Not on file  Other Topics Concern  . Not on file  Social History Narrative  . Not on file     Constitutional: Denies fever, malaise, fatigue, headache or abrupt weight changes.  HEENT: Denies eye pain, eye redness, ear pain, ringing in the ears, wax buildup, runny nose, nasal congestion, bloody nose, or sore throat. Respiratory: Pt reports occasional wheezing. Denies difficulty breathing, shortness of breath, cough or sputum production.   Cardiovascular: Denies chest pain, chest tightness, palpitations or swelling in the hands or feet.  Gastrointestinal: Denies abdominal pain, bloating, constipation, diarrhea or blood in the stool.  GU: Denies urgency, frequency, pain with urination, burning sensation, blood in urine, odor or discharge. Musculoskeletal: Pt reports intermittent joint pain. Denies decrease in range of motion, difficulty with gait, muscle pain or joint swelling.  Skin: Denies redness, rashes, lesions or ulcercations.  Neurological: Denies dizziness, difficulty with memory, difficulty with speech or problems with balance and coordination.  Psych: Denies anxiety, depression, SI/HI.  No other specific complaints in a complete review of systems (except as listed in HPI above).  Wt Readings from Last 3 Encounters:  04/07/18 261 lb (118.4 kg)  10/26/17 255 lb (115.7 kg)  10/17/17 266 lb (120.7 kg)    General: Appears her stated age, obese, in NAD. Skin: Warm, dry and intact.  Pulmonary/Chest: Normal effort. No respiratory distress.   Musculoskeletal: No signs of joint swelling.  Neurological: Alert and oriented.  Psychiatric: Mood and affect normal. Behavior is normal. Judgment and thought content normal.     BMET    Component Value Date/Time   NA 139 04/07/2018 1621   K 4.0 04/07/2018 1621   CL 100 04/07/2018 1621   CO2 31 04/07/2018 1621   GLUCOSE 92 04/07/2018 1621   BUN 16 04/07/2018 1621   CREATININE 0.70 04/07/2018 1621   CALCIUM 10.2 04/07/2018 1621    Lipid Panel     Component Value Date/Time   CHOL 247 (H) 07/11/2017 0848   TRIG 195.0 (H) 07/11/2017 0848   HDL 40.10 07/11/2017 0848   CHOLHDL 6 07/11/2017 0848   VLDL 39.0 07/11/2017 0848   LDLCALC 168 (H)  07/11/2017 0848    CBC    Component Value Date/Time   WBC 9.2 12/13/2016 1502   RBC 3.96 12/13/2016 1502   HGB 12.1 12/13/2016 1502   HCT 35.5 (L) 12/13/2016 1502   PLT 366.0 12/13/2016 1502   MCV 89.6 12/13/2016 1502   MCHC 34.2 12/13/2016 1502   RDW 13.8 12/13/2016 1502    Hgb A1C Lab Results  Component Value Date   HGBA1C 6.1 12/13/2016        Assessment and Plan:  See problem based charting  Follow Up Instructions:    I discussed the assessment and treatment plan with the patient. The patient was provided an opportunity to ask questions and all were answered. The patient agreed with the plan and demonstrated an understanding of the instructions.   The patient was advised to call back or seek an in-person evaluation if the symptoms worsen or if the condition fails to improve as anticipated.     Webb Silversmith, NP

## 2019-04-23 NOTE — Assessment & Plan Note (Signed)
Needs repeat bone density, but will wait until we can reschedule her annual exam Continue Calcium and Vit D Will have her set up lab only appt for repeat Vit D Encouraged daily weight bearing exercise

## 2019-04-23 NOTE — Assessment & Plan Note (Signed)
Continue QVAR and Albuterol Will have her do PFT's at her next visit Will monitor

## 2019-04-23 NOTE — Assessment & Plan Note (Signed)
Controlled on Lisinopril HCT Will have her make lab only appt for CMET Reinforced DASH diet and exercise for weight loss

## 2019-04-23 NOTE — Telephone Encounter (Signed)
Pt states she now has insurance & has scheduled an apt for AE. She is out of her cream & the itching is driving her out of her mind. She would like a refill ASAP.

## 2019-04-23 NOTE — Assessment & Plan Note (Signed)
Discussed how weight loss could help improve joint pain Continue Tylenol Arthritis/Ibuprofen as needed

## 2019-04-23 NOTE — Patient Instructions (Signed)

## 2019-04-23 NOTE — Assessment & Plan Note (Signed)
Will change Omeprazole to Pantoprazole now that she has insurance Discussed how weight loss could help improve reflux Will have her set up lab only appt for CBC and CMET Due for repeat upper GI, will follow with GI

## 2019-04-24 ENCOUNTER — Other Ambulatory Visit (INDEPENDENT_AMBULATORY_CARE_PROVIDER_SITE_OTHER): Payer: 59

## 2019-04-24 ENCOUNTER — Other Ambulatory Visit: Payer: Self-pay

## 2019-04-24 DIAGNOSIS — I10 Essential (primary) hypertension: Secondary | ICD-10-CM

## 2019-04-24 DIAGNOSIS — M170 Bilateral primary osteoarthritis of knee: Secondary | ICD-10-CM

## 2019-04-24 DIAGNOSIS — M816 Localized osteoporosis [Lequesne]: Secondary | ICD-10-CM | POA: Diagnosis not present

## 2019-04-24 DIAGNOSIS — K2270 Barrett's esophagus without dysplasia: Secondary | ICD-10-CM | POA: Diagnosis not present

## 2019-04-24 DIAGNOSIS — J4530 Mild persistent asthma, uncomplicated: Secondary | ICD-10-CM

## 2019-04-24 LAB — COMPREHENSIVE METABOLIC PANEL
ALT: 27 U/L (ref 0–35)
AST: 21 U/L (ref 0–37)
Albumin: 4.3 g/dL (ref 3.5–5.2)
Alkaline Phosphatase: 65 U/L (ref 39–117)
BUN: 18 mg/dL (ref 6–23)
CO2: 30 mEq/L (ref 19–32)
Calcium: 9.9 mg/dL (ref 8.4–10.5)
Chloride: 99 mEq/L (ref 96–112)
Creatinine, Ser: 0.63 mg/dL (ref 0.40–1.20)
GFR: 95.92 mL/min (ref >60.00)
Glucose, Bld: 109 mg/dL — ABNORMAL HIGH (ref 70–99)
Potassium: 4.5 mEq/L (ref 3.5–5.1)
Sodium: 138 mEq/L (ref 135–145)
Total Bilirubin: 0.7 mg/dL (ref 0.2–1.2)
Total Protein: 7 g/dL (ref 6.0–8.3)

## 2019-04-24 LAB — LIPID PANEL
Cholesterol: 242 mg/dL — ABNORMAL HIGH (ref 0–200)
HDL: 40.8 mg/dL (ref >39.00)
NonHDL: 200.92
Total CHOL/HDL Ratio: 6
Triglycerides: 237 mg/dL — ABNORMAL HIGH (ref 0.0–149.0)
VLDL: 47.4 mg/dL — ABNORMAL HIGH (ref 0.0–40.0)

## 2019-04-24 LAB — HEMOGLOBIN A1C: Hgb A1c MFr Bld: 6.3 % (ref 4.6–6.5)

## 2019-04-24 LAB — CBC
HCT: 36.1 % (ref 36.0–46.0)
Hemoglobin: 12.3 g/dL (ref 12.0–15.0)
MCHC: 34.1 g/dL (ref 30.0–36.0)
MCV: 89.7 fl (ref 78.0–100.0)
Platelets: 322 10*3/uL (ref 150.0–400.0)
RBC: 4.02 Mil/uL (ref 3.87–5.11)
RDW: 13.4 % (ref 11.5–15.5)
WBC: 6.9 10*3/uL (ref 4.0–10.5)

## 2019-04-24 LAB — LDL CHOLESTEROL, DIRECT: Direct LDL: 150 mg/dL

## 2019-04-24 LAB — VITAMIN D 25 HYDROXY (VIT D DEFICIENCY, FRACTURES): VITD: 25.23 ng/mL — ABNORMAL LOW (ref 30.00–100.00)

## 2019-04-24 NOTE — Telephone Encounter (Signed)
She needs an appt since not seen in 2 yrs and concern for different dx when I last did see her. I can see her Monday. She can try ice packs ext prn itch in meantime.

## 2019-04-24 NOTE — Telephone Encounter (Signed)
Please advise 

## 2019-04-24 NOTE — Telephone Encounter (Signed)
Called pt, no answer, LVMTRC. 

## 2019-04-24 NOTE — Telephone Encounter (Signed)
Transferred to Patricia Garner to schedule appt.

## 2019-04-27 ENCOUNTER — Ambulatory Visit: Payer: Managed Care, Other (non HMO) | Admitting: Obstetrics and Gynecology

## 2019-04-27 ENCOUNTER — Ambulatory Visit (INDEPENDENT_AMBULATORY_CARE_PROVIDER_SITE_OTHER): Payer: 59 | Admitting: Obstetrics and Gynecology

## 2019-04-27 ENCOUNTER — Encounter: Payer: Self-pay | Admitting: Obstetrics and Gynecology

## 2019-04-27 ENCOUNTER — Other Ambulatory Visit: Payer: Self-pay

## 2019-04-27 VITALS — BP 140/70 | Ht 63.0 in | Wt 269.0 lb

## 2019-04-27 DIAGNOSIS — N761 Subacute and chronic vaginitis: Secondary | ICD-10-CM | POA: Insufficient documentation

## 2019-04-27 LAB — POCT WET PREP WITH KOH
Clue Cells Wet Prep HPF POC: NEGATIVE
KOH Prep POC: NEGATIVE
Trichomonas, UA: NEGATIVE
Yeast Wet Prep HPF POC: NEGATIVE

## 2019-04-27 MED ORDER — CLOTRIMAZOLE-BETAMETHASONE 1-0.05 % EX CREA: TOPICAL_CREAM | CUTANEOUS | 0 refills | Status: DC

## 2019-04-27 MED ORDER — FLUCONAZOLE 150 MG PO TABS: 150.0000 mg | ORAL_TABLET | Freq: Once | ORAL | 0 refills | Status: AC

## 2019-04-27 NOTE — Progress Notes (Signed)
Patricia Fenton, NP   Chief Complaint  Patient presents with  . Vaginal Irritation    no discharge/odor    HPI:      Ms. Patricia Garner is a 63 y.o. [id number] who LMP was No LMP recorded. Patient is postmenopausal., presents today for vaginal irritation without increased d/c or odor for a few months. Hx of recurrent vaginitis in past, treated with lotrisone crm in past. Rx ran out about 3 months ago. Uses a few times prn (not BID for 14 days) with sx relief. Sex can increase sx. Used to have issues with increased sweating but not as much now. Pt had normal DM screen with PCP 4/20. Pt uses dove sens skin soap and dryer sheets. Denies urin sx. Last seen 4/18. Pt had requested Rx RF but needed to be seen. Concern for LS at 4/18 appt.   Pt has annual sched 7/20.   Past Medical History:  Diagnosis Date  . Allergy   . Arthritis   . Asthma   . Barrett esophagus   . Chicken pox   . GERD (gastroesophageal reflux disease)   . History of colon polyps   . Hypertension   . Osteoporosis     Past Surgical History:  Procedure Laterality Date  . CHOLECYSTECTOMY  1983  . COLONOSCOPY WITH PROPOFOL N/A 08/28/2016   Procedure: COLONOSCOPY WITH PROPOFOL;  Surgeon: Lollie Sails, MD;  Location: [facility] ENDOSCOPY;  Service: Endoscopy;  Laterality: N/A;  . ESOPHAGOGASTRODUODENOSCOPY (EGD) WITH PROPOFOL N/A 08/28/2016   Procedure: ESOPHAGOGASTRODUODENOSCOPY (EGD) WITH PROPOFOL;  Surgeon: Lollie Sails, MD;  Location: [facility] ENDOSCOPY;  Service: Endoscopy;  Laterality: N/A;  . TONSILLECTOMY  1969    Family History  Problem Relation Age of Onset  . Arthritis Mother   . Heart disease Mother   . Stroke Mother   . Hypertension Mother   . Alcohol abuse Father   . Alcohol abuse Sister   . Mental illness Sister   . Stomach cancer Sister 41  . Alcohol abuse Brother   . Drug abuse Brother   . Arthritis Maternal Grandmother   . Stroke Maternal Grandmother   . Colon cancer Maternal Grandfather 64     Social History   Socioeconomic History  . Marital status: Married    Spouse name: Not on file  . Number of children: Not on file  . Years of education: Not on file  . Highest education level: Not on file  Occupational History  . Not on file  Social Needs  . Financial resource strain: Not on file  . Food insecurity:    Worry: Not on file    Inability: Not on file  . Transportation needs:    Medical: Not on file    Non-medical: Not on file  Tobacco Use  . Smoking status: Never Smoker  . Smokeless tobacco: Never Used  Substance and Sexual Activity  . Alcohol use: No    Alcohol/week: 0.0 standard drinks  . Drug use: No  . Sexual activity: Yes  Lifestyle  . Physical activity:    Days per week: Not on file    Minutes per session: Not on file  . Stress: Not on file  Relationships  . Social connections:    Talks on phone: Not on file    Gets together: Not on file    Attends religious service: Not on file    Active member of club or organization: Not on file    Attends meetings  of clubs or organizations: Not on file    Relationship status: Not on file  . Intimate partner violence:    Fear of current or ex partner: Not on file    Emotionally abused: Not on file    Physically abused: Not on file    Forced sexual activity: Not on file  Other Topics Concern  . Not on file  Social History Narrative  . Not on file    Outpatient Medications Prior to Visit  Medication Sig Dispense Refill  . albuterol (PROVENTIL HFA) 108 (90 Base) MCG/ACT inhaler INHALE ONE TO TWO PUFFS BY MOUTH EVERY 6 HOURS AS NEEDED FOR WHEEZING OR SHORTNESS OF BREATH 1 Inhaler 5  . Calcium-Vitamin D 600-200 MG-UNIT tablet Take by mouth.    . [facility] ZINC 50 MG TABS Take by mouth.    . ferrous sulfate 325 (65 FE) MG EC tablet Take by mouth.    Marland Kitchen lisinopril-hydrochlorothiazide (ZESTORETIC) 20-25 MG tablet Take 1 tablet by mouth daily. 90 tablet 2  . Multiple Vitamins-Minerals (MULTIVITAMIN WITH MINERALS)  tablet Take by mouth.    . pantoprazole (PROTONIX) 40 MG tablet Take 1 tablet (40 mg total) by mouth daily. 90 tablet 2   No facility-administered medications prior to visit.       ROS:  Review of Systems  Constitutional: Negative for fever.  Gastrointestinal: Negative for blood in stool, constipation, diarrhea, nausea and vomiting.  Genitourinary: Negative for dyspareunia, dysuria, flank pain, frequency, hematuria, urgency, vaginal bleeding, vaginal discharge and vaginal pain.  Musculoskeletal: Negative for back pain.  Skin: Negative for rash.    OBJECTIVE:   Vitals:  BP 140/70   Ht 5\' 3"  (1.6 m)   Wt 269 lb (122 kg)   BMI 47.65 kg/m   Physical Exam Vitals signs reviewed.  Constitutional:      Appearance: She is well-developed.  Neck:     Musculoskeletal: Normal range of motion.  Pulmonary:     Effort: Pulmonary effort is normal.  Genitourinary:    Pubic Area: No rash.      Labia:        Right: No rash, tenderness or lesion.        Left: No rash, tenderness or lesion.      Vagina: Normal. No vaginal discharge, erythema or tenderness.     Cervix: Normal.     Uterus: Normal. Not enlarged and not tender.      Adnexa: Right adnexa normal and left adnexa normal.       Right: No mass or tenderness.         Left: No mass or tenderness.       Comments: BILAT LABIA MAJORA WITH MILD HYPERTROPHY AND IRRITATION; PERINEAL AREA WITH MILD HYPERTROPHY AND FISSURE Musculoskeletal: Normal range of motion.  Skin:    General: Skin is warm and dry.  Neurological:     General: No focal deficit present.     Mental Status: She is alert and oriented to person, place, and time.  Psychiatric:        Mood and Affect: Mood normal.        Behavior: Behavior normal.        Thought Content: Thought content normal.        Judgment: Judgment normal.     Results: Results for orders placed or performed in visit on 04/27/19 (from the past 24 hour(s))  POCT Wet Prep with KOH     Status:  Normal   Collection Time: 04/27/19  3:29  PM  Result Value Ref Range   Trichomonas, UA Negative    Clue Cells Wet Prep HPF POC neg    Epithelial Wet Prep HPF POC     Yeast Wet Prep HPF POC neg    Bacteria Wet Prep HPF POC     RBC Wet Prep HPF POC     WBC Wet Prep HPF POC     KOH Prep POC Negative Negative     Assessment/Plan: Chronic vaginitis - Neg wet prep/pos exam. Doesn't look like LS. Rx diflucan and RF lotrisone crm BID for 2 wks and then prn. F/u at 7/20 annual/sooner prn. D/C dryer sheets.  - Plan: fluconazole (DIFLUCAN) 150 MG tablet, clotrimazole-betamethasone (LOTRISONE) cream, POCT Wet Prep with KOH    Meds ordered this encounter  Medications  . fluconazole (DIFLUCAN) 150 MG tablet    Sig: Take 1 tablet (150 mg total) by mouth once for 1 dose.    Dispense:  1 tablet    Refill:  0    Order Specific Question:   Supervising Provider    Answer:   Gae Dry [id number]  . clotrimazole-betamethasone (LOTRISONE) cream    Sig: Apply externally BID for 2 wks, then BID prn sx    Dispense:  45 g    Refill:  0    Order Specific Question:   Supervising Provider    Answer:   Gae Dry [[id number]]      Return if symptoms worsen or fail to improve.  Rhylan Kagel B. Lacey Wallman, PA-C 04/27/2019 3:32 PM

## 2019-04-27 NOTE — Patient Instructions (Signed)
I value your feedback and entrusting us with your care. If you get a  patient survey, I would appreciate you taking the time to let us know about your experience today. Thank you! 

## 2019-07-01 ENCOUNTER — Encounter: Payer: Self-pay | Admitting: Obstetrics and Gynecology

## 2019-07-01 NOTE — Progress Notes (Signed)
Chief Complaint  Patient presents with  . Gynecologic Exam    HPI:      Ms. Patricia Garner is a 63 y.o. [id number] who LMP was No LMP recorded. Patient is postmenopausal., presents today for her annual examination.  Her menses are absent due to menopause. Dysmenorrhea none. She does not have postmenopausal bleeding. She does not have vasomotor sx.   Sex activity: single partner, contraception - post menopausal status. She does have vaginal dryness. She uses lubricants with relief.   Last Pap: November 16, 2015  Results were: no abnormalities /neg HPV DNA.   She has a hx of chronic vaginal irritation. Dr. Ammie Dalton gave her mycolog crm that helped initially, but sx persisted. She sweats a lot vaginally. Rx changed to lotrisone crm 4/18 with sx improvement. Pt seen for sx f/u 04/27/19 given diflucan and lotrisone RF due to severity. Didn't look like LS. Pt states sx improve when used but then recurs. Not as bothersome today.  Last mammogram: 12/05/17 at [facility]  Results were: normal--routine follow-up in 12 months There is no FH of breast cancer. There is no FH of ovarian cancer. The patient does not do self-breast exams.  Colonoscopy: colonoscopy 2017; Repeat after 5 yrs per pt report.  Tobacco use: The patient denies current or previous tobacco use. Alcohol use: none Exercise: not active  She does get adequate calcium and Vitamin D in her diet. Labs with PCP. Has hyperlipidemia and Vit D deficiency.    Past Medical History:  Diagnosis Date  . Allergy   . Arthritis   . Asthma   . Barrett esophagus   . Chicken pox   . Chronic vaginitis   . GERD (gastroesophageal reflux disease)   . History of colon polyps   . Hyperlipidemia   . Hypertension   . Osteoporosis   . Vitamin D deficiency     Past Surgical History:  Procedure Laterality Date  . CHOLECYSTECTOMY  1983  . COLONOSCOPY WITH PROPOFOL N/A 08/28/2016   Procedure: COLONOSCOPY WITH PROPOFOL;  Surgeon: Lollie Sails, MD;   [facility] ENDOSCOPY;  Service: Endoscopy;  Laterality: N/A;  . ESOPHAGOGASTRODUODENOSCOPY (EGD) WITH PROPOFOL N/A 08/28/2016   Procedure: ESOPHAGOGASTRODUODENOSCOPY (EGD) WITH PROPOFOL;  Surgeon: Lollie Sails, MD;  Location[facility] ENDOSCOPY;  Service: Endoscopy;  Laterality: N/A;  . TONSILLECTOMY  1969    Family History  Problem Relation Age of Onset  . Arthritis Mother   . Heart disease Mother   . Stroke Mother   . Hypertension Mother   . Alcohol abuse Father   . Alcohol abuse Sister   . Mental illness Sister   . Stomach cancer Sister 46  . Alcohol abuse Brother   . Drug abuse Brother   . Arthritis Maternal Grandmother   . Stroke Maternal Grandmother   . Colon cancer Maternal Grandfather 33    Social History   Socioeconomic History  . Marital status: Married    Spouse name: Not on file  . Number of children: Not on file  . Years of education: Not on file  . Highest education level: Not on file  Occupational History  . Not on file  Social Needs  . Financial resource strain: Not on file  . Food insecurity    Worry: Not on file    Inability: Not on file  . Transportation needs    Medical: Not on file    Non-medical: Not on file  Tobacco Use  . Smoking status: Never Smoker  .  Smokeless tobacco: Never Used  Substance and Sexual Activity  . Alcohol use: No    Alcohol/week: 0.0 standard drinks  . Drug use: No  . Sexual activity: Not Currently    Birth control/protection: Post-menopausal  Lifestyle  . Physical activity    Days per week: Not on file    Minutes per session: Not on file  . Stress: Not on file  Relationships  . Social Herbalist on phone: Not on file    Gets together: Not on file    Attends religious service: Not on file    Active member of club or organization: Not on file    Attends meetings of clubs or organizations: Not on file    Relationship status: Not on file  . Intimate partner violence    Fear of current or ex partner:  Not on file    Emotionally abused: Not on file    Physically abused: Not on file    Forced sexual activity: Not on file  Other Topics Concern  . Not on file  Social History Narrative  . Not on file     Current Outpatient Medications:  .  albuterol (PROVENTIL HFA) 108 (90 Base) MCG/ACT inhaler, INHALE ONE TO TWO PUFFS BY MOUTH EVERY 6 HOURS AS NEEDED FOR WHEEZING OR SHORTNESS OF BREATH, Disp: 1 Inhaler, Rfl: 5 .  Calcium-Vitamin D 600-200 MG-UNIT tablet, Take by mouth., Disp: , Rfl:  .  clotrimazole-betamethasone (LOTRISONE) cream, Apply externally BID for 2 wks, then BID prn sx, Disp: 45 g, Rfl: 0 .  ferrous sulfate 325 (65 FE) MG EC tablet, Take by mouth., Disp: , Rfl:  .  lisinopril-hydrochlorothiazide (ZESTORETIC) 20-25 MG tablet, Take 1 tablet by mouth daily., Disp: 90 tablet, Rfl: 2 .  Multiple Vitamins-Minerals (MULTIVITAMIN WITH MINERALS) tablet, Take by mouth., Disp: , Rfl:  .  pantoprazole (PROTONIX) 40 MG tablet, Take 1 tablet (40 mg total) by mouth daily., Disp: 90 tablet, Rfl: 2   ROS:  Review of Systems  Constitutional: Negative for fever, malaise/fatigue and weight loss.  HENT: Negative for congestion, ear pain and sinus pain.   Respiratory: Negative for cough, shortness of breath and wheezing.   Cardiovascular: Negative for chest pain, orthopnea and leg swelling.  Gastrointestinal: Negative for constipation, diarrhea, nausea and vomiting.  Genitourinary: Negative for dysuria, frequency, hematuria and urgency.       Breast ROS: negative   Musculoskeletal: Positive for joint pain. Negative for back pain and myalgias.  Skin: Negative for itching and rash.  Neurological: Negative for dizziness, tingling, focal weakness and headaches.  Endo/Heme/Allergies: Negative for environmental allergies. Does not bruise/bleed easily.  Psychiatric/Behavioral: Negative for depression and suicidal ideas. The patient is not nervous/anxious and does not have insomnia.     Objective:  BP (!) 146/88   Ht 5\' 3"  (1.6 m)   Wt 278 lb 9.6 oz (126.4 kg)   BMI 49.35 kg/m    Physical Exam Constitutional:      Appearance: She is well-developed.  Genitourinary:     Vulva, vagina, uterus, right adnexa and left adnexa normal.     No vulval lesion or tenderness noted.     No vaginal discharge, erythema or tenderness.     No cervical motion tenderness or polyp.     Uterus is not enlarged or tender.     No right or left adnexal mass present.     Right adnexa not tender.     Left adnexa not tender.  Genitourinary Comments: EXT EXAM WNL; VAGINITIS MUCH IMPROVED  Neck:     Musculoskeletal: Normal range of motion.     Thyroid: No thyromegaly.  Cardiovascular:     Rate and Rhythm: Normal rate and regular rhythm.     Heart sounds: Normal heart sounds. No murmur.  Pulmonary:     Effort: Pulmonary effort is normal.     Breath sounds: Normal breath sounds.  Chest:     Breasts:        Right: No mass, nipple discharge, skin change or tenderness.        Left: No mass, nipple discharge, skin change or tenderness.  Abdominal:     Palpations: Abdomen is soft.     Tenderness: There is no abdominal tenderness. There is no guarding.  Musculoskeletal: Normal range of motion.  Neurological:     General: No focal deficit present.     Mental Status: She is alert and oriented to person, place, and time.     Cranial Nerves: No cranial nerve deficit.  Skin:    General: Skin is warm and dry.  Psychiatric:        Mood and Affect: Mood normal.        Behavior: Behavior normal.        Thought Content: Thought content normal.        Judgment: Judgment normal.  Vitals signs reviewed.      Assessment/Plan:  Encounter for annual routine gynecological examination -   Cervical cancer screening - Plan: Cytology - PAP,   Screening for HPV (human papillomavirus) - Plan: Cytology - PAP,   Screening for breast cancer - Plan: MM 3D SCREEN BREAST BILATERAL,Pt to sched mammo  Chronic  vaginitis - Plan: clotrimazole-betamethasone (LOTRISONE) cream, Looks good today. Keep area dry. Try A&D oint/Desitin for moisture barrier. Rx RF lotrisone crm prn. F/u prn.    Meds ordered this encounter  Medications  . clotrimazole-betamethasone (LOTRISONE) cream    Sig: Apply externally BID for 2 wks, then BID prn sx    Dispense:  45 g    Refill:  0    Order Specific Question:   Supervising Provider    Answer:   Gae Dry [[id number]]            GYN counsel mammography screening, adequate intake of calcium and vitamin D, diet and exercise     F/U  Return in about 1 year (around 07/01/2020).   B. , PA-C 07/02/2019 9:27 AM

## 2019-07-02 ENCOUNTER — Other Ambulatory Visit (HOSPITAL_COMMUNITY)
Admission: RE | Admit: 2019-07-02 | Discharge: 2019-07-02 | Disposition: A | Discharge status: Home / Self Care | Payer: 59 | Source: Ambulatory Visit | Attending: Obstetrics and Gynecology | Admitting: Obstetrics and Gynecology

## 2019-07-02 ENCOUNTER — Other Ambulatory Visit: Payer: Self-pay

## 2019-07-02 ENCOUNTER — Ambulatory Visit (INDEPENDENT_AMBULATORY_CARE_PROVIDER_SITE_OTHER): Payer: 59 | Admitting: Obstetrics and Gynecology

## 2019-07-02 ENCOUNTER — Encounter: Payer: Self-pay | Admitting: Obstetrics and Gynecology

## 2019-07-02 VITALS — BP 146/88 | Ht 63.0 in | Wt 278.6 lb

## 2019-07-02 DIAGNOSIS — Z1151 Encounter for screening for human papillomavirus (HPV): Secondary | ICD-10-CM | POA: Insufficient documentation

## 2019-07-02 DIAGNOSIS — N761 Subacute and chronic vaginitis: Secondary | ICD-10-CM

## 2019-07-02 DIAGNOSIS — Z01419 Encounter for gynecological examination (general) (routine) without abnormal findings: Secondary | ICD-10-CM | POA: Diagnosis not present

## 2019-07-02 DIAGNOSIS — Z124 Encounter for screening for malignant neoplasm of cervix: Secondary | ICD-10-CM

## 2019-07-02 DIAGNOSIS — Z1239 Encounter for other screening for malignant neoplasm of breast: Secondary | ICD-10-CM

## 2019-07-02 MED ORDER — CLOTRIMAZOLE-BETAMETHASONE 1-0.05 % EX CREA: TOPICAL_CREAM | CUTANEOUS | 0 refills | Status: DC

## 2019-07-02 NOTE — Patient Instructions (Signed)
I value your feedback and entrusting us with your care. If you get a [facility] patient survey, I would appreciate you taking the time to let us know about your experience today. Thank you!  [facility]: [phone]    

## 2019-07-07 LAB — CYTOLOGY - PAP
Diagnosis: NEGATIVE
HPV: NOT DETECTED

## 2019-07-27 ENCOUNTER — Telehealth: Payer: Self-pay | Admitting: *Deleted

## 2019-07-27 NOTE — Telephone Encounter (Signed)
Looks like she was requesting Rx that had no refills but another refill had been sent in 2 days after that one with 2 additional refills... pt says that she will call the pharmacy

## 2019-07-27 NOTE — Telephone Encounter (Signed)
Patient left a voicemail stating that she requested a refill on her blood pressure medication last Monday. Patient stated that she only has 2 pills left. Patient is wondering if it was not refilled because she needs to come in for an appointment? . Patient requested a call back as to why the medication was not refilled?

## 2019-07-27 NOTE — Telephone Encounter (Signed)
Ok to refill lisinopril hct

## 2019-08-18 ENCOUNTER — Ambulatory Visit
Admission: RE | Admit: 2019-08-18 | Discharge: 2019-08-18 | Disposition: A | Discharge status: Home / Self Care | Payer: 59 | Source: Ambulatory Visit | Attending: Obstetrics and Gynecology | Admitting: Obstetrics and Gynecology

## 2019-08-18 ENCOUNTER — Other Ambulatory Visit: Payer: Self-pay

## 2019-08-18 DIAGNOSIS — Z1231 Encounter for screening mammogram for malignant neoplasm of breast: Secondary | ICD-10-CM | POA: Diagnosis not present

## 2019-08-18 DIAGNOSIS — Z1239 Encounter for other screening for malignant neoplasm of breast: Secondary | ICD-10-CM

## 2019-09-11 ENCOUNTER — Other Ambulatory Visit
Admission: RE | Admit: 2019-09-11 | Discharge: 2019-09-11 | Disposition: A | Discharge status: Home / Self Care | Payer: 59 | Source: Ambulatory Visit | Attending: Gastroenterology | Admitting: Gastroenterology

## 2019-09-11 ENCOUNTER — Other Ambulatory Visit: Payer: Self-pay

## 2019-09-11 DIAGNOSIS — Z20828 Contact with and (suspected) exposure to other viral communicable diseases: Secondary | ICD-10-CM | POA: Insufficient documentation

## 2019-09-11 DIAGNOSIS — Z01812 Encounter for preprocedural laboratory examination: Secondary | ICD-10-CM | POA: Diagnosis not present

## 2019-09-12 LAB — SARS CORONAVIRUS 2 (TAT 6-24 HRS): SARS Coronavirus 2: NEGATIVE

## 2019-09-14 ENCOUNTER — Encounter: Payer: Self-pay | Admitting: *Deleted

## 2019-09-15 ENCOUNTER — Other Ambulatory Visit: Payer: Self-pay

## 2019-09-15 ENCOUNTER — Encounter
Admission: RE | Disposition: A | Discharge status: Home / Self Care | Payer: Self-pay | Source: Ambulatory Visit | Attending: Gastroenterology

## 2019-09-15 ENCOUNTER — Ambulatory Visit: Payer: 59 | Admitting: Certified Registered Nurse Anesthetist

## 2019-09-15 ENCOUNTER — Encounter: Payer: Self-pay | Admitting: *Deleted

## 2019-09-15 ENCOUNTER — Ambulatory Visit
Admission: RE | Admit: 2019-09-15 | Discharge: 2019-09-15 | Disposition: A | Discharge status: Home / Self Care | Payer: 59 | Source: Ambulatory Visit | Attending: Gastroenterology | Admitting: Gastroenterology

## 2019-09-15 DIAGNOSIS — Z888 Allergy status to other drugs, medicaments and biological substances status: Secondary | ICD-10-CM | POA: Diagnosis not present

## 2019-09-15 DIAGNOSIS — K449 Diaphragmatic hernia without obstruction or gangrene: Secondary | ICD-10-CM | POA: Insufficient documentation

## 2019-09-15 DIAGNOSIS — Z881 Allergy status to other antibiotic agents status: Secondary | ICD-10-CM | POA: Insufficient documentation

## 2019-09-15 DIAGNOSIS — Z79899 Other long term (current) drug therapy: Secondary | ICD-10-CM | POA: Diagnosis not present

## 2019-09-15 DIAGNOSIS — M1990 Unspecified osteoarthritis, unspecified site: Secondary | ICD-10-CM | POA: Insufficient documentation

## 2019-09-15 DIAGNOSIS — I10 Essential (primary) hypertension: Secondary | ICD-10-CM | POA: Diagnosis not present

## 2019-09-15 DIAGNOSIS — Z882 Allergy status to sulfonamides status: Secondary | ICD-10-CM | POA: Diagnosis not present

## 2019-09-15 DIAGNOSIS — Z885 Allergy status to narcotic agent status: Secondary | ICD-10-CM | POA: Insufficient documentation

## 2019-09-15 DIAGNOSIS — K317 Polyp of stomach and duodenum: Secondary | ICD-10-CM | POA: Insufficient documentation

## 2019-09-15 DIAGNOSIS — Z8719 Personal history of other diseases of the digestive system: Secondary | ICD-10-CM | POA: Insufficient documentation

## 2019-09-15 DIAGNOSIS — Z09 Encounter for follow-up examination after completed treatment for conditions other than malignant neoplasm: Secondary | ICD-10-CM | POA: Diagnosis present

## 2019-09-15 DIAGNOSIS — J45909 Unspecified asthma, uncomplicated: Secondary | ICD-10-CM | POA: Insufficient documentation

## 2019-09-15 HISTORY — PX: ESOPHAGOGASTRODUODENOSCOPY (EGD) WITH PROPOFOL: SHX5813

## 2019-09-15 SURGERY — ESOPHAGOGASTRODUODENOSCOPY (EGD) WITH PROPOFOL
Anesthesia: General

## 2019-09-15 MED ORDER — PROPOFOL 500 MG/50ML IV EMUL
INTRAVENOUS | Status: DC | PRN
  Administered 2019-09-15: 100 ug/kg/min via INTRAVENOUS

## 2019-09-15 MED ORDER — PROPOFOL 10 MG/ML IV BOLUS
INTRAVENOUS | Status: AC
  Filled 2019-09-15: qty 60

## 2019-09-15 MED ORDER — PROPOFOL 10 MG/ML IV BOLUS
INTRAVENOUS | Status: DC | PRN
  Administered 2019-09-15 (×3): 20 mg via INTRAVENOUS
  Administered 2019-09-15: 80 mg via INTRAVENOUS

## 2019-09-15 MED ORDER — SODIUM CHLORIDE 0.9 % IV SOLN
INTRAVENOUS | Status: DC
  Administered 2019-09-15: 10:00:00 via INTRAVENOUS

## 2019-09-15 MED ORDER — LIDOCAINE HCL (CARDIAC) PF 100 MG/5ML IV SOSY
PREFILLED_SYRINGE | INTRAVENOUS | Status: DC | PRN
  Administered 2019-09-15: 100 mg via INTRAVENOUS

## 2019-09-15 MED ORDER — SODIUM CHLORIDE 0.9 % IV SOLN
INTRAVENOUS | Status: DC
  Administered 2019-09-15: 1000 mL via INTRAVENOUS

## 2019-09-15 NOTE — H&P (Signed)
Outpatient short stay form Pre-procedure 09/15/2019 10:04 AM Patricia Sails MD  Primary Physician: Golden Hurter, NP  Reason for visit: EGD  History of present illness: Patient is a 63 year old female presenting today for an EGD in regards her personal history of Barrett's esophagus.  Her last EGD was 08/28/2016.  This verified her Barrett's without dysplasia.  Presenting today for routine follow-up.  She does occasionally get some heartburn.  We discussed the timing of her proton pump inhibitor.  There is no dysphagia.    Current Facility-Administered Medications:  .  0.9 %  sodium chloride infusion, , Intravenous, Continuous, Patricia Sails, MD .  0.9 %  sodium chloride infusion, , Intravenous, Continuous, Patricia Sails, MD, Last Rate: 20 mL/hr at 09/15/19 1003, 1,000 mL at 09/15/19 1003  Medications Prior to Admission  Medication Sig Dispense Refill Last Dose  . Calcium-Vitamin D 600-200 MG-UNIT tablet Take by mouth.   Past Week at Unknown time  . clotrimazole-betamethasone (LOTRISONE) cream Apply externally BID for 2 wks, then BID prn sx 45 g 0 Past Week at Unknown time  . lisinopril-hydrochlorothiazide (ZESTORETIC) 20-25 MG tablet Take 1 tablet by mouth daily. 90 tablet 2 09/15/2019 at 0545  . Multiple Vitamins-Minerals (MULTIVITAMIN WITH MINERALS) tablet Take by mouth.   Past Week at Unknown time  . pantoprazole (PROTONIX) 40 MG tablet Take 1 tablet (40 mg total) by mouth daily. 90 tablet 2 09/14/2019 at Unknown time  . albuterol (PROVENTIL HFA) 108 (90 Base) MCG/ACT inhaler INHALE ONE TO TWO PUFFS BY MOUTH EVERY 6 HOURS AS NEEDED FOR WHEEZING OR SHORTNESS OF BREATH 1 Inhaler 5  at prn  . ferrous sulfate 325 (65 FE) MG EC tablet Take by mouth.   Not Taking at Unknown time     Allergies  Allergen Reactions  . Bactrim [Sulfamethoxazole-Trimethoprim] Nausea Only  . Codeine Nausea And Vomiting  . Hydrocodone Hives  . Lipitor [Atorvastatin] Other (See Comments)    Myalgia    . Medrol [Methylprednisolone] Nausea Only     Past Medical History:  Diagnosis Date  . Allergy   . Arthritis   . Asthma   . Barrett esophagus   . Chicken pox   . Chronic vaginitis   . GERD (gastroesophageal reflux disease)   . History of colon polyps   . Hyperlipidemia   . Hypertension   . Osteoporosis   . Vitamin D deficiency     Review of systems:      Physical Exam    Heart and lungs: Regular rate and rhythm without rub or gallop lungs are bilaterally clear    HEENT: Normocephalic atraumatic eyes are anicteric    Other:    Pertinant exam for procedure: Soft nontender nondistended bowel sounds positive normoactive    Planned proceedures: EGD and indicated procedures. I have discussed the risks benefits and complications of procedures to include not limited to bleeding, infection, perforation and the risk of sedation and the patient wishes to proceed.    Patricia Sails, MD Gastroenterology 09/15/2019  10:04 AM

## 2019-09-15 NOTE — Op Note (Signed)
[facility] Gastroenterology Patient Name: Patricia Garner Procedure Date: 09/15/2019 10:14 AM MRN: [id number] Account #: [id number] Date of Birth: 06/27/1956 Admit Type: Outpatient Age: 63 Room: [facility] ENDO ROOM 3 Gender: Female Note Status: Finalized Procedure:            Upper GI endoscopy Indications:          Follow-up of Barrett's esophagus Providers:            Lollie Sails, MD Referring MD:         [name] (Referring MD) Medicines:            Monitored Anesthesia Care Complications:        No immediate complications. Procedure:            Pre-Anesthesia Assessment:                       - ASA Grade Assessment: III - A patient with severe                        systemic disease.                       After obtaining informed consent, the endoscope was                        passed under direct vision. Throughout the procedure,                        the patient's blood pressure, pulse, and oxygen                        saturations were monitored continuously. The Endoscope                        was introduced through the mouth, and advanced to the                        third part of duodenum. The upper GI endoscopy was                        accomplished without difficulty. The patient tolerated                        the procedure. Findings:      There were esophageal mucosal changes consistent with short-segment       Barrett's esophagus present in the lower third of the esophagus. The       maximum longitudinal extent of these mucosal changes was 1 cm in length.       Mucosa was biopsied with a cold forceps in a directed quadrant fashion       for histology. One specimen bottle was sent to pathology.      A small hiatal hernia was found. The Z-line was a variable distance from       incisors; the hiatal hernia was sliding.      The exam of the esophagus was otherwise normal.      Multiple 1 to 3 mm sessile polyps with no bleeding and no  stigmata of       recent bleeding were found in the gastric body. Biopsies were taken with  a cold forceps for histology.      The cardia and gastric fundus were normal on retroflexion.      The examined duodenum was normal otherwise. Impression:           - Esophageal mucosal changes consistent with                        short-segment Barrett's esophagus. Biopsied.                       - Small hiatal hernia.                       - Multiple gastric polyps. Biopsied.                       - Normal examined duodenum. Recommendation:       - Await pathology results.                       - Continue present medications.                       - Telephone GI clinic for pathology results in 5 days. Procedure Code(s):    --- Professional ---                       [phone], Esophagogastroduodenoscopy, flexible, transoral;                        with biopsy, single or multiple CPT copyright 2019 American Medical Association. All rights reserved. The codes documented in this report are preliminary and upon coder review may  be revised to meet current compliance requirements. Lollie Sails, MD 09/15/2019 10:38:47 AM This report has been signed electronically. Number of Addenda: 0 Note Initiated On: 09/15/2019 10:14 AM      [facility]

## 2019-09-15 NOTE — Transfer of Care (Signed)
Immediate Anesthesia Transfer of Care Note  Patient: Patricia Garner  Procedure(s) Performed: ESOPHAGOGASTRODUODENOSCOPY (EGD) WITH PROPOFOL (N/A )  Patient Location: PACU and Endoscopy Unit  Anesthesia Type:General  Level of Consciousness: awake, oriented and patient cooperative  Airway & Oxygen Therapy: Patient Spontanous Breathing  Post-op Assessment: Report given to RN and Post -op Vital signs reviewed and stable  Post vital signs: Reviewed and stable  Last Vitals:  Vitals Value Taken Time  BP 108/60 09/15/19 1042  Temp    Pulse 74 09/15/19 1043  Resp 20 09/15/19 1043  SpO2 96 % 09/15/19 1043  Vitals shown include unvalidated device data.  Last Pain:  Vitals:   09/15/19 1002  TempSrc: Tympanic  PainSc: 0-No pain         Complications: No apparent anesthesia complications

## 2019-09-15 NOTE — Anesthesia Post-op Follow-up Note (Signed)
Anesthesia QCDR form completed.        

## 2019-09-16 ENCOUNTER — Encounter: Payer: Self-pay | Admitting: Gastroenterology

## 2019-09-16 LAB — SURGICAL PATHOLOGY

## 2019-09-22 NOTE — Anesthesia Preprocedure Evaluation (Signed)
Anesthesia Evaluation  Patient identified by MRN, date of birth, ID band Patient awake    Reviewed: Allergy & Precautions, H&P , NPO status , Patient's Chart, lab work & pertinent test results, reviewed documented beta blocker date and time   Airway Mallampati: II   Neck ROM: full    Dental  (+) Teeth Intact   Pulmonary neg pulmonary ROS,    Pulmonary exam normal        Cardiovascular hypertension, negative cardio ROS Normal cardiovascular exam Rhythm:regular Rate:Normal     Neuro/Psych negative neurological ROS  negative psych ROS   GI/Hepatic negative GI ROS, Neg liver ROS,   Endo/Other  negative endocrine ROS  Renal/GU negative Renal ROS  negative genitourinary   Musculoskeletal   Abdominal   Peds  Hematology negative hematology ROS (+)   Anesthesia Other Findings Past Medical History: No date: Allergy No date: Arthritis No date: Asthma No date: Barrett esophagus No date: Chicken pox No date: Chronic vaginitis No date: GERD (gastroesophageal reflux disease) No date: History of colon polyps No date: Hyperlipidemia No date: Hypertension No date: Osteoporosis No date: Vitamin D deficiency Past Surgical History: 1983: CHOLECYSTECTOMY 08/28/2016: COLONOSCOPY WITH PROPOFOL; N/A     Comment:  Procedure: COLONOSCOPY WITH PROPOFOL;  Surgeon: Lollie Sails, MD;  Location: [facility] ENDOSCOPY;  Service:               Endoscopy;  Laterality: N/A; 08/28/2016: ESOPHAGOGASTRODUODENOSCOPY (EGD) WITH PROPOFOL; N/A     Comment:  Procedure: ESOPHAGOGASTRODUODENOSCOPY (EGD) WITH               PROPOFOL;  Surgeon: Lollie Sails, MD;  Location:               [facility] ENDOSCOPY;  Service: Endoscopy;  Laterality: N/A; 09/15/2019: ESOPHAGOGASTRODUODENOSCOPY (EGD) WITH PROPOFOL; N/A     Comment:  Procedure: ESOPHAGOGASTRODUODENOSCOPY (EGD) WITH               PROPOFOL;  Surgeon: Lollie Sails, MD;  Location:             [facility] ENDOSCOPY;  Service: Endoscopy;  Laterality: N/A; 1969: TONSILLECTOMY BMI    Body Mass Index: 49.38 kg/m     Reproductive/Obstetrics negative OB ROS                             Anesthesia Physical Anesthesia Plan  ASA: III  Anesthesia Plan: General   Post-op Pain Management:    Induction:   PONV Risk Score and Plan:   Airway Management Planned:   Additional Equipment:   Intra-op Plan:   Post-operative Plan:   Informed Consent: I have reviewed the patients History and Physical, chart, labs and discussed the procedure including the risks, benefits and alternatives for the proposed anesthesia with the patient or authorized representative who has indicated his/her understanding and acceptance.     Dental Advisory Given  Plan Discussed with: CRNA  Anesthesia Plan Comments:         Anesthesia Quick Evaluation

## 2019-09-22 NOTE — Anesthesia Postprocedure Evaluation (Signed)
Anesthesia Post Note  Patient: Patricia Garner  Procedure(s) Performed: ESOPHAGOGASTRODUODENOSCOPY (EGD) WITH PROPOFOL (N/A )  Patient location during evaluation: PACU Anesthesia Type: General Level of consciousness: awake and alert Pain management: pain level controlled Vital Signs Assessment: post-procedure vital signs reviewed and stable Respiratory status: spontaneous breathing, nonlabored ventilation, respiratory function stable and patient connected to nasal cannula oxygen Cardiovascular status: stable and blood pressure returned to baseline Postop Assessment: no apparent nausea or vomiting Anesthetic complications: no     Last Vitals:  Vitals:   09/15/19 1002 09/15/19 1042  BP: 126/66   Pulse: 78   Resp: 18   Temp: 36.5 C 36.5 C  SpO2: 98%     Last Pain:  Vitals:   09/16/19 0729  TempSrc:   PainSc: 0-No pain                 Molli Barrows

## 2020-05-02 ENCOUNTER — Other Ambulatory Visit: Payer: Self-pay | Admitting: Internal Medicine

## 2020-05-03 ENCOUNTER — Encounter: Payer: Self-pay | Admitting: Internal Medicine

## 2020-07-19 NOTE — Progress Notes (Signed)
Chief Complaint  Patient presents with  . Gynecologic Exam    HPI:      Ms. Patricia Garner is a 64 y.o. G2P2002 who LMP was No LMP recorded. Patient is postmenopausal., presents today for her annual examination.  Her menses are absent due to menopause.  She does not have postmenopausal bleeding. She does not have vasomotor sx.   Sex activity: single partner, contraception - post menopausal status. She does have vaginal dryness. She uses lubricants with relief.   Last Pap: 07/02/19  Results were: no abnormalities /neg HPV DNA.   She has a hx of chronic vaginal irritation. Dr. Ammie Dalton gave her mycolog crm that helped initially, but sx persisted. She sweats a lot vaginally. Rx changed to lotrisone crm 4/18 with sx improvement. Doesn't need RF today. Sx flare with sweating. Was out in yard yesterday and in damp underwear. Sx worse today. Doesn't look like LS in past on exam.   Last mammogram: 08/18/19  Results were: normal--routine follow-up in 12 months There is no FH of breast cancer. There is no FH of ovarian cancer. The patient does not do self-breast exams.  Colonoscopy: colonoscopy 2017; Repeat after 5 yrs per pt report.  Tobacco use: The patient denies current or previous tobacco use. Alcohol use: none  No drug use Exercise: not active  She does get adequate calcium and Vitamin D in her diet. Labs with PCP. Has hyperlipidemia and Vit D deficiency.    Past Medical History:  Diagnosis Date  . Allergy   . Arthritis   . Asthma   . Barrett esophagus   . Chicken pox   . Chronic vaginitis   . GERD (gastroesophageal reflux disease)   . History of colon polyps   . Hyperlipidemia   . Hypertension   . Osteoporosis   . Vitamin D deficiency     Past Surgical History:  Procedure Laterality Date  . CHOLECYSTECTOMY  1983  . COLONOSCOPY WITH PROPOFOL N/A 08/28/2016   Procedure: COLONOSCOPY WITH PROPOFOL;  Surgeon: Lollie Sails, MD;  Location: [facility] ENDOSCOPY;  Service:  Endoscopy;  Laterality: N/A;  . ESOPHAGOGASTRODUODENOSCOPY (EGD) WITH PROPOFOL N/A 08/28/2016   Procedure: ESOPHAGOGASTRODUODENOSCOPY (EGD) WITH PROPOFOL;  Surgeon: Lollie Sails, MD;  Location: [facility] ENDOSCOPY;  Service: Endoscopy;  Laterality: N/A;  . ESOPHAGOGASTRODUODENOSCOPY (EGD) WITH PROPOFOL N/A 09/15/2019   Procedure: ESOPHAGOGASTRODUODENOSCOPY (EGD) WITH PROPOFOL;  Surgeon: Lollie Sails, MD;  Location: [facility] ENDOSCOPY;  Service: Endoscopy;  Laterality: N/A;  . TONSILLECTOMY  1969    Family History  Problem Relation Age of Onset  . Arthritis Mother   . Heart disease Mother   . Stroke Mother   . Hypertension Mother   . Alcohol abuse Father   . Alcohol abuse Sister   . Mental illness Sister   . Stomach cancer Sister 43  . Alcohol abuse Brother   . Drug abuse Brother   . Arthritis Maternal Grandmother   . Stroke Maternal Grandmother   . Colon cancer Maternal Grandfather 4    Social History   Socioeconomic History  . Marital status: Married    Spouse name: Not on file  . Number of children: Not on file  . Years of education: Not on file  . Highest education level: Not on file  Occupational History  . Not on file  Tobacco Use  . Smoking status: Never Smoker  . Smokeless tobacco: Never Used  Vaping Use  . Vaping Use: Never used  Substance and Sexual  Activity  . Alcohol use: No    Alcohol/week: 0.0 standard drinks  . Drug use: Never  . Sexual activity: Yes    Birth control/protection: Post-menopausal  Other Topics Concern  . Not on file  Social History Narrative  . Not on file   Social Determinants of Health   Financial Resource Strain:   . Difficulty of Paying Living Expenses:   Food Insecurity:   . Worried About Charity fundraiser in the Last Year:   . Arboriculturist in the Last Year:   Transportation Needs:   . Film/video editor (Medical):   Marland Kitchen Lack of Transportation (Non-Medical):   Physical Activity:   . Days of Exercise per Week:   .  Minutes of Exercise per Session:   Stress:   . Feeling of Stress :   Social Connections:   . Frequency of Communication with Friends and Family:   . Frequency of Social Gatherings with Friends and Family:   . Attends Religious Services:   . Active Member of Clubs or Organizations:   . Attends Archivist Meetings:   Marland Kitchen Marital Status:   Intimate Partner Violence:   . Fear of Current or Ex-Partner:   . Emotionally Abused:   Marland Kitchen Physically Abused:   . Sexually Abused:      Current Outpatient Medications:  .  albuterol (PROVENTIL HFA) 108 (90 Base) MCG/ACT inhaler, INHALE ONE TO TWO PUFFS BY MOUTH EVERY 6 HOURS AS NEEDED FOR WHEEZING OR SHORTNESS OF BREATH, Disp: 1 Inhaler, Rfl: 5 .  Calcium-Vitamin D 600-200 MG-UNIT tablet, Take by mouth., Disp: , Rfl:  .  clotrimazole-betamethasone (LOTRISONE) cream, Apply externally BID for 2 wks, then BID prn sx, Disp: 45 g, Rfl: 0 .  ferrous sulfate 325 (65 FE) MG EC tablet, Take by mouth., Disp: , Rfl:  .  lisinopril-hydrochlorothiazide (ZESTORETIC) 20-25 MG tablet, Take 1 tablet by mouth once daily, Disp: 90 tablet, Rfl: 0 .  Multiple Vitamins-Minerals (MULTIVITAMIN WITH MINERALS) tablet, Take by mouth., Disp: , Rfl:  .  pantoprazole (PROTONIX) 40 MG tablet, Take 1 tablet (40 mg total) by mouth daily., Disp: 90 tablet, Rfl: 2   ROS:  Review of Systems  Constitutional: Negative for fever, malaise/fatigue and weight loss.  HENT: Negative for congestion, ear pain and sinus pain.   Respiratory: Negative for cough, shortness of breath and wheezing.   Cardiovascular: Negative for chest pain, orthopnea and leg swelling.  Gastrointestinal: Negative for constipation, diarrhea, nausea and vomiting.  Genitourinary: Negative for dysuria, frequency, hematuria and urgency.       Breast ROS: negative   Musculoskeletal: Negative for back pain, joint pain and myalgias.  Skin: Negative for itching and rash.  Neurological: Negative for dizziness,  tingling, focal weakness and headaches.  Endo/Heme/Allergies: Negative for environmental allergies. Does not bruise/bleed easily.  Psychiatric/Behavioral: Negative for depression and suicidal ideas. The patient is not nervous/anxious and does not have insomnia.     Objective: BP 120/70   Ht 5\' 3"  (1.6 m)   Wt 274 lb (124.3 kg)   BMI 48.54 kg/m    Physical Exam Constitutional:      Appearance: She is well-developed.  Genitourinary:     Vulva, vagina, cervix, uterus, right adnexa and left adnexa normal.     No vulval lesion or tenderness noted.     No vaginal discharge, erythema or tenderness.     No cervical polyp.     Uterus is not enlarged or tender.  No right or left adnexal mass present.     Right adnexa not tender.     Left adnexa not tender.     Genitourinary Comments: EXT EXAM WITH ERYTHEMA IN PATCHES BILAT LABIA MAJORA WITH A LITTLE SKIN BREAKDOWN  Neck:     Thyroid: No thyromegaly.  Cardiovascular:     Rate and Rhythm: Normal rate and regular rhythm.     Heart sounds: Normal heart sounds. No murmur heard.   Pulmonary:     Effort: Pulmonary effort is normal.     Breath sounds: Normal breath sounds.  Chest:     Breasts:        Right: No mass, nipple discharge, skin change or tenderness.        Left: No mass, nipple discharge, skin change or tenderness.  Abdominal:     Palpations: Abdomen is soft.     Tenderness: There is no abdominal tenderness. There is no guarding.  Musculoskeletal:        General: Normal range of motion.     Cervical back: Normal range of motion.  Neurological:     General: No focal deficit present.     Mental Status: She is alert and oriented to person, place, and time.     Cranial Nerves: No cranial nerve deficit.  Skin:    General: Skin is warm and dry.  Psychiatric:        Mood and Affect: Mood normal.        Behavior: Behavior normal.        Thought Content: Thought content normal.        Judgment: Judgment normal.  Vitals  reviewed.      Assessment/Plan:  Encounter for annual routine gynecological examination -   Screening for breast cancer - Plan: MM 3D SCREEN BREAST BILATERAL,Pt to sched mammo  Chronic vaginitis -  Keep area dry. Try A&D oint/Desitin for moisture barrier. Rx RF lotrisone crm prn. F/u prn.           GYN counsel mammography screening, adequate intake of calcium and vitamin D, diet and exercise     F/U  Return in about 1 year (around 07/20/2021).  Jaydien Panepinto B. Retal Tonkinson, PA-C 07/20/2020 8:51 AM

## 2020-07-20 ENCOUNTER — Ambulatory Visit (INDEPENDENT_AMBULATORY_CARE_PROVIDER_SITE_OTHER): Payer: Medicaid Other | Admitting: Obstetrics and Gynecology

## 2020-07-20 ENCOUNTER — Other Ambulatory Visit: Payer: Self-pay

## 2020-07-20 ENCOUNTER — Encounter: Payer: Self-pay | Admitting: Obstetrics and Gynecology

## 2020-07-20 VITALS — BP 120/70 | Ht 63.0 in | Wt 274.0 lb

## 2020-07-20 DIAGNOSIS — Z1231 Encounter for screening mammogram for malignant neoplasm of breast: Secondary | ICD-10-CM

## 2020-07-20 DIAGNOSIS — Z01419 Encounter for gynecological examination (general) (routine) without abnormal findings: Secondary | ICD-10-CM

## 2020-07-20 DIAGNOSIS — N761 Subacute and chronic vaginitis: Secondary | ICD-10-CM

## 2020-07-20 NOTE — Patient Instructions (Signed)
I value your feedback and entrusting us with your care. If you get a [place] patient survey, I would appreciate you taking the time to let us know about your experience today. Thank you! ° °As of December 10, 2019, your lab results will be released to your MyChart immediately, before I even have a chance to see them. Please give me time to review them and contact you if there are any abnormalities. Thank you for your patience.  ° °[facility]: [phone] ° ° ° °

## 2020-07-25 ENCOUNTER — Encounter: Payer: 59 | Admitting: Internal Medicine

## 2020-08-14 ENCOUNTER — Other Ambulatory Visit: Payer: Self-pay | Admitting: Internal Medicine

## 2020-08-31 ENCOUNTER — Encounter: Payer: Medicaid Other | Admitting: Internal Medicine

## 2020-09-01 ENCOUNTER — Encounter: Payer: Self-pay | Admitting: Internal Medicine

## 2020-09-01 ENCOUNTER — Other Ambulatory Visit: Payer: Self-pay

## 2020-09-01 ENCOUNTER — Ambulatory Visit: Payer: Self-pay | Admitting: Internal Medicine

## 2020-09-01 VITALS — BP 124/76 | HR 90 | Temp 97.2°F | Wt 274.0 lb

## 2020-09-01 DIAGNOSIS — J4530 Mild persistent asthma, uncomplicated: Secondary | ICD-10-CM

## 2020-09-01 DIAGNOSIS — M170 Bilateral primary osteoarthritis of knee: Secondary | ICD-10-CM

## 2020-09-01 DIAGNOSIS — I10 Essential (primary) hypertension: Secondary | ICD-10-CM

## 2020-09-01 DIAGNOSIS — K2270 Barrett's esophagus without dysplasia: Secondary | ICD-10-CM

## 2020-09-01 DIAGNOSIS — M816 Localized osteoporosis [Lequesne]: Secondary | ICD-10-CM

## 2020-09-01 DIAGNOSIS — Z0000 Encounter for general adult medical examination without abnormal findings: Secondary | ICD-10-CM

## 2020-09-01 NOTE — Assessment & Plan Note (Signed)
Continue Moringa, Tylenol Arthritis and Ibuprofen Encouraged daily exercise and weight loss

## 2020-09-01 NOTE — Assessment & Plan Note (Signed)
Continue Pantoprazole CBC and C met today

## 2020-09-01 NOTE — Patient Instructions (Signed)

## 2020-09-01 NOTE — Assessment & Plan Note (Signed)
Controlled on Lisinopril HCT C met today Reinforced DASH diet

## 2020-09-01 NOTE — Progress Notes (Signed)
Subjective:    Patient ID: Patricia Garner, female    DOB: 1956/05/13, 64 y.o.   MRN: [id number]  HPI  Patient presents the clinic today for her annual exam.  She is also due to follow-up chronic conditions.  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  She takes Moringa, Tylenol Arthritis or Ibuprofen as needed with good relief of symptoms.  Asthma: Mild, persistent.  Controlled with Albuterol as needed.  There are no PFTs on file.  She is not following pulmonology.  GERD: With Barrett's esophagus.  She denies difficulty swallowing.  She denies breakthrough on Pantoprazole. She will take an iron supplement intermittently.  Upper GI from 09/2019 reviewed.  HTN: Her BP today is 124/76.  She is taking Lisinopril HCT as prescribed. She reports occasional muscle cramps.  There is no ECG on file.  Osteoporosis: She is taking Calcium and Vitamin D as prescribed.  She does not get weightbearing exercise daily.  Bone density from 02/2008 reviewed.  Flu: never Tetanus: about 10 years ago Shingrix: never Covid: never Pap smear: 07/2019 Mammogram: 08/2019, ordered Bone density: Greater than 5 years ago Colon screening: 07/2016, 5 years Vision screening: annually Dentist: annually  Diet: She does eat lean meat. She consumes more veggies than fruits due to allergies. She tries to avoid fried foods. She drinks mostly water, Kogler tea. Exercise: None  Review of Systems      Past Medical History:  Diagnosis Date  . Allergy   . Arthritis   . Asthma   . Barrett esophagus   . Chicken pox   . Chronic vaginitis   . GERD (gastroesophageal reflux disease)   . History of colon polyps   . Hyperlipidemia   . Hypertension   . Osteoporosis   . Vitamin D deficiency     Current Outpatient Medications  Medication Sig Dispense Refill  . albuterol (PROVENTIL HFA) 108 (90 Base) MCG/ACT inhaler INHALE ONE TO TWO PUFFS BY MOUTH EVERY 6 HOURS AS NEEDED FOR WHEEZING OR SHORTNESS OF BREATH 1 Inhaler 5  . Calcium-Vitamin D  600-200 MG-UNIT tablet Take by mouth.    . clotrimazole-betamethasone (LOTRISONE) cream Apply externally BID for 2 wks, then BID prn sx 45 g 0  . ferrous sulfate 325 (65 FE) MG EC tablet Take by mouth.    Marland Kitchen lisinopril-hydrochlorothiazide (ZESTORETIC) 20-25 MG tablet Take 1 tablet by mouth once daily 90 tablet 0  . Multiple Vitamins-Minerals (MULTIVITAMIN WITH MINERALS) tablet Take by mouth.    . pantoprazole (PROTONIX) 40 MG tablet Take 1 tablet (40 mg total) by mouth daily. 90 tablet 2   No current facility-administered medications for this visit.    Allergies  Allergen Reactions  . Bactrim [Sulfamethoxazole-Trimethoprim] Nausea Only  . Codeine Nausea And Vomiting  . Hydrocodone Hives  . Lipitor [Atorvastatin] Other (See Comments)    Myalgia   . Medrol [Methylprednisolone] Nausea Only    Family History  Problem Relation Age of Onset  . Arthritis Mother   . Heart disease Mother   . Stroke Mother   . Hypertension Mother   . Alcohol abuse Father   . Alcohol abuse Sister   . Mental illness Sister   . Stomach cancer Sister 25  . Alcohol abuse Brother   . Drug abuse Brother   . Arthritis Maternal Grandmother   . Stroke Maternal Grandmother   . Colon cancer Maternal Grandfather 28    Social History   Socioeconomic History  . Marital status: Married    Spouse name:  Not on file  . Number of children: Not on file  . Years of education: Not on file  . Highest education level: Not on file  Occupational History  . Not on file  Tobacco Use  . Smoking status: Never Smoker  . Smokeless tobacco: Never Used  Vaping Use  . Vaping Use: Never used  Substance and Sexual Activity  . Alcohol use: No    Alcohol/week: 0.0 standard drinks  . Drug use: Never  . Sexual activity: Yes    Birth control/protection: Post-menopausal  Other Topics Concern  . Not on file  Social History Narrative  . Not on file   Social Determinants of Health   Financial Resource Strain:   . Difficulty  of Paying Living Expenses: Not on file  Food Insecurity:   . Worried About Charity fundraiser in the Last Year: Not on file  . Ran Out of Food in the Last Year: Not on file  Transportation Needs:   . Lack of Transportation (Medical): Not on file  . Lack of Transportation (Non-Medical): Not on file  Physical Activity:   . Days of Exercise per Week: Not on file  . Minutes of Exercise per Session: Not on file  Stress:   . Feeling of Stress : Not on file  Social Connections:   . Frequency of Communication with Friends and Family: Not on file  . Frequency of Social Gatherings with Friends and Family: Not on file  . Attends Religious Services: Not on file  . Active Member of Clubs or Organizations: Not on file  . Attends Archivist Meetings: Not on file  . Marital Status: Not on file  Intimate Partner Violence:   . Fear of Current or Ex-Partner: Not on file  . Emotionally Abused: Not on file  . Physically Abused: Not on file  . Sexually Abused: Not on file     Constitutional: Denies fever, malaise, fatigue, headache or abrupt weight changes.  HEENT: Denies eye pain, eye redness, ear pain, ringing in the ears, wax buildup, runny nose, nasal congestion, bloody nose, or sore throat. Respiratory: Denies difficulty breathing, shortness of breath, cough or sputum production.   Cardiovascular: Denies chest pain, chest tightness, palpitations or swelling in the hands or feet.  Gastrointestinal: Denies abdominal pain, bloating, constipation, diarrhea or blood in the stool.  GU: Denies urgency, frequency, pain with urination, burning sensation, blood in urine, odor or discharge. Musculoskeletal: Pt reports intermittent joint pain, muscle cramps in legs. Denies decrease in range of motion, difficulty with gait, or joint swelling.  Skin: Denies redness, rashes, lesions or ulcercations.  Neurological: Denies dizziness, difficulty with memory, difficulty with speech or problems with balance  and coordination.  Psych: Denies anxiety, depression, SI/HI.  No other specific complaints in a complete review of systems (except as listed in HPI above).  Objective:   Physical Exam BP 124/76   Pulse 90   Temp (!) 97.2 F (36.2 C) (Temporal)   Wt 274 lb (124.3 kg)   SpO2 98%   BMI 48.54 kg/m   Wt Readings from Last 3 Encounters:  07/20/20 274 lb (124.3 kg)  09/15/19 270 lb (122.5 kg)  07/02/19 278 lb 9.6 oz (126.4 kg)    General: Appears her stated age, obese, in NAD. Skin: Warm, dry and intact. No rashes noted. HEENT: Head: normal shape and size; Eyes: sclera white, no icterus, conjunctiva pink, PERRLA and EOMs intact;  Neck:  Neck supple, trachea midline. No masses, lumps or  thyromegaly present.  Cardiovascular: Normal rate and rhythm. S1,S2 noted.  No murmur, rubs or gallops noted. No JVD or BLE edema. No carotid bruits noted. Pulmonary/Chest: Normal effort and positive vesicular breath sounds. No respiratory distress. No wheezes, rales or ronchi noted.  Abdomen: Soft and nontender. Normal bowel sounds. No distention or masses noted. Liver, spleen and kidneys non palpable. Musculoskeletal: Strength 5/5 BUE/BLE. No difficulty with gait.  Neurological: Alert and oriented. Cranial nerves II-XII grossly intact. Coordination normal.  Psychiatric: Mood and affect normal. Behavior is normal. Judgment and thought content normal.     BMET    Component Value Date/Time   NA 138 04/24/2019 0833   K 4.5 04/24/2019 0833   CL 99 04/24/2019 0833   CO2 30 04/24/2019 0833   GLUCOSE 109 (H) 04/24/2019 0833   BUN 18 04/24/2019 0833   CREATININE 0.63 04/24/2019 0833   CALCIUM 9.9 04/24/2019 0833    Lipid Panel     Component Value Date/Time   CHOL 242 (H) 04/24/2019 0833   TRIG 237.0 (H) 04/24/2019 0833   HDL 40.80 04/24/2019 0833   CHOLHDL 6 04/24/2019 0833   VLDL 47.4 (H) 04/24/2019 0833   LDLCALC 168 (H) 07/11/2017 0848    CBC    Component Value Date/Time   WBC 6.9  04/24/2019 0833   RBC 4.02 04/24/2019 0833   HGB 12.3 04/24/2019 0833   HCT 36.1 04/24/2019 0833   PLT 322.0 04/24/2019 0833   MCV 89.7 04/24/2019 0833   MCHC 34.1 04/24/2019 0833   RDW 13.4 04/24/2019 0833    Hgb A1C Lab Results  Component Value Date   HGBA1C 6.3 04/24/2019            Assessment & Plan:   Preventative Health Maintenance:  She declines flu shot today She declines tetanus vaccine Encouraged her to get a Covid vaccine Encouraged her to call her insurance about a Shingrix vaccine advised her if she wants to get this she will need to make a nurse visit Pap smear UTD Mammogram due, she will call to schedule She declines bone density at this time Colon screening UTD Encouraged her to consume a balanced diet and exercise regimen Advised her to see an eye doctor and dentist annually We will check CBC, C met, lipid, A1c and vitamin D today  RTC in 1 year, sooner if needed Webb Silversmith, NP This visit occurred during the SARS-CoV-2 public health emergency.  Safety protocols were in place, including screening questions prior to the visit, additional usage of staff PPE, and extensive cleaning of exam room while observing appropriate contact time as indicated for disinfecting solutions.

## 2020-09-01 NOTE — Assessment & Plan Note (Signed)
Continue Albuterol as needed ?We will monitor ?

## 2020-09-01 NOTE — Assessment & Plan Note (Signed)
Continue Calcium and Vitamin D Encouraged daily weightbearing exercise 

## 2020-09-02 ENCOUNTER — Encounter: Payer: Self-pay | Admitting: Internal Medicine

## 2020-09-02 LAB — LIPID PANEL
Cholesterol: 250 mg/dL — ABNORMAL HIGH (ref 0–200)
HDL: 46.6 mg/dL (ref >39.00)
LDL Cholesterol: 173 mg/dL — ABNORMAL HIGH (ref 0–99)
NonHDL: 203.73
Total CHOL/HDL Ratio: 5
Triglycerides: 153 mg/dL — ABNORMAL HIGH (ref 0.0–149.0)
VLDL: 30.6 mg/dL (ref 0.0–40.0)

## 2020-09-02 LAB — COMPREHENSIVE METABOLIC PANEL
ALT: 28 U/L (ref 0–35)
AST: 23 U/L (ref 0–37)
Albumin: 4.6 g/dL (ref 3.5–5.2)
Alkaline Phosphatase: 64 U/L (ref 39–117)
BUN: 21 mg/dL (ref 6–23)
CO2: 25 mEq/L (ref 19–32)
Calcium: 10.3 mg/dL (ref 8.4–10.5)
Chloride: 100 mEq/L (ref 96–112)
Creatinine, Ser: 0.8 mg/dL (ref 0.40–1.20)
GFR: 72.49 mL/min (ref >60.00)
Glucose, Bld: 100 mg/dL — ABNORMAL HIGH (ref 70–99)
Potassium: 3.8 mEq/L (ref 3.5–5.1)
Sodium: 138 mEq/L (ref 135–145)
Total Bilirubin: 0.6 mg/dL (ref 0.2–1.2)
Total Protein: 7.2 g/dL (ref 6.0–8.3)

## 2020-09-02 LAB — CBC
HCT: 36.4 % (ref 36.0–46.0)
Hemoglobin: 12.1 g/dL (ref 12.0–15.0)
MCHC: 33.1 g/dL (ref 30.0–36.0)
MCV: 90.9 fl (ref 78.0–100.0)
Platelets: 338 10*3/uL (ref 150.0–400.0)
RBC: 4 Mil/uL (ref 3.87–5.11)
RDW: 13.7 % (ref 11.5–15.5)
WBC: 11.4 10*3/uL — ABNORMAL HIGH (ref 4.0–10.5)

## 2020-09-02 LAB — HEMOGLOBIN A1C: Hgb A1c MFr Bld: 6.3 % (ref 4.6–6.5)

## 2020-09-02 LAB — VITAMIN D 25 HYDROXY (VIT D DEFICIENCY, FRACTURES): VITD: 38.54 ng/mL (ref 30.00–100.00)

## 2020-09-13 NOTE — Telephone Encounter (Signed)
I attempted to reach patient to let her know that I will look into this. I left a voicemail asking her to call the office.

## 2020-09-14 NOTE — Telephone Encounter (Signed)
Patient returned call.  I let her know Emily's looking in to it and will give her a call back when she has more information.

## 2020-11-15 ENCOUNTER — Other Ambulatory Visit: Payer: Self-pay | Admitting: Internal Medicine

## 2020-12-01 ENCOUNTER — Other Ambulatory Visit: Payer: Self-pay | Admitting: Internal Medicine

## 2020-12-01 DIAGNOSIS — J4520 Mild intermittent asthma, uncomplicated: Secondary | ICD-10-CM

## 2020-12-22 IMAGING — MG DIGITAL SCREENING BILATERAL MAMMOGRAM WITH TOMO AND CAD
8 series · 8 of 24 positions shown · non-contrast
Comparison: Previous exam(s).

CLINICAL DATA: Screening.

EXAM:
DIGITAL SCREENING BILATERAL MAMMOGRAM WITH TOMO AND CAD

[L CC synth-2D]
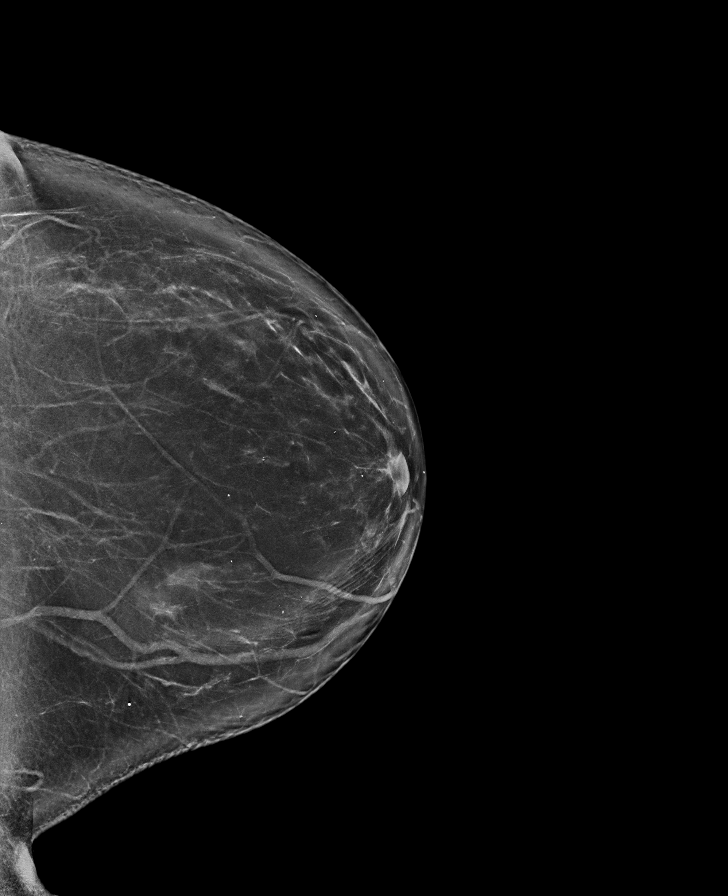

[R MLO synth-2D]
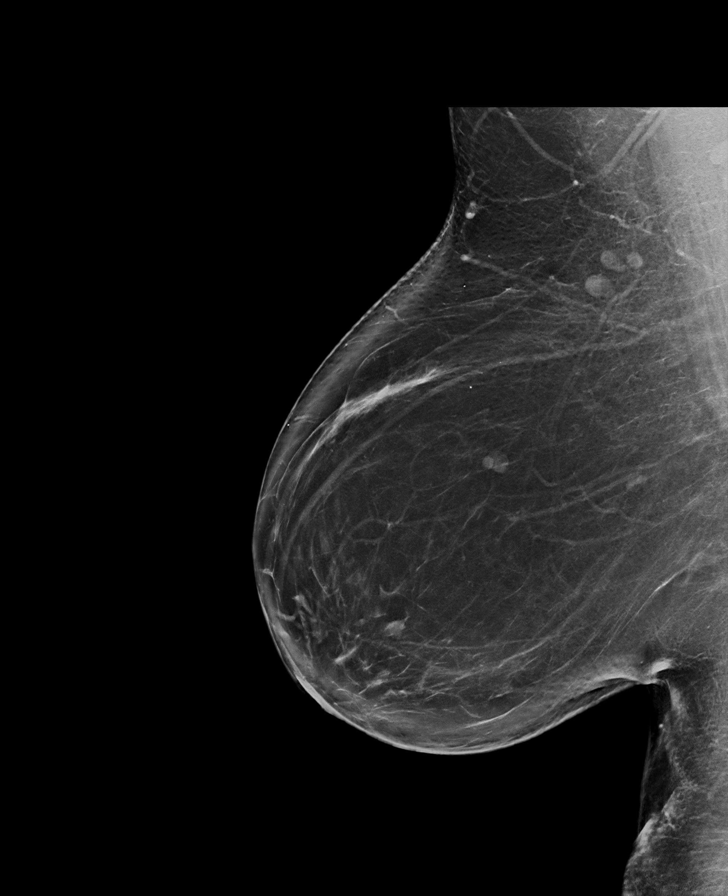

[R CC synth-2D]
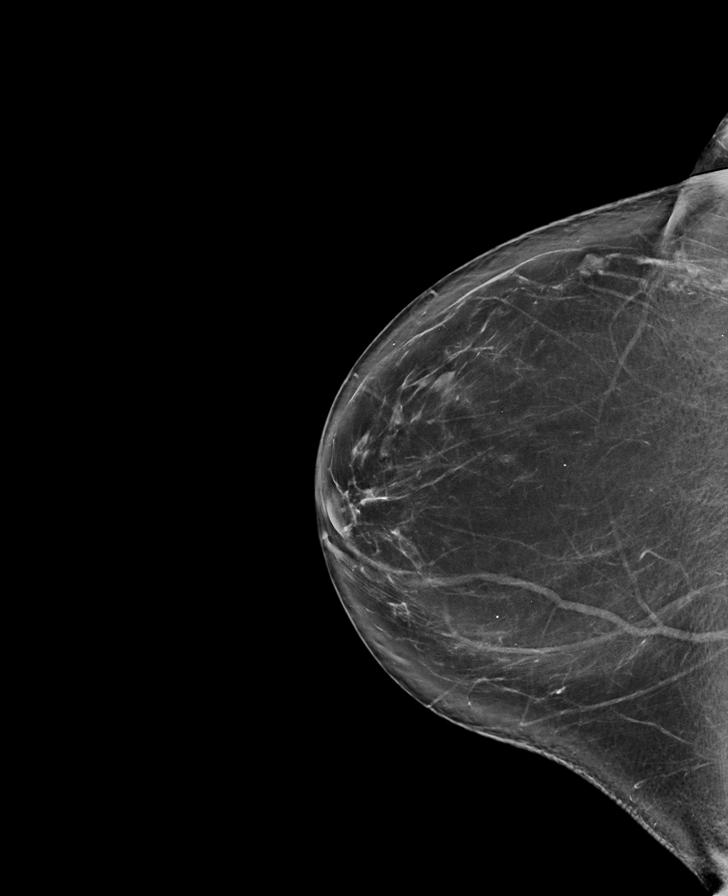

[L MLO synth-2D]
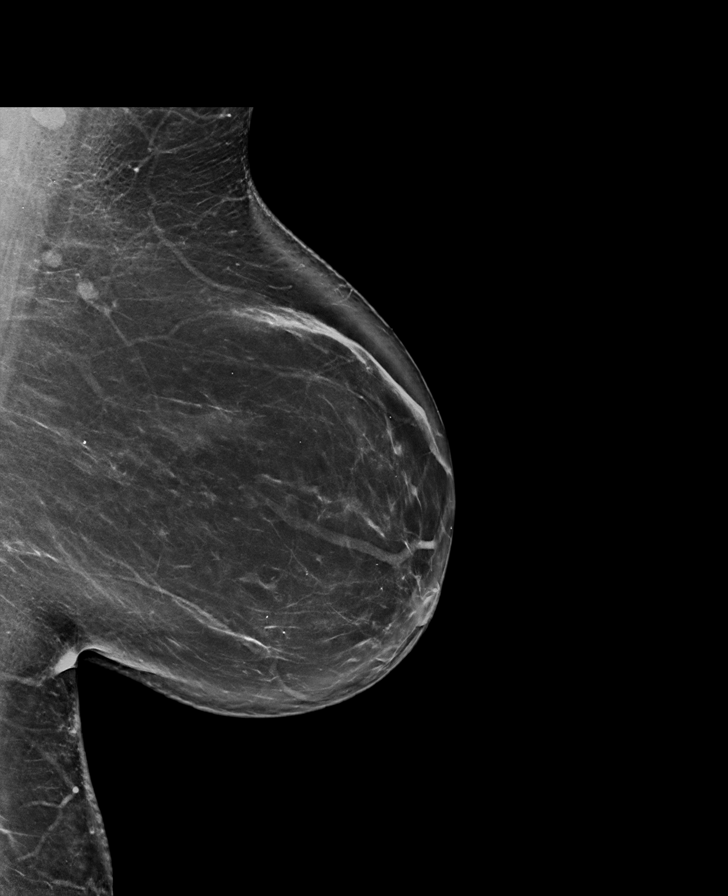

[L MLO tomo · tomo slice 48/95.0]
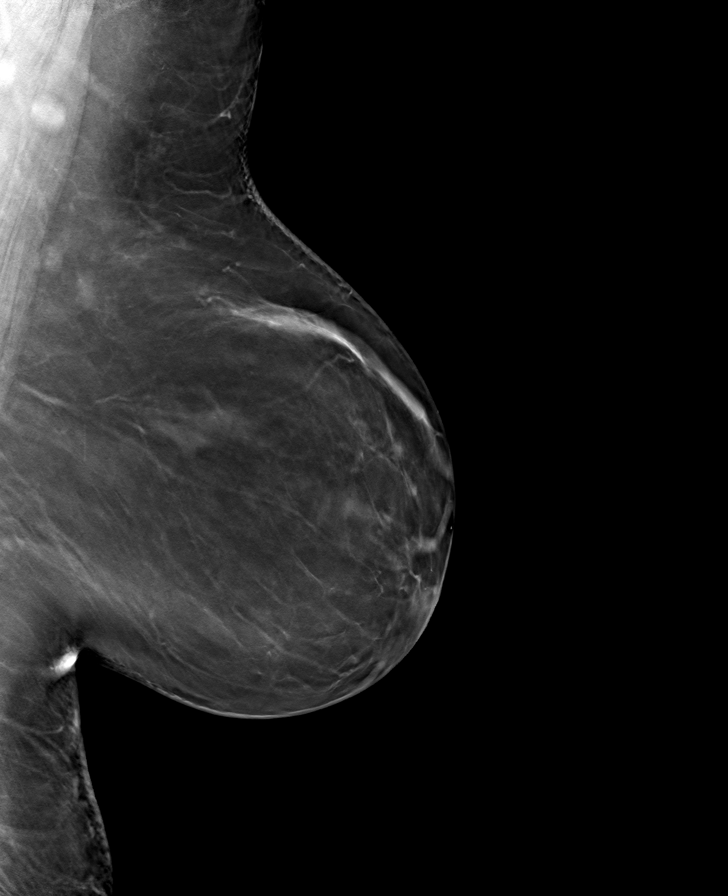

[R CC tomo · tomo slice 45/88.0]
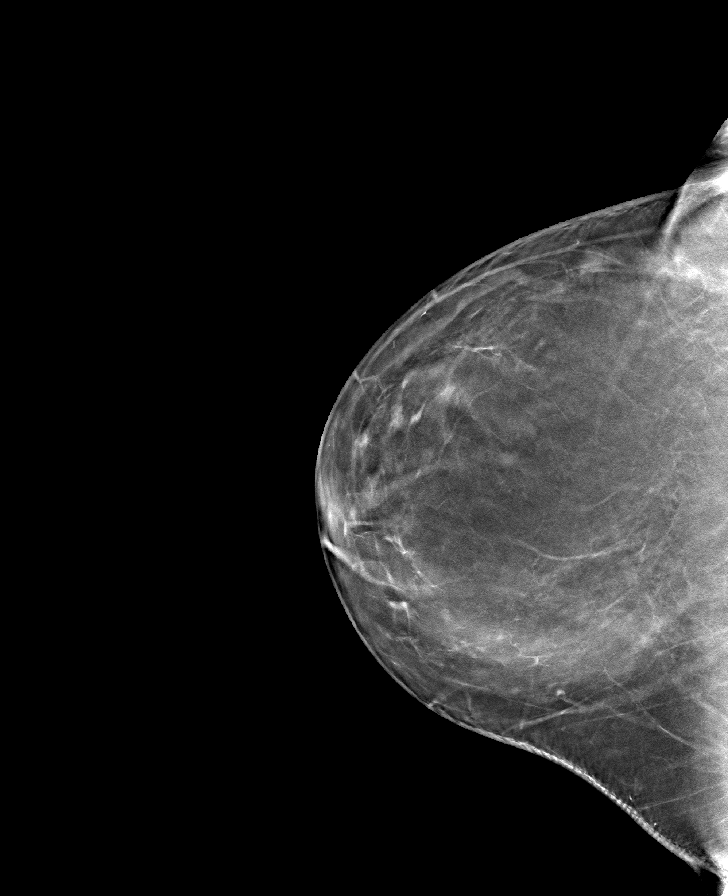

[L CC tomo · tomo slice 44/87.0]
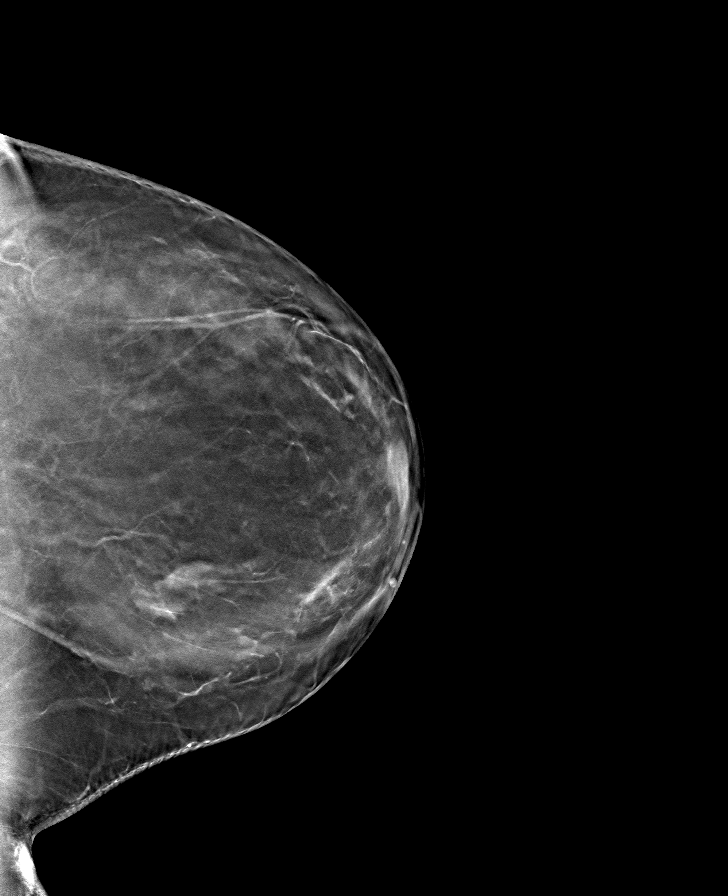

[R MLO tomo · tomo slice 49/97.0]
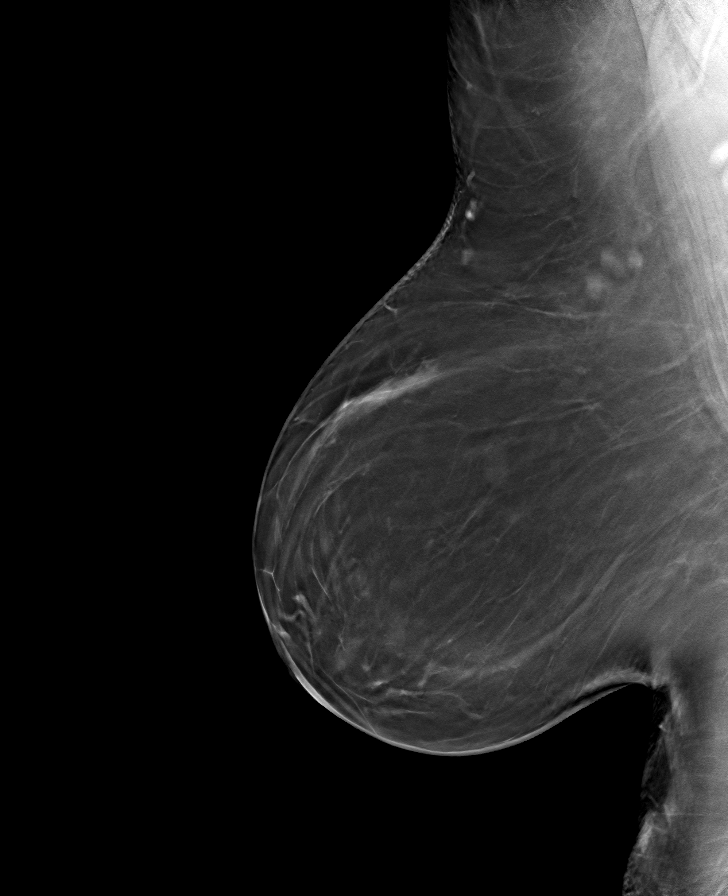

[8 of 24 positions shown; findings below may reference images not displayed]

ACR Breast Density Category b: There are scattered areas of
fibroglandular density.
FINDINGS: There are no findings suspicious for malignancy. Images were
processed with CAD.
IMPRESSION: No mammographic evidence of malignancy. A result letter of this
screening mammogram will be mailed directly to the patient.

RECOMMENDATION:
Screening mammogram in one year. (Code:[id number])

BI-RADS CATEGORY  1: Negative.

## 2021-05-03 ENCOUNTER — Other Ambulatory Visit: Payer: Self-pay | Admitting: Obstetrics and Gynecology

## 2021-05-03 DIAGNOSIS — N761 Subacute and chronic vaginitis: Secondary | ICD-10-CM

## 2021-05-15 ENCOUNTER — Other Ambulatory Visit: Payer: Self-pay | Admitting: Internal Medicine

## 2021-05-15 DIAGNOSIS — I10 Essential (primary) hypertension: Secondary | ICD-10-CM

## 2021-05-25 NOTE — Telephone Encounter (Signed)
Patient called stating that she is on her last tablet of blood pressure medication and she called the pharmacy and was advised no one has responded to this request. I looked in the chart and refill was sent over on 05/15/21 but has not been reviewed.  Sending to [facility] pool. Patient is going to stay with [place] and is aware we have 2 NPs starting this summer to who she will be assigned to. Please refill today. Thank you  LOV with Webb Silversmith on 09/20/20 for CPE. No future appointments at this time. Lisinopril-HCTZ last filled on 11/16/20 # 90 with 1 refill [address]  Also sending to Randall An RN to be aware of the delay.

## 2021-05-25 NOTE — Telephone Encounter (Signed)
Refill sent to pharmacy.   

## 2021-08-17 DIAGNOSIS — M25562 Pain in left knee: Secondary | ICD-10-CM | POA: Insufficient documentation

## 2021-08-21 ENCOUNTER — Other Ambulatory Visit: Payer: Self-pay | Admitting: Primary Care

## 2021-08-21 DIAGNOSIS — I10 Essential (primary) hypertension: Secondary | ICD-10-CM

## 2021-10-01 ENCOUNTER — Other Ambulatory Visit: Payer: Self-pay | Admitting: Primary Care

## 2021-10-01 DIAGNOSIS — I10 Essential (primary) hypertension: Secondary | ICD-10-CM

## 2021-10-30 ENCOUNTER — Other Ambulatory Visit: Payer: Self-pay | Admitting: Primary Care

## 2021-10-30 DIAGNOSIS — I10 Essential (primary) hypertension: Secondary | ICD-10-CM

## 2021-10-30 NOTE — Telephone Encounter (Signed)
LMTCB to schedule a TOC appt with [facility]

## 2021-10-30 NOTE — Telephone Encounter (Signed)
Please call patient Needs TOC app with a provider

## 2021-10-31 NOTE — Telephone Encounter (Signed)
2nd attempt  LMTCB to schedule a TOC

## 2021-11-01 NOTE — Telephone Encounter (Signed)
Pt called back she stated that she is going to stay with Baity at [facility]

## 2021-11-02 ENCOUNTER — Telehealth: Payer: Self-pay

## 2021-11-02 DIAGNOSIS — I10 Essential (primary) hypertension: Secondary | ICD-10-CM

## 2021-11-02 MED ORDER — LISINOPRIL-HYDROCHLOROTHIAZIDE 20-25 MG PO TABS: 1.0000 | ORAL_TABLET | Freq: Every day | ORAL | 0 refills | Status: DC

## 2021-11-02 NOTE — Telephone Encounter (Signed)
Copied from [place] [phone]. Topic: General - Other >> Nov 01, 2021  3:31 PM Valere Dross wrote: Reason for CRM: Pt called in wanting to get a refill on her medication since she is completely out of her lisinopril-hydrochlorothiazide (ZESTORETIC) 20-25 MG tablet and wanted to get a sooner appt that was available due to her only having 3 days left, and unable to come in on the days that were available, pt requested if someone could give her a call to get squeezed in sooner on Thursday morning, please advise.

## 2021-11-02 NOTE — Telephone Encounter (Signed)
I called the patient and left a message on her vm to give the office a call and schedule an appt at her convenience in order to continue to get refills on her medication.

## 2021-11-09 ENCOUNTER — Ambulatory Visit (INDEPENDENT_AMBULATORY_CARE_PROVIDER_SITE_OTHER): Payer: Self-pay | Admitting: Internal Medicine

## 2021-11-09 ENCOUNTER — Encounter: Payer: Self-pay | Admitting: Internal Medicine

## 2021-11-09 ENCOUNTER — Other Ambulatory Visit: Payer: Self-pay

## 2021-11-09 VITALS — BP 137/82 | HR 80 | Temp 97.3°F | Resp 17 | Ht 63.0 in | Wt 271.2 lb

## 2021-11-09 DIAGNOSIS — R7303 Prediabetes: Secondary | ICD-10-CM

## 2021-11-09 DIAGNOSIS — E782 Mixed hyperlipidemia: Secondary | ICD-10-CM

## 2021-11-09 DIAGNOSIS — K219 Gastro-esophageal reflux disease without esophagitis: Secondary | ICD-10-CM

## 2021-11-09 DIAGNOSIS — J4520 Mild intermittent asthma, uncomplicated: Secondary | ICD-10-CM

## 2021-11-09 DIAGNOSIS — M816 Localized osteoporosis [Lequesne]: Secondary | ICD-10-CM

## 2021-11-09 DIAGNOSIS — M170 Bilateral primary osteoarthritis of knee: Secondary | ICD-10-CM

## 2021-11-09 DIAGNOSIS — I10 Essential (primary) hypertension: Secondary | ICD-10-CM

## 2021-11-09 DIAGNOSIS — E611 Iron deficiency: Secondary | ICD-10-CM

## 2021-11-09 NOTE — Assessment & Plan Note (Signed)
Continue Calcium and Vitamin D Encourage daily weightbearing exercise

## 2021-11-09 NOTE — Assessment & Plan Note (Signed)
Encourage diet and exercise for weight loss 

## 2021-11-09 NOTE — Assessment & Plan Note (Signed)
Encouraged weight loss as this can help reduce joint pain Continue Tylenol arthritis as needed She will continue to follow-up with orthopedics

## 2021-11-09 NOTE — Assessment & Plan Note (Signed)
C-Met and lipid profile today Encouraged her to consume a low-fat diet Will discuss statin therapy if LDL greater than 100

## 2021-11-09 NOTE — Assessment & Plan Note (Signed)
Avoid foods that trigger reflux Encourage weight loss as this can help reduce reflux symptoms Continue Pantoprazole as needed

## 2021-11-09 NOTE — Progress Notes (Signed)
Subjective:    Patient ID: Patricia Garner, female    DOB: 04-04-1956, 65 y.o.   MRN: [id number]  HPI  Patient presents the clinic today for follow-up of chronic conditions.  GERD with Barrett's Esophagus: Triggered by spicy foods and eating late at night.  She takes Pantoprazole only as needed.  Upper GI from 09/2019 reviewed.  HTN: Her BP today is 137/82.  She is taking Lisinopril- HCT as prescribed.  There is no ECG on file.  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  She takes Tylenol Arthritis as needed with some relief of symptoms.  She follows with orthopedics.  Osteoprosis: She is taking Calcium and Vit D OTC.  She tries to get weightbearing exercise daily.  Bone density from 02/2008 reviewed.  Asthma: She denies chronic cough or shortness of breath.  She takes Albuterol only as needed.  There are no PFTs on file.  Iron Defeciency Anemia: Her last H/H was 12.1/36.4, 08/2020.  She takes oral iron only when she feels tired or weak.  She does not follow with hematology.  HLD: Her last LDL was 173, triglycerides 153, 08/2020.  She is not taking any cholesterol-lowering medication at this time.  She does not consume a low-fat diet.  Prediabetes: Her last A1c was 6.3%, 08/2020.  She is not taking any oral diabetic medication at this time.  She does not check her sugars.  Review of Systems     Past Medical History:  Diagnosis Date   Allergy    Arthritis    Asthma    Barrett esophagus    Chicken pox    Chronic vaginitis    GERD (gastroesophageal reflux disease)    History of colon polyps    Hyperlipidemia    Hypertension    Osteoporosis    Vitamin D deficiency     Current Outpatient Medications  Medication Sig Dispense Refill   albuterol (VENTOLIN HFA) 108 (90 Base) MCG/ACT inhaler INHALE 1 TO 2 PUFFS BY MOUTH EVERY 6 HOURS AS NEEDED FOR WHEEZING OR SHORTNESS OF BREATH 8 g 5   Calcium-Vitamin D 600-200 MG-UNIT tablet Take by mouth.     clotrimazole-betamethasone (LOTRISONE) cream APPLY  TOPICALLY TWICE DAILY FOR TWO WEEKS. THEN USE TWICE DAILY AS NEEDED FOR SYMPTOMS 45 g 0   ferrous sulfate 325 (65 FE) MG EC tablet Take 325 mg by mouth as needed.     lisinopril-hydrochlorothiazide (ZESTORETIC) 20-25 MG tablet Take 1 tablet by mouth daily. Office visit required for further refills. 30 tablet 0   [place] 500 MG CAPS Take by mouth.     Multiple Vitamins-Minerals (MULTIVITAMIN WITH MINERALS) tablet Take by mouth.     pantoprazole (PROTONIX) 40 MG tablet Take 1 tablet (40 mg total) by mouth daily. (Patient not taking: Reported on 11/09/2021) 90 tablet 2   No current facility-administered medications for this visit.    Allergies  Allergen Reactions   Bactrim [Sulfamethoxazole-Trimethoprim] Nausea Only   Codeine Nausea And Vomiting   Hydrocodone Hives   Lipitor [Atorvastatin] Other (See Comments)    Myalgia    Medrol [Methylprednisolone] Nausea Only    Family History  Problem Relation Age of Onset   Arthritis Mother    Heart disease Mother    Stroke Mother    Hypertension Mother    Alcohol abuse Father    Alcohol abuse Sister    Mental illness Sister    Stomach cancer Sister 36   Alcohol abuse Brother    Drug abuse Brother  Arthritis Maternal Grandmother    Stroke Maternal Grandmother    Colon cancer Maternal Grandfather 24    Social History   Socioeconomic History   Marital status: Married    Spouse name: Not on file   Number of children: Not on file   Years of education: Not on file   Highest education level: Not on file  Occupational History   Not on file  Tobacco Use   Smoking status: Never   Smokeless tobacco: Never  Vaping Use   Vaping Use: Never used  Substance and Sexual Activity   Alcohol use: No    Alcohol/week: 0.0 standard drinks   Drug use: Never   Sexual activity: Yes    Birth control/protection: Post-menopausal  Other Topics Concern   Not on file  Social History Narrative   Not on file   Social Determinants of Health    Financial Resource Strain: Not on file  Food Insecurity: Not on file  Transportation Needs: Not on file  Physical Activity: Not on file  Stress: Not on file  Social Connections: Not on file  Intimate Partner Violence: Not on file     Constitutional: Denies fever, malaise, fatigue, headache or abrupt weight changes.  HEENT: Denies eye pain, eye redness, ear pain, ringing in the ears, wax buildup, runny nose, nasal congestion, bloody nose, or sore throat. Respiratory: Denies difficulty breathing, shortness of breath, cough or sputum production.   Cardiovascular: Denies chest pain, chest tightness, palpitations or swelling in the hands or feet.  Gastrointestinal: Denies abdominal pain, bloating, constipation, diarrhea or blood in the stool.  GU: Denies urgency, frequency, pain with urination, burning sensation, blood in urine, odor or discharge. Musculoskeletal: Denies decrease in range of motion, difficulty with gait, muscle pain or joint pain and swelling.  Skin: Denies redness, rashes, lesions or ulcercations.  Neurological: Denies dizziness, difficulty with memory, difficulty with speech or problems with balance and coordination.  Psych: Denies anxiety, depression, SI/HI.  No other specific complaints in a complete review of systems (except as listed in HPI above).  Objective:   Physical Exam  BP 137/82 (BP Location: Right Arm, Patient Position: Sitting, Cuff Size: Large)   Pulse 80   Temp (!) 97.3 F (36.3 C) (Temporal)   Resp 17   Ht 5\' 3"  (1.6 m)   Wt 271 lb 3.2 oz (123 kg)   SpO2 98%   BMI 48.04 kg/m  Wt Readings from Last 3 Encounters:  11/09/21 271 lb 3.2 oz (123 kg)  09/01/20 274 lb (124.3 kg)  07/20/20 274 lb (124.3 kg)    General: Appears their stated age, well developed, well nourished in NAD. Skin: Warm, dry and intact. No rashes, lesions or ulcerations noted. HEENT: Head: normal shape and size; Eyes: sclera white, no icterus, conjunctiva pink, PERRLA and  EOMs intact; Ears: Tm's gray and intact, normal light reflex; Nose: mucosa pink and moist, septum midline; Throat/Mouth: Teeth present, mucosa pink and moist, no exudate, lesions or ulcerations noted.  Neck:  Neck supple, trachea midline. No masses, lumps or thyromegaly present.  Cardiovascular: Normal rate and rhythm. S1,S2 noted.  No murmur, rubs or gallops noted. No JVD or BLE edema. No carotid bruits noted. Pulmonary/Chest: Normal effort and positive vesicular breath sounds. No respiratory distress. No wheezes, rales or ronchi noted.  Abdomen: Soft and nontender. Normal bowel sounds. No distention or masses noted. Liver, spleen and kidneys non palpable. Musculoskeletal: Normal range of motion. No signs of joint swelling. No difficulty with gait.  Neurological: Alert and oriented. Cranial nerves II-XII grossly intact. Coordination normal.  Psychiatric: Mood and affect normal. Behavior is normal. Judgment and thought content normal.   EKG:  BMET    Component Value Date/Time   NA 138 09/01/2020 1440   K 3.8 09/01/2020 1440   CL 100 09/01/2020 1440   CO2 25 09/01/2020 1440   GLUCOSE 100 (H) 09/01/2020 1440   BUN 21 09/01/2020 1440   CREATININE 0.80 09/01/2020 1440   CALCIUM 10.3 09/01/2020 1440    Lipid Panel     Component Value Date/Time   CHOL 250 (H) 09/01/2020 1440   TRIG 153.0 (H) 09/01/2020 1440   HDL 46.60 09/01/2020 1440   CHOLHDL 5 09/01/2020 1440   VLDL 30.6 09/01/2020 1440   LDLCALC 173 (H) 09/01/2020 1440    CBC    Component Value Date/Time   WBC 11.4 (H) 09/01/2020 1440   RBC 4.00 09/01/2020 1440   HGB 12.1 09/01/2020 1440   HCT 36.4 09/01/2020 1440   PLT 338.0 09/01/2020 1440   MCV 90.9 09/01/2020 1440   MCHC 33.1 09/01/2020 1440   RDW 13.7 09/01/2020 1440    Hgb A1C Lab Results  Component Value Date   HGBA1C 6.3 09/01/2020            Assessment & Plan:

## 2021-11-09 NOTE — Assessment & Plan Note (Signed)
Controlled on Lisinopril HCT Reinforced DASH diet and exercise for weight loss C-Met today

## 2021-11-09 NOTE — Assessment & Plan Note (Signed)
A1c today Encourage low-carb diet and exercise for weight loss 

## 2021-11-09 NOTE — Assessment & Plan Note (Signed)
Continue Albuterol as needed 

## 2021-11-09 NOTE — Patient Instructions (Signed)
Heart-Healthy Eating Plan Heart-healthy meal planning includes: Eating less unhealthy fats. Eating more healthy fats. Making other changes in your diet. Talk with your doctor or a diet specialist (dietitian) to create an eating plan that is right for you. What is my plan? Your doctor may recommend an eating plan that includes: Total fat: ______% or less of total calories a day. Saturated fat: ______% or less of total calories a day. Cholesterol: less than _________mg a day. What are tips for following this plan? Cooking Avoid frying your food. Try to bake, boil, grill, or broil it instead. You can also reduce fat by: Removing the skin from poultry. Removing all visible fats from meats. Steaming vegetables in water or broth. Meal planning  At meals, divide your plate into four equal parts: Fill one-half of your plate with vegetables and [place] salads. Fill one-fourth of your plate with whole grains. Fill one-fourth of your plate with lean protein foods. Eat 4-5 servings of vegetables per day. A serving of vegetables is: 1 cup of raw or cooked vegetables. 2 cups of raw leafy greens. Eat 4-5 servings of fruit per day. A serving of fruit is: 1 medium whole fruit.  cup of dried fruit.  cup of fresh, frozen, or canned fruit.  cup of 100% fruit juice. Eat more foods that have soluble fiber. These are apples, broccoli, carrots, beans, peas, and barley. Try to get 20-30 g of fiber per day. Eat 4-5 servings of nuts, legumes, and seeds per week: 1 serving of dried beans or legumes equals  cup after being cooked. 1 serving of nuts is  cup. 1 serving of seeds equals 1 tablespoon. General information Eat more home-cooked food. Eat less restaurant, buffet, and fast food. Limit or avoid alcohol. Limit foods that are high in starch and sugar. Avoid fried foods. Lose weight if you are overweight. Keep track of how much salt (sodium) you eat. This is important if you have high blood  pressure. Ask your doctor to tell you more about this. Try to add vegetarian meals each week. Fats Choose healthy fats. These include olive oil and canola oil, flaxseeds, walnuts, almonds, and seeds. Eat more omega-3 fats. These include salmon, mackerel, sardines, tuna, flaxseed oil, and ground flaxseeds. Try to eat fish at least 2 times each week. Check food labels. Avoid foods with trans fats or high amounts of saturated fat. Limit saturated fats. These are often found in animal products, such as meats, butter, and cream. These are also found in plant foods, such as palm oil, palm kernel oil, and coconut oil. Avoid foods with partially hydrogenated oils in them. These have trans fats. Examples are stick margarine, some tub margarines, cookies, crackers, and other baked goods. What foods can I eat? Fruits All fresh, canned (in natural juice), or frozen fruits. Vegetables Fresh or frozen vegetables (raw, steamed, roasted, or grilled). [place] salads. Grains Most grains. Choose whole wheat and whole grains most of the time. Rice and pasta, including brown rice and pastas made with whole wheat. Meats and other proteins Lean, well-trimmed beef, veal, pork, and lamb. Chicken and turkey without skin. All fish and shellfish. Wild duck, rabbit, pheasant, and venison. Egg whites or low-cholesterol egg substitutes. Dried beans, peas, lentils, and tofu. Seeds and most nuts. Dairy Low-fat or nonfat cheeses, including ricotta and mozzarella. Skim or 1% milk that is liquid, powdered, or evaporated. Buttermilk that is made with low-fat milk. Nonfat or low-fat yogurt. Fats and oils Non-hydrogenated (trans-free) margarines. Vegetable oils, including   soybean, sesame, sunflower, olive, peanut, safflower, corn, canola, and cottonseed. Salad dressings or mayonnaise made with a vegetable oil. Beverages Mineral water. Coffee and tea. Diet carbonated beverages. Sweets and desserts Sherbet, gelatin, and fruit ice.  Small amounts of dark chocolate. Limit all sweets and desserts. Seasonings and condiments All seasonings and condiments. The items listed above may not be a complete list of foods and drinks you can eat. Contact a dietitian for more options. What foods should I avoid? Fruits Canned fruit in heavy syrup. Fruit in cream or butter sauce. Fried fruit. Limit coconut. Vegetables Vegetables cooked in cheese, cream, or butter sauce. Fried vegetables. Grains Breads that are made with saturated or trans fats, oils, or whole milk. Croissants. Sweet rolls. Donuts. High-fat crackers, such as cheese crackers. Meats and other proteins Fatty meats, such as hot dogs, ribs, sausage, bacon, rib-eye roast or steak. High-fat deli meats, such as salami and bologna. Caviar. Domestic duck and goose. Organ meats, such as liver. Dairy Cream, sour cream, cream cheese, and creamed cottage cheese. Whole-milk cheeses. Whole or 2% milk that is liquid, evaporated, or condensed. Whole buttermilk. Cream sauce or high-fat cheese sauce. Yogurt that is made from whole milk. Fats and oils Meat fat, or shortening. Cocoa butter, hydrogenated oils, palm oil, coconut oil, palm kernel oil. Solid fats and shortenings, including bacon fat, salt pork, lard, and butter. Nondairy cream substitutes. Salad dressings with cheese or sour cream. Beverages Regular sodas and juice drinks with added sugar. Sweets and desserts Frosting. Pudding. Cookies. Cakes. Pies. Milk chocolate or white chocolate. Buttered syrups. Full-fat ice cream or ice cream drinks. The items listed above may not be a complete list of foods and drinks to avoid. Contact a dietitian for more information. Summary Heart-healthy meal planning includes eating less unhealthy fats, eating more healthy fats, and making other changes in your diet. Eat a balanced diet. This includes fruits and vegetables, low-fat or nonfat dairy, lean protein, nuts and legumes, whole grains, and  heart-healthy oils and fats. This information is not intended to replace advice given to you by your health care provider. Make sure you discuss any questions you have with your health care provider. Document Revised: 04/27/2021 Document Reviewed: 04/27/2021 Elsevier Patient Education  2022 Elsevier Inc.  

## 2021-11-09 NOTE — Assessment & Plan Note (Signed)
Normal blood counts Okay to continue iron as needed

## 2021-11-10 LAB — HEMOGLOBIN A1C
Hgb A1c MFr Bld: 6 % of total Hgb — ABNORMAL HIGH (ref <5.7)
Mean Plasma Glucose: 126 mg/dL
eAG (mmol/L): 7 mmol/L

## 2021-11-10 LAB — COMPLETE METABOLIC PANEL WITH GFR
AG Ratio: 2 (calc) (ref 1.0–2.5)
ALT: 28 U/L (ref 6–29)
AST: 20 U/L (ref 10–35)
Albumin: 4.4 g/dL (ref 3.6–5.1)
Alkaline phosphatase (APISO): 67 U/L (ref 37–153)
BUN: 17 mg/dL (ref 7–25)
CO2: 29 mmol/L (ref 20–32)
Calcium: 10.3 mg/dL (ref 8.6–10.4)
Chloride: 100 mmol/L (ref 98–110)
Creat: 0.64 mg/dL (ref 0.50–1.05)
Globulin: 2.2 g/dL (calc) (ref 1.9–3.7)
Glucose, Bld: 116 mg/dL (ref 65–139)
Potassium: 4.2 mmol/L (ref 3.5–5.3)
Sodium: 139 mmol/L (ref 135–146)
Total Bilirubin: 0.5 mg/dL (ref 0.2–1.2)
Total Protein: 6.6 g/dL (ref 6.1–8.1)
eGFR: 99 mL/min/{1.73_m2} (ref >=60)

## 2021-11-10 LAB — CBC
HCT: 36.7 % (ref 35.0–45.0)
Hemoglobin: 12 g/dL (ref 11.7–15.5)
MCH: 30.8 pg (ref 27.0–33.0)
MCHC: 32.7 g/dL (ref 32.0–36.0)
MCV: 94.3 fL (ref 80.0–100.0)
MPV: 10.3 fL (ref 7.5–12.5)
Platelets: 367 10*3/uL (ref 140–400)
RBC: 3.89 10*6/uL (ref 3.80–5.10)
RDW: 12.6 % (ref 11.0–15.0)
WBC: 9.8 10*3/uL (ref 3.8–10.8)

## 2021-11-10 LAB — LIPID PANEL
Cholesterol: 261 mg/dL — ABNORMAL HIGH (ref <200)
HDL: 50 mg/dL (ref >=50)
LDL Cholesterol (Calc): 166 mg/dL (calc) — ABNORMAL HIGH
Non-HDL Cholesterol (Calc): 211 mg/dL (calc) — ABNORMAL HIGH (ref <130)
Total CHOL/HDL Ratio: 5.2 (calc) — ABNORMAL HIGH (ref <5.0)
Triglycerides: 273 mg/dL — ABNORMAL HIGH (ref <150)

## 2021-12-05 ENCOUNTER — Other Ambulatory Visit: Payer: Self-pay

## 2021-12-05 DIAGNOSIS — I10 Essential (primary) hypertension: Secondary | ICD-10-CM

## 2021-12-05 MED ORDER — LISINOPRIL-HYDROCHLOROTHIAZIDE 20-25 MG PO TABS
1.0000 | ORAL_TABLET | Freq: Every day | ORAL | 2 refills | Status: DC
Start: 2021-12-05 — End: 2022-09-11

## 2022-09-09 ENCOUNTER — Other Ambulatory Visit: Payer: Self-pay | Admitting: Internal Medicine

## 2022-09-09 DIAGNOSIS — I10 Essential (primary) hypertension: Secondary | ICD-10-CM

## 2022-09-11 NOTE — Telephone Encounter (Signed)
Called pt made follow up appt.  

## 2022-09-11 NOTE — Telephone Encounter (Signed)
Courtesy refill. Requested Prescriptions  Pending Prescriptions Disp Refills  . lisinopril-hydrochlorothiazide (ZESTORETIC) 20-25 MG tablet [Pharmacy Med Name: Lisinopril-hydroCHLOROthiazide 20-25 MG Oral Tablet] 45 tablet 0    Sig: TAKE 1 TABLET BY MOUTH ONCE DAILY (OFFICE  VISIT  REQUIRED  FOR  FURTHER  REFILLS)     Cardiovascular:  ACEI + Diuretic Combos Failed - 09/09/2022 10:07 AM      Failed - Na in normal range and within 180 days    Sodium  Date Value Ref Range Status  11/09/2021 139 135 - 146 mmol/L Final         Failed - K in normal range and within 180 days    Potassium  Date Value Ref Range Status  11/09/2021 4.2 3.5 - 5.3 mmol/L Final         Failed - Cr in normal range and within 180 days    Creat  Date Value Ref Range Status  11/09/2021 0.64 0.50 - 1.05 mg/dL Final         Failed - eGFR is 30 or above and within 180 days    GFR  Date Value Ref Range Status  09/01/2020 72.49 >60.00 mL/min Final   eGFR  Date Value Ref Range Status  11/09/2021 99 > OR = 60 mL/min/1.34m Final    Comment:    The eGFR is based on the CKD-EPI 2021 equation. To calculate  the new eGFR from a previous Creatinine or Cystatin C result, go to https://www.kidney.org/professionals/ kdoqi/gfr%5Fcalculator          Failed - Valid encounter within last 6 months    Recent Outpatient Visits          10 months ago [facility]      Future Appointments            [place] [facility] Patient is not pregnant      Passed - Last BP in normal range    BP Readings from Last 1 Encounters:  11/09/21 137/82

## 2022-10-16 ENCOUNTER — Ambulatory Visit: Payer: Medicaid Other | Admitting: Internal Medicine

## 2022-10-23 ENCOUNTER — Other Ambulatory Visit: Payer: Self-pay | Admitting: Internal Medicine

## 2022-10-23 DIAGNOSIS — I10 Essential (primary) hypertension: Secondary | ICD-10-CM

## 2022-10-24 NOTE — Telephone Encounter (Signed)
Requested medication (s) are due for refill today: yes  Requested medication (s) are on the active medication list: yes  Last refill:  09/11/22  Future visit scheduled: yes  Notes to clinic:  Unable to refill per protocol, courtesy refill already given, routing for provider approval. Patient has future OV scheduled.     Requested Prescriptions  Pending Prescriptions Disp Refills   lisinopril-hydrochlorothiazide (ZESTORETIC) 20-25 MG tablet [Pharmacy Med Name: Lisinopril-hydroCHLOROthiazide 20-25 MG Oral Tablet] 45 tablet 0    Sig: TAKE 1 TABLET BY MOUTH ONCE DAILY (OFFICE  VISIT  REQUIRED  FOR  FURTHER  REFILLS)     Cardiovascular:  ACEI + Diuretic Combos Failed - 10/23/2022 12:03 PM      Failed - Na in normal range and within 180 days    Sodium  Date Value Ref Range Status  11/09/2021 139 135 - 146 mmol/L Final         Failed - K in normal range and within 180 days    Potassium  Date Value Ref Range Status  11/09/2021 4.2 3.5 - 5.3 mmol/L Final         Failed - Cr in normal range and within 180 days    Creat  Date Value Ref Range Status  11/09/2021 0.64 0.50 - 1.05 mg/dL Final         Failed - eGFR is 30 or above and within 180 days    GFR  Date Value Ref Range Status  09/01/2020 72.49 >60.00 mL/min Final   eGFR  Date Value Ref Range Status  11/09/2021 99 > OR = 60 mL/min/1.59m Final    Comment:    The eGFR is based on the CKD-EPI 2021 equation. To calculate  the new eGFR from a previous Creatinine or Cystatin C result, go to https://www.kidney.org/professionals/ kdoqi/gfr%5Fcalculator          Failed - Valid encounter within last 6 months    Recent Outpatient Visits           11 months ago [facility]iabetes   [facility]       Future Appointments             In 1 week [facility] [place] Patient is not [facility]      Passed - Last BP in normal range    BP  Readings from Last 1 Encounters:  11/09/21 137/82

## 2022-11-06 ENCOUNTER — Ambulatory Visit (INDEPENDENT_AMBULATORY_CARE_PROVIDER_SITE_OTHER): Payer: PPO | Admitting: Internal Medicine

## 2022-11-06 ENCOUNTER — Encounter: Payer: Self-pay | Admitting: Internal Medicine

## 2022-11-06 VITALS — BP 126/78 | HR 70 | Temp 96.9°F | Ht 63.0 in | Wt 264.0 lb

## 2022-11-06 DIAGNOSIS — J4520 Mild intermittent asthma, uncomplicated: Secondary | ICD-10-CM

## 2022-11-06 DIAGNOSIS — M170 Bilateral primary osteoarthritis of knee: Secondary | ICD-10-CM

## 2022-11-06 DIAGNOSIS — M816 Localized osteoporosis [Lequesne]: Secondary | ICD-10-CM

## 2022-11-06 DIAGNOSIS — K2270 Barrett's esophagus without dysplasia: Secondary | ICD-10-CM

## 2022-11-06 DIAGNOSIS — E782 Mixed hyperlipidemia: Secondary | ICD-10-CM

## 2022-11-06 DIAGNOSIS — I739 Peripheral vascular disease, unspecified: Secondary | ICD-10-CM

## 2022-11-06 DIAGNOSIS — R7303 Prediabetes: Secondary | ICD-10-CM | POA: Diagnosis not present

## 2022-11-06 DIAGNOSIS — I10 Essential (primary) hypertension: Secondary | ICD-10-CM | POA: Diagnosis not present

## 2022-11-06 DIAGNOSIS — Z1231 Encounter for screening mammogram for malignant neoplasm of breast: Secondary | ICD-10-CM | POA: Diagnosis not present

## 2022-11-06 DIAGNOSIS — Z780 Asymptomatic menopausal state: Secondary | ICD-10-CM | POA: Diagnosis not present

## 2022-11-06 DIAGNOSIS — Z6841 Body Mass Index (BMI) 40.0 and over, adult: Secondary | ICD-10-CM

## 2022-11-06 DIAGNOSIS — E66813 Obesity, class 3: Secondary | ICD-10-CM

## 2022-11-06 DIAGNOSIS — E611 Iron deficiency: Secondary | ICD-10-CM

## 2022-11-06 DIAGNOSIS — K219 Gastro-esophageal reflux disease without esophagitis: Secondary | ICD-10-CM

## 2022-11-06 MED ORDER — LISINOPRIL-HYDROCHLOROTHIAZIDE 20-25 MG PO TABS: ORAL_TABLET | ORAL | 1 refills | Status: DC

## 2022-11-06 MED ORDER — PANTOPRAZOLE SODIUM 40 MG PO TBEC: 40.0000 mg | DELAYED_RELEASE_TABLET | Freq: Every day | ORAL | 1 refills | Status: DC

## 2022-11-06 NOTE — Assessment & Plan Note (Signed)
A1c today Encourage low-carb diet and exercise for weight loss 

## 2022-11-06 NOTE — Assessment & Plan Note (Signed)
C-Met and lipid profile today She is adamant against trying cholesterol-lowering medication due to side effects and risk for dementia Discussed risk of heart attack, stroke and possible early death-she is aware of these risks Encourage low-fat diet

## 2022-11-06 NOTE — Progress Notes (Signed)
Subjective:    Patient ID: Patricia Garner, female    DOB: 06-01-1956, 66 y.o.   MRN: [id number]  HPI  Patient presents to clinic today for follow-up of chronic conditions.  GERD with Barrett's Esophagus: Triggered by spicy foods and eating late at night.  She takes Pantoprazole only as needed.  Upper GI from 09/2019 reviewed.  HTN: Her BP today is 126/78.  She is taking Lisinopril HCT as prescribed.  There is no ECG on file.  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  She takes Tylenol Arthritis as needed with some relief of symptoms.  She follows with orthopedics.  Osteoporosis: She is taking Calcium and Vitamin D OTC.  She has not been getting weightbearing exercise daily.  Bone density from 02/2008 reviewed.  Asthma: She denies chronic cough or shortness of breath.  She uses Albuterol as needed with good relief of symptoms.  There are no PFTs on file.  Iron Deficiency Anemia: Her last H/H was 12/36.7, 10/2021.  She takes oral Iron only as needed.  She does not follow with hematology.  HLD: Her last LDL was 166, triglycerides 273, 10/2021.  She is not taking any cholesterol-lowering medication at this time.  She does not consume a low-fat diet.  Prediabetes: Her last A1c was 6%, 11/2019.  She is not taking any oral diabetic medication at this time.  She does not check her sugars.  Review of Systems  Past Medical History:  Diagnosis Date   Allergy    Arthritis    Asthma    Barrett esophagus    Chicken pox    Chronic vaginitis    GERD (gastroesophageal reflux disease)    History of colon polyps    Hyperlipidemia    Hypertension    Osteoporosis    Vitamin D deficiency     Current Outpatient Medications  Medication Sig Dispense Refill   albuterol (VENTOLIN HFA) 108 (90 Base) MCG/ACT inhaler INHALE 1 TO 2 PUFFS BY MOUTH EVERY 6 HOURS AS NEEDED FOR WHEEZING OR SHORTNESS OF BREATH 8 g 5   Calcium-Vitamin D 600-200 MG-UNIT tablet Take by mouth.     clotrimazole-betamethasone (LOTRISONE) cream  APPLY TOPICALLY TWICE DAILY FOR TWO WEEKS. THEN USE TWICE DAILY AS NEEDED FOR SYMPTOMS 45 g 0   ferrous sulfate 325 (65 FE) MG EC tablet Take 325 mg by mouth as needed.     lisinopril-hydrochlorothiazide (ZESTORETIC) 20-25 MG tablet TAKE 1 TABLET BY MOUTH ONCE DAILY (OFFICE  VISIT  REQUIRED  FOR  FURTHER  REFILLS) 30 tablet 0   Moringa 500 MG CAPS Take by mouth.     Multiple Vitamins-Minerals (MULTIVITAMIN WITH MINERALS) tablet Take by mouth.     pantoprazole (PROTONIX) 40 MG tablet Take 1 tablet (40 mg total) by mouth daily. (Patient not taking: Reported on 11/09/2021) 90 tablet 2   No current facility-administered medications for this visit.    Allergies  Allergen Reactions   Bactrim [Sulfamethoxazole-Trimethoprim] Nausea Only   Codeine Nausea And Vomiting   Hydrocodone Hives   Lipitor [Atorvastatin] Other (See Comments)    Myalgia    Medrol [Methylprednisolone] Nausea Only    Family History  Problem Relation Age of Onset   Arthritis Mother    Heart disease Mother    Stroke Mother    Hypertension Mother    Alcohol abuse Father    Alcohol abuse Sister    Mental illness Sister    Stomach cancer Sister 7   Alcohol abuse Brother    Drug  abuse Brother    Arthritis Maternal Grandmother    Stroke Maternal Grandmother    Colon cancer Maternal Grandfather 37    Social History   Socioeconomic History   Marital status: Married    Spouse name: Not on file   Number of children: Not on file   Years of education: Not on file   Highest education level: Not on file  Occupational History   Not on file  Tobacco Use   Smoking status: Never   Smokeless tobacco: Never  Vaping Use   Vaping Use: Never used  Substance and Sexual Activity   Alcohol use: No    Alcohol/week: 0.0 standard drinks of alcohol   Drug use: Never   Sexual activity: Yes    Birth control/protection: Post-menopausal  Other Topics Concern   Not on file  Social History Narrative   Not on file   Social  Determinants of Health   Financial Resource Strain: Not on file  Food Insecurity: Not on file  Transportation Needs: Not on file  Physical Activity: Not on file  Stress: Not on file  Social Connections: Not on file  Intimate Partner Violence: Not on file     Constitutional: Denies fever, malaise, fatigue, headache or abrupt weight changes.  HEENT: Denies eye pain, eye redness, ear pain, ringing in the ears, wax buildup, runny nose, nasal congestion, bloody nose, or sore throat. Respiratory: Denies difficulty breathing, shortness of breath, cough or sputum production.   Cardiovascular: Denies chest pain, chest tightness, palpitations or swelling in the hands or feet.  Gastrointestinal: Patient reports intermittent reflux.  Denies abdominal pain, bloating, constipation, diarrhea or blood in the stool.  GU: Denies urgency, frequency, pain with urination, burning sensation, blood in urine, odor or discharge. Musculoskeletal: Patient reports knee pain.  Denies decrease in range of motion, difficulty with gait, muscle pain or joint swelling.  Skin: Patient reports discoloration of right foot, painful lumps of abdomen.  Denies redness, rashes, lesions or ulcercations.  Neurological: Denies dizziness, difficulty with memory, difficulty with speech or problems with balance and coordination.  Psych: Denies anxiety, depression, SI/HI.  No other specific complaints in a complete review of systems (except as listed in HPI above).     Objective:   Physical Exam   BP 126/78 (BP Location: Left Wrist, Patient Position: Sitting, Cuff Size: Normal)   Pulse 70   Temp (!) 96.9 F (36.1 C) (Temporal)   Ht '5\' 3"'$  (1.6 m)   Wt 264 lb (119.7 kg)   SpO2 100%   BMI 46.77 kg/m   Wt Readings from Last 3 Encounters:  11/09/21 271 lb 3.2 oz (123 kg)  09/01/20 274 lb (124.3 kg)  07/20/20 274 lb (124.3 kg)    General: Appears her stated age, obese, in NAD. Skin: Warm, dry and intact.  PVD changes noted  of right foot without ulceration.  Multiple palpable lumps noted of subcutaneous tissue of abdomen but hard to differentiate if this is subcutaneous fat versus an actual mass. HEENT: Head: normal shape and size; Eyes: sclera white, no icterus, conjunctiva pink, PERRLA and EOMs intact;  Neck:  Neck supple, trachea midline. No masses, lumps or thyromegaly present.  Cardiovascular: Normal rate and rhythm. S1,S2 noted.  No murmur, rubs or gallops noted. No JVD or BLE edema.  Pulmonary/Chest: Normal effort and positive vesicular breath sounds. No respiratory distress. No wheezes, rales or ronchi noted.  Abdomen: Soft and nontender. Normal bowel sounds.  Musculoskeletal: No difficulty with gait.  Neurological: Alert  and oriented. Cranial nerves II-XII grossly intact. Coordination normal.  Psychiatric: Mood and affect normal. Behavior is normal. Judgment and thought content normal.    BMET    Component Value Date/Time   NA 139 11/09/2021 1001   K 4.2 11/09/2021 1001   CL 100 11/09/2021 1001   CO2 29 11/09/2021 1001   GLUCOSE 116 11/09/2021 1001   BUN 17 11/09/2021 1001   CREATININE 0.64 11/09/2021 1001   CALCIUM 10.3 11/09/2021 1001    Lipid Panel     Component Value Date/Time   CHOL 261 (H) 11/09/2021 1001   TRIG 273 (H) 11/09/2021 1001   HDL 50 11/09/2021 1001   CHOLHDL 5.2 (H) 11/09/2021 1001   VLDL 30.6 09/01/2020 1440   LDLCALC 166 (H) 11/09/2021 1001    CBC    Component Value Date/Time   WBC 9.8 11/09/2021 1001   RBC 3.89 11/09/2021 1001   HGB 12.0 11/09/2021 1001   HCT 36.7 11/09/2021 1001   PLT 367 11/09/2021 1001   MCV 94.3 11/09/2021 1001   MCH 30.8 11/09/2021 1001   MCHC 32.7 11/09/2021 1001   RDW 12.6 11/09/2021 1001    Hgb A1C Lab Results  Component Value Date   HGBA1C 6.0 (H) 11/09/2021          Assessment & Plan:   Mass of Subcutaneous Tissue of Abdomen:  Discussed obtaining ultrasound for further evaluation to see if they could see what this is  however it is likely that it would not be well visualized She is concerned about adhesions, advised her I could refer her to general surgery to do exploratory laparotomy over concerns for adhesions but she wants to hold off at this time  RTC in 6 months for annual exam Webb Silversmith, NP

## 2022-11-06 NOTE — Patient Instructions (Signed)

## 2022-11-06 NOTE — Assessment & Plan Note (Signed)
Controlled on lisinopril HCT Reinforced DASH diet and exercise for weight loss C-Met today

## 2022-11-06 NOTE — Assessment & Plan Note (Signed)
Deteriorated because she has not been taking pantoprazole, refilled today Encouraged her to avoid foods that trigger reflux Encouraged weight loss as this can help reduce reflux symptoms

## 2022-11-06 NOTE — Assessment & Plan Note (Signed)
Bone density ordered Continue calcium and vitamin D OTC Encourage daily weightbearing exercise

## 2022-11-06 NOTE — Assessment & Plan Note (Signed)
Encourage weight loss as this can help reduce joint pain Continue Tylenol arthritis as needed

## 2022-11-06 NOTE — Assessment & Plan Note (Signed)
Continue albuterol as needed 

## 2022-11-06 NOTE — Assessment & Plan Note (Signed)
Encourage weight loss as this can help reduce PVD changes Discussed the importance of cholesterol control, lipid profile today

## 2022-11-06 NOTE — Assessment & Plan Note (Signed)
CBC today Continue oral iron as needed

## 2022-11-06 NOTE — Assessment & Plan Note (Signed)
Encourage diet and exercise for weight loss 

## 2022-11-07 LAB — COMPLETE METABOLIC PANEL WITH GFR
AG Ratio: 1.9 (calc) (ref 1.0–2.5)
ALT: 21 U/L (ref 6–29)
AST: 16 U/L (ref 10–35)
Albumin: 4.5 g/dL (ref 3.6–5.1)
Alkaline phosphatase (APISO): 64 U/L (ref 37–153)
BUN: 16 mg/dL (ref 7–25)
CO2: 30 mmol/L (ref 20–32)
Calcium: 10.6 mg/dL — ABNORMAL HIGH (ref 8.6–10.4)
Chloride: 100 mmol/L (ref 98–110)
Creat: 0.66 mg/dL (ref 0.50–1.05)
Globulin: 2.4 g/dL (calc) (ref 1.9–3.7)
Glucose, Bld: 109 mg/dL — ABNORMAL HIGH (ref 65–99)
Potassium: 4.2 mmol/L (ref 3.5–5.3)
Sodium: 141 mmol/L (ref 135–146)
Total Bilirubin: 0.6 mg/dL (ref 0.2–1.2)
Total Protein: 6.9 g/dL (ref 6.1–8.1)
eGFR: 97 mL/min/{1.73_m2} (ref >=60)

## 2022-11-07 LAB — LIPID PANEL
Cholesterol: 257 mg/dL — ABNORMAL HIGH (ref <200)
HDL: 48 mg/dL — ABNORMAL LOW (ref >=50)
LDL Cholesterol (Calc): 170 mg/dL (calc) — ABNORMAL HIGH
Non-HDL Cholesterol (Calc): 209 mg/dL (calc) — ABNORMAL HIGH (ref <130)
Total CHOL/HDL Ratio: 5.4 (calc) — ABNORMAL HIGH (ref <5.0)
Triglycerides: 219 mg/dL — ABNORMAL HIGH (ref <150)

## 2022-11-07 LAB — CBC
HCT: 36.8 % (ref 35.0–45.0)
Hemoglobin: 12.4 g/dL (ref 11.7–15.5)
MCH: 31.4 pg (ref 27.0–33.0)
MCHC: 33.7 g/dL (ref 32.0–36.0)
MCV: 93.2 fL (ref 80.0–100.0)
MPV: 10.5 fL (ref 7.5–12.5)
Platelets: 341 10*3/uL (ref 140–400)
RBC: 3.95 10*6/uL (ref 3.80–5.10)
RDW: 12.4 % (ref 11.0–15.0)
WBC: 7.8 10*3/uL (ref 3.8–10.8)

## 2022-11-07 LAB — HEMOGLOBIN A1C
Hgb A1c MFr Bld: 6.3 % of total Hgb — ABNORMAL HIGH (ref <5.7)
Mean Plasma Glucose: 134 mg/dL
eAG (mmol/L): 7.4 mmol/L

## 2022-12-11 ENCOUNTER — Other Ambulatory Visit: Payer: Self-pay | Admitting: Internal Medicine

## 2022-12-11 DIAGNOSIS — J4520 Mild intermittent asthma, uncomplicated: Secondary | ICD-10-CM

## 2022-12-11 NOTE — Telephone Encounter (Signed)
Requested medication (s) are due for refill today - expired Rx  Requested medication (s) are on the active medication list -yes  Future visit scheduled -no  Last refill: 12/02/20 8g 5RF  Notes to clinic: expired Rx  Requested Prescriptions  Pending Prescriptions Disp Refills   albuterol (VENTOLIN HFA) 108 (90 Base) MCG/ACT inhaler 8 g 5    Sig: INHALE 1 TO 2 PUFFS BY MOUTH EVERY 6 HOURS AS NEEDED FOR WHEEZING OR SHORTNESS OF BREATH     Pulmonology:  [facility] 2 Passed - 12/11/2022  2:19 PM      Passed - Last BP in normal range    BP Readings from Last 1 Encounters:  11/06/22 126/78         Passed - Last Heart Rate in normal range    Pulse Readings from Last 1 Encounters:  11/06/22 70         Passed - Valid encounter within last 12 months    Recent Outpatient Visits           1 month ago Prediabetes   [facility] [place], Coralie Keens, NP   1 year ago Prediabetes   [facility] [place], Coralie Keens, NP                 Requested Prescriptions  Pending Prescriptions Disp Refills   albuterol (VENTOLIN HFA) 108 (90 Base) MCG/ACT inhaler 8 g 5    Sig: INHALE 1 TO 2 PUFFS BY MOUTH EVERY 6 HOURS AS NEEDED FOR WHEEZING OR SHORTNESS OF BREATH     Pulmonology:  [facility] 2 Passed - 12/11/2022  2:19 PM      Passed - Last BP in normal range    BP Readings from Last 1 Encounters:  11/06/22 126/78         Passed - Last Heart Rate in normal range    Pulse Readings from Last 1 Encounters:  11/06/22 70         Passed - Valid encounter within last 12 months    Recent Outpatient Visits           1 month ago Prediabetes   [facility] [place], Coralie Keens, NP   1 year ago Prediabetes   [facility] [place], Coralie Keens, NP

## 2022-12-11 NOTE — Telephone Encounter (Signed)
Medication Refill - Medication: albuterol (VENTOLIN HFA) 108 (90 Base) MCG/ACT inhaler   Has the patient contacted their pharmacy? Yes.    Per pharmacy patient needs to contact PCP.  Preferred Pharmacy (with phone number or street name):  [place], [place] Phone: [phone]  Fax: [phone]     Has the patient been seen for an appointment in the last year OR does the patient have an upcoming appointment? Yes.    Agent: Please be advised that RX refills may take up to 3 business days. We ask that you follow-up with your pharmacy.

## 2022-12-12 MED ORDER — ALBUTEROL SULFATE HFA 108 (90 BASE) MCG/ACT IN AERS: INHALATION_SPRAY | RESPIRATORY_TRACT | 5 refills | Status: DC

## 2023-01-27 NOTE — Progress Notes (Unsigned)
No chief complaint on file.   HPI:      Patricia Garner is a 67 y.o. [id number] who LMP was No LMP recorded. Patient is postmenopausal., presents today for her annual examination.  Her menses are absent due to menopause.  She does not have postmenopausal bleeding. She does not have vasomotor sx.   Sex activity: single partner, contraception - post menopausal status. She does have vaginal dryness. She uses lubricants with relief.   Last Pap: 07/01/20 Results were: no abnormalities /neg HPV DNA.   She has a hx of chronic vaginal irritation. Dr. Ammie Dalton gave her mycolog crm that helped initially, but sx persisted. She sweats a lot vaginally. Rx changed to lotrisone crm 4/18 with sx improvement. Doesn't need RF today. Sx flare with sweating. Was out in yard yesterday and in damp underwear. Sx worse today. Doesn't look like LS in past on exam.   Last mammogram: 08/18/19  Results were: normal--routine follow-up in 12 months There is no FH of breast cancer. There is no FH of ovarian cancer. The patient does not do self-breast exams.  Colonoscopy: colonoscopy 2017; Repeat after 5 yrs per pt report.  Tobacco use: The patient denies current or previous tobacco use. Alcohol use: none  No drug use Exercise: not active  She does get adequate calcium and Vitamin D in her diet. Labs with PCP. Has hyperlipidemia and Vit D deficiency.    Past Medical History:  Diagnosis Date   Allergy    Arthritis    Asthma    Barrett esophagus    Chicken pox    Chronic vaginitis    GERD (gastroesophageal reflux disease)    History of colon polyps    Hyperlipidemia    Hypertension    Osteoporosis    Vitamin D deficiency     Past Surgical History:  Procedure Laterality Date   CHOLECYSTECTOMY  1983   COLONOSCOPY WITH PROPOFOL N/A 08/28/2016   Procedure: COLONOSCOPY WITH PROPOFOL;  Surgeon: Lollie Sails, MD;  Location: [facility] ENDOSCOPY;  Service: Endoscopy;  Laterality: N/A;    ESOPHAGOGASTRODUODENOSCOPY (EGD) WITH PROPOFOL N/A 08/28/2016   Procedure: ESOPHAGOGASTRODUODENOSCOPY (EGD) WITH PROPOFOL;  Surgeon: Lollie Sails, MD;  Location: [facility] ENDOSCOPY;  Service: Endoscopy;  Laterality: N/A;   ESOPHAGOGASTRODUODENOSCOPY (EGD) WITH PROPOFOL N/A 09/15/2019   Procedure: ESOPHAGOGASTRODUODENOSCOPY (EGD) WITH PROPOFOL;  Surgeon: Lollie Sails, MD;  Location: [facility] ENDOSCOPY;  Service: Endoscopy;  Laterality: N/A;   TONSILLECTOMY  1969    Family History  Problem Relation Age of Onset   Arthritis Mother    Heart disease Mother    Stroke Mother    Hypertension Mother    Alcohol abuse Father    Alcohol abuse Sister    Mental illness Sister    Stomach cancer Sister 84   Alcohol abuse Brother    Drug abuse Brother    Arthritis Maternal Grandmother    Stroke Maternal Grandmother    Colon cancer Maternal Grandfather 40    Social History   Socioeconomic History   Marital status: Married    Spouse name: Not on file   Number of children: Not on file   Years of education: Not on file   Highest education level: Not on file  Occupational History   Not on file  Tobacco Use   Smoking status: Never   Smokeless tobacco: Never  Vaping Use   Vaping Use: Never used  Substance and Sexual Activity   Alcohol use: No  Alcohol/week: 0.0 standard drinks of alcohol   Drug use: Never   Sexual activity: Yes    Birth control/protection: Post-menopausal  Other Topics Concern   Not on file  Social History Narrative   Not on file   Social Determinants of Health   Financial Resource Strain: Not on file  Food Insecurity: Not on file  Transportation Needs: Not on file  Physical Activity: Not on file  Stress: Not on file  Social Connections: Not on file  Intimate Partner Violence: Not on file     Current Outpatient Medications:    albuterol (VENTOLIN HFA) 108 (90 Base) MCG/ACT inhaler, INHALE 1 TO 2 PUFFS BY MOUTH EVERY 6 HOURS AS NEEDED FOR WHEEZING OR  SHORTNESS OF BREATH, Disp: 8 g, Rfl: 5   Calcium-Vitamin D 600-200 MG-UNIT tablet, Take by mouth., Disp: , Rfl:    clotrimazole-betamethasone (LOTRISONE) cream, APPLY TOPICALLY TWICE DAILY FOR TWO WEEKS. THEN USE TWICE DAILY AS NEEDED FOR SYMPTOMS, Disp: 45 g, Rfl: 0   ferrous sulfate 325 (65 FE) MG EC tablet, Take 325 mg by mouth as needed., Disp: , Rfl:    lisinopril-hydrochlorothiazide (ZESTORETIC) 20-25 MG tablet, TAKE 1 TABLET BY MOUTH ONCE DAILY (OFFICE  VISIT  REQUIRED  FOR  FURTHER  REFILLS), Disp: 90 tablet, Rfl: 1   Moringa 500 MG CAPS, Take by mouth., Disp: , Rfl:    Multiple Vitamins-Minerals (MULTIVITAMIN WITH MINERALS) tablet, Take by mouth., Disp: , Rfl:    pantoprazole (PROTONIX) 40 MG tablet, Take 1 tablet (40 mg total) by mouth daily., Disp: 90 tablet, Rfl: 1   ROS:  Review of Systems  Constitutional:  Negative for fever, malaise/fatigue and weight loss.  HENT:  Negative for congestion, ear pain and sinus pain.   Respiratory:  Negative for cough, shortness of breath and wheezing.   Cardiovascular:  Negative for chest pain, orthopnea and leg swelling.  Gastrointestinal:  Negative for constipation, diarrhea, nausea and vomiting.  Genitourinary:  Negative for dysuria, frequency, hematuria and urgency.       Breast ROS: negative   Musculoskeletal:  Negative for back pain, joint pain and myalgias.  Skin:  Negative for itching and rash.  Neurological:  Negative for dizziness, tingling, focal weakness and headaches.  Endo/Heme/Allergies:  Negative for environmental allergies. Does not bruise/bleed easily.  Psychiatric/Behavioral:  Negative for depression and suicidal ideas. The patient is not nervous/anxious and does not have insomnia.     Objective: There were no vitals taken for this visit.   Physical Exam Constitutional:      Appearance: She is well-developed.  Genitourinary:     Vulva normal.     Genitourinary Comments: EXT EXAM WITH ERYTHEMA IN PATCHES BILAT LABIA  MAJORA WITH A LITTLE SKIN BREAKDOWN     No vaginal discharge, erythema or tenderness.      Right Adnexa: not tender and no mass present.    Left Adnexa: not tender and no mass present.    No cervical polyp.     Uterus is not enlarged or tender.  Breasts:    Right: No mass, nipple discharge, skin change or tenderness.     Left: No mass, nipple discharge, skin change or tenderness.  Neck:     Thyroid: No thyromegaly.  Cardiovascular:     Rate and Rhythm: Normal rate and regular rhythm.     Heart sounds: Normal heart sounds. No murmur heard. Pulmonary:     Effort: Pulmonary effort is normal.     Breath sounds: Normal breath sounds.  Abdominal:     Palpations: Abdomen is soft.     Tenderness: There is no abdominal tenderness. There is no guarding.  Musculoskeletal:        General: Normal range of motion.     Cervical back: Normal range of motion.  Neurological:     General: No focal deficit present.     Mental Status: She is alert and oriented to person, place, and time.     Cranial Nerves: No cranial nerve deficit.  Skin:    General: Skin is warm and dry.  Psychiatric:        Mood and Affect: Mood normal.        Behavior: Behavior normal.        Thought Content: Thought content normal.        Judgment: Judgment normal.  Vitals reviewed.      Assessment/Plan:  Encounter for annual routine gynecological examination -   Screening for breast cancer - Plan: MM 3D SCREEN BREAST BILATERAL,Pt to sched mammo  Chronic vaginitis -  Keep area dry. Try A&D oint/Desitin for moisture barrier. Rx RF lotrisone crm prn. F/u prn.           GYN counsel mammography screening, adequate intake of calcium and vitamin D, diet and exercise     F/U  No follow-ups on file.  Emaley Applin B. Allexis Bordenave, PA-C 01/27/2023 6:09 PM

## 2023-01-28 ENCOUNTER — Other Ambulatory Visit (HOSPITAL_COMMUNITY)
Admission: RE | Admit: 2023-01-28 | Discharge: 2023-01-28 | Disposition: A | Discharge status: Home / Self Care | Payer: PPO | Source: Ambulatory Visit | Attending: Obstetrics and Gynecology | Admitting: Obstetrics and Gynecology

## 2023-01-28 ENCOUNTER — Encounter: Payer: Self-pay | Admitting: Obstetrics and Gynecology

## 2023-01-28 ENCOUNTER — Ambulatory Visit (INDEPENDENT_AMBULATORY_CARE_PROVIDER_SITE_OTHER): Payer: PPO | Admitting: Obstetrics and Gynecology

## 2023-01-28 VITALS — BP 133/73 | HR 86 | Ht 63.0 in | Wt 263.0 lb

## 2023-01-28 DIAGNOSIS — Z1231 Encounter for screening mammogram for malignant neoplasm of breast: Secondary | ICD-10-CM

## 2023-01-28 DIAGNOSIS — Z1151 Encounter for screening for human papillomavirus (HPV): Secondary | ICD-10-CM

## 2023-01-28 DIAGNOSIS — N900 Mild vulvar dysplasia: Secondary | ICD-10-CM | POA: Diagnosis not present

## 2023-01-28 DIAGNOSIS — Z01411 Encounter for gynecological examination (general) (routine) with abnormal findings: Secondary | ICD-10-CM | POA: Diagnosis not present

## 2023-01-28 DIAGNOSIS — L900 Lichen sclerosus et atrophicus: Secondary | ICD-10-CM | POA: Diagnosis not present

## 2023-01-28 DIAGNOSIS — N898 Other specified noninflammatory disorders of vagina: Secondary | ICD-10-CM

## 2023-01-28 DIAGNOSIS — Z01419 Encounter for gynecological examination (general) (routine) without abnormal findings: Secondary | ICD-10-CM

## 2023-01-28 DIAGNOSIS — Z1211 Encounter for screening for malignant neoplasm of colon: Secondary | ICD-10-CM

## 2023-01-28 DIAGNOSIS — N893 Dysplasia of vagina, unspecified: Secondary | ICD-10-CM | POA: Diagnosis not present

## 2023-01-28 DIAGNOSIS — Z124 Encounter for screening for malignant neoplasm of cervix: Secondary | ICD-10-CM

## 2023-01-28 NOTE — Patient Instructions (Signed)
I value your feedback and you entrusting us with your care. If you get a [facility] patient survey, I would appreciate you taking the time to let us know about your experience today. Thank you! ? ? ?

## 2023-01-31 ENCOUNTER — Telehealth: Payer: Self-pay | Admitting: Obstetrics and Gynecology

## 2023-01-31 NOTE — Telephone Encounter (Addendum)
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 Surprised not LS given exam but pt could have both dx. Will refer to GYN onc for mgmt/tx. Instructed pt to stop all steroid crms for now. Treat with coconut oil/desitin/A&D etc. Pt aware.

## 2023-01-31 NOTE — Addendum Note (Signed)
Addended by: Ardeth Perfect B on: 06/07/1274 12:55 PM   Modules accepted: Orders

## 2023-02-12 ENCOUNTER — Telehealth: Payer: Self-pay

## 2023-02-12 NOTE — Telephone Encounter (Signed)
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 Records reviewed. Unable to locate any other diagnosis. Call placed to Patricia Garner regarding referral. She reports since she has had recurrences [facility] is requesting assistance in treating. Appointment arranged. Directions to the cancer center provided.

## 2023-02-18 ENCOUNTER — Encounter: Payer: Self-pay | Admitting: Dermatology

## 2023-02-18 ENCOUNTER — Ambulatory Visit: Payer: PPO | Admitting: Dermatology

## 2023-02-18 VITALS — BP 156/78 | HR 71

## 2023-02-18 DIAGNOSIS — L820 Inflamed seborrheic keratosis: Secondary | ICD-10-CM

## 2023-02-18 DIAGNOSIS — L811 Chloasma: Secondary | ICD-10-CM

## 2023-02-18 DIAGNOSIS — Z1283 Encounter for screening for malignant neoplasm of skin: Secondary | ICD-10-CM

## 2023-02-18 DIAGNOSIS — D1801 Hemangioma of skin and subcutaneous tissue: Secondary | ICD-10-CM

## 2023-02-18 DIAGNOSIS — Z79899 Other long term (current) drug therapy: Secondary | ICD-10-CM

## 2023-02-18 DIAGNOSIS — L821 Other seborrheic keratosis: Secondary | ICD-10-CM | POA: Diagnosis not present

## 2023-02-18 DIAGNOSIS — D229 Melanocytic nevi, unspecified: Secondary | ICD-10-CM | POA: Diagnosis not present

## 2023-02-18 DIAGNOSIS — Z86018 Personal history of other benign neoplasm: Secondary | ICD-10-CM | POA: Diagnosis not present

## 2023-02-18 DIAGNOSIS — L814 Other melanin hyperpigmentation: Secondary | ICD-10-CM | POA: Diagnosis not present

## 2023-02-18 DIAGNOSIS — L409 Psoriasis, unspecified: Secondary | ICD-10-CM

## 2023-02-18 DIAGNOSIS — L578 Other skin changes due to chronic exposure to nonionizing radiation: Secondary | ICD-10-CM | POA: Diagnosis not present

## 2023-02-18 MED ORDER — CLOBETASOL PROPIONATE 0.05 % EX SOLN: 1.0000 | Freq: Two times a day (BID) | CUTANEOUS | 1 refills | Status: DC

## 2023-02-18 NOTE — Patient Instructions (Signed)
Due to recent changes in healthcare laws, you may see results of your pathology and/or laboratory studies on MyChart before the doctors have had a chance to review them. We understand that in some cases there may be results that are confusing or concerning to you. Please understand that not all results are received at the same time and often the doctors may need to interpret multiple results in order to provide you with the best plan of care or course of treatment. Therefore, we ask that you please give us 2 business days to thoroughly review all your results before contacting the office for clarification. Should we see a critical lab result, you will be contacted sooner.   If You Need Anything After Your Visit  If you have any questions or concerns for your doctor, please call our main line at [phone] and press option 4 to reach your doctor's medical assistant. If no one answers, please leave a voicemail as directed and we will return your call as soon as possible. Messages left after 4 pm will be answered the following business day.   You may also send us a message via MyChart. We typically respond to MyChart messages within 1-2 business days.  For prescription refills, please ask your pharmacy to contact our office. Our fax number is [phone].  If you have an urgent issue when the clinic is closed that cannot wait until the next business day, you can page your doctor at the number below.    Please note that while we do our best to be available for urgent issues outside of office hours, we are not available 24/7.   If you have an urgent issue and are unable to reach us, you may choose to seek medical care at your doctor's office, retail clinic, urgent care center, or emergency room.  If you have a medical emergency, please immediately call 911 or go to the emergency department.  Pager Numbers  - Dr. Kowalski: [phone]  - Dr. Moye: [phone]  - Dr. Stewart:  [phone]  In the event of inclement weather, please call our main line at [phone] for an update on the status of any delays or closures.  Dermatology Medication Tips: Please keep the boxes that topical medications come in in order to help keep track of the instructions about where and how to use these. Pharmacies typically print the medication instructions only on the boxes and not directly on the medication tubes.   If your medication is too expensive, please contact our office at [phone] option 4 or send us a message through MyChart.   We are unable to tell what your co-pay for medications will be in advance as this is different depending on your insurance coverage. However, we may be able to find a substitute medication at lower cost or fill out paperwork to get insurance to cover a needed medication.   If a prior authorization is required to get your medication covered by your insurance company, please allow us 1-2 business days to complete this process.  Drug prices often vary depending on where the prescription is filled and some pharmacies may offer cheaper prices.  The website www.goodrx.com contains coupons for medications through different pharmacies. The prices here do not account for what the cost may be with help from insurance (it may be cheaper with your insurance), but the website can give you the price if you did not use any insurance.  - You can print the associated coupon and take it with   your prescription to the pharmacy.  - You may also stop by our office during regular business hours and pick up a GoodRx coupon card.  - If you need your prescription sent electronically to a different pharmacy, notify our office through [place] MyChart or by phone at [phone] option 4.     Si Usted Necesita Algo Despus de Su Visita  Tambin puede enviarnos un mensaje a travs de MyChart. Por lo general respondemos a los mensajes de MyChart en el transcurso de 1 a 2  das hbiles.  Para renovar recetas, por favor pida a su farmacia que se ponga en contacto con nuestra oficina. Nuestro nmero de fax es el [phone].  Si tiene un asunto urgente cuando la clnica est cerrada y que no puede esperar hasta el siguiente da hbil, puede llamar/localizar a su doctor(a) al nmero que aparece a continuacin.   Por favor, tenga en cuenta que aunque hacemos todo lo posible para estar disponibles para asuntos urgentes fuera del horario de oficina, no estamos disponibles las 24 horas del da, los 7 das de la semana.   Si tiene un problema urgente y no puede comunicarse con nosotros, puede optar por buscar atencin mdica  en el consultorio de su doctor(a), en una clnica privada, en un centro de atencin urgente o en una sala de emergencias.  Si tiene una emergencia mdica, por favor llame inmediatamente al 911 o vaya a la sala de emergencias.  Nmeros de bper  - Dr. Kowalski: [phone]  - Dra. Moye: [phone]  - Dra. Stewart: [phone]  En caso de inclemencias del tiempo, por favor llame a nuestra lnea principal al [phone] para una actualizacin sobre el estado de cualquier retraso o cierre.  Consejos para la medicacin en dermatologa: Por favor, guarde las cajas en las que vienen los medicamentos de uso tpico para ayudarle a seguir las instrucciones sobre dnde y cmo usarlos. Las farmacias generalmente imprimen las instrucciones del medicamento slo en las cajas y no directamente en los tubos del medicamento.   Si su medicamento es muy caro, por favor, pngase en contacto con nuestra oficina llamando al [phone] y presione la opcin 4 o envenos un mensaje a travs de MyChart.   No podemos decirle cul ser su copago por los medicamentos por adelantado ya que esto es diferente dependiendo de la cobertura de su seguro. Sin embargo, es posible que podamos encontrar un medicamento sustituto a menor costo o llenar un formulario para que el  seguro cubra el medicamento que se considera necesario.   Si se requiere una autorizacin previa para que su compaa de seguros cubra su medicamento, por favor permtanos de 1 a 2 das hbiles para completar este proceso.  Los precios de los medicamentos varan con frecuencia dependiendo del lugar de dnde se surte la receta y alguna farmacias pueden ofrecer precios ms baratos.  El sitio web www.goodrx.com tiene cupones para medicamentos de diferentes farmacias. Los precios aqu no tienen en cuenta lo que podra costar con la ayuda del seguro (puede ser ms barato con su seguro), pero el sitio web puede darle el precio si no utiliz ningn seguro.  - Puede imprimir el cupn correspondiente y llevarlo con su receta a la farmacia.  - Tambin puede pasar por nuestra oficina durante el horario de atencin regular y recoger una tarjeta de cupones de GoodRx.  - Si necesita que su receta se enve electrnicamente a una farmacia diferente, informe a nuestra oficina a travs de MyChart de [facility]   o por telfono llamando al [phone] y presione la opcin 4.  

## 2023-02-18 NOTE — Progress Notes (Unsigned)
New Patient Visit  Subjective  Patricia Garner is a 67 y.o. female who presents for the following: Annual Exam (Hx dysplastic nevus ). The patient presents for Total-Body Skin Exam (TBSE) for skin cancer screening and mole check.  The patient has spots, moles and lesions to be evaluated, some may be new or changing and the patient has concerns that these could be cancer.  The following portions of the chart were reviewed this encounter and updated as appropriate:   Tobacco  Allergies  Meds  Problems  Med Hx  Surg Hx  Fam Hx     Review of Systems:  No other skin or systemic complaints except as noted in HPI or Assessment and Plan.  Objective  Well appearing patient in no apparent distress; mood and affect are within normal limits.  A full examination was performed including scalp, head, eyes, ears, nose, lips, neck, chest, axillae, abdomen, back, buttocks, bilateral upper extremities, bilateral lower extremities, hands, feet, fingers, toes, fingernails, and toenails. All findings within normal limits unless otherwise noted below.  L forehead x 1 Erythematous stuck-on, waxy papule or plaque  Scalp Thick white plaque of scalp.  B/L arms Reticulated hyperpigmented patches.    Assessment & Plan  Inflamed seborrheic keratosis L forehead x 1 Symptomatic, irritating, patient would like treated. RTC if not resolved in 6 weeks. Destruction of lesion - L forehead x 1 Complexity: simple   Destruction method: cryotherapy   Informed consent: discussed and consent obtained   Timeout:  patient name, date of birth, surgical site, and procedure verified Lesion destroyed using liquid nitrogen: Yes   Region frozen until ice ball extended beyond lesion: Yes   Outcome: patient tolerated procedure well with no complications   Post-procedure details: wound care instructions given    Psoriasis Scalp Counseling on psoriasis and coordination of care - no known fhx of psoriasis  psoriasis is a  chronic non-curable, but treatable genetic/hereditary disease that may have other systemic features affecting other organ systems such as joints (Psoriatic Arthritis). It is associated with an increased risk of inflammatory bowel disease, heart disease, non-alcoholic fatty liver disease, and depression.  Treatments include light and laser treatments; topical medications; and systemic medications including oral and injectables.  Start Clobetasol solution to aa's scalp QD 5d/wk.  Recommend salicylic acid or tar shampoo daily.   Topical steroids (such as triamcinolone, fluocinolone, fluocinonide, mometasone, clobetasol, halobetasol, betamethasone, hydrocortisone) can cause thinning and lightening of the skin if they are used for too long in the same area. Your physician has selected the right strength medicine for your problem and area affected on the body. Please use your medication only as directed by your physician to prevent side effects.   clobetasol (TEMOVATE) 0.05 % external solution - Scalp Apply 1 Application topically 2 (two) times daily. Apply to aa's psoriasis in the scalp QD up to 5d/wk. Avoid applying to face, groin, and axilla. Use as directed. Long-term use can cause thinning of the skin.  Melasma B/L arms Benign-appearing.  Observation.  Call clinic for new or changing lesions.  Recommend daily use of broad spectrum spf 30+ sunscreen to sun-exposed areas.  No treatment recommended. Asymptomatic.  Lentigines - Scattered tan macules - Due to sun exposure - Benign-appearing, observe - Recommend daily broad spectrum sunscreen SPF 30+ to sun-exposed areas, reapply every 2 hours as needed. - Call for any changes  Seborrheic Keratoses - Stuck-on, waxy, tan-brown papules and/or plaques  - Benign-appearing - Discussed benign etiology and prognosis. -  Observe - Call for any changes  Melanocytic Nevi - Tan-brown and/or pink-flesh-colored symmetric macules and papules - Benign  appearing on exam today - Observation - Call clinic for new or changing moles - Recommend daily use of broad spectrum spf 30+ sunscreen to sun-exposed areas.   Hemangiomas - Red papules - Discussed benign nature - Observe - Call for any changes  Actinic Damage - Chronic condition, secondary to cumulative UV/sun exposure - diffuse scaly erythematous macules with underlying dyspigmentation - Recommend daily broad spectrum sunscreen SPF 30+ to sun-exposed areas, reapply every 2 hours as needed.  - Staying in the shade or wearing long sleeves, sun glasses (UVA+UVB protection) and wide brim hats (4-inch brim around the entire circumference of the hat) are also recommended for sun protection.  - Call for new or changing lesions.  History of Dysplastic Nevus - upper back spinal - No evidence of recurrence today - Recommend regular full body skin exams - Recommend daily broad spectrum sunscreen SPF 30+ to sun-exposed areas, reapply every 2 hours as needed.  - Call if any new or changing lesions are noted between office visits  Skin cancer screening performed today.  Return in about 6 weeks (around 04/01/2023) for psoriasis and ISK follow up.  Luther Redo, CMA, am acting as scribe for Sarina Ser, MD . Documentation: I have reviewed the above documentation for accuracy and completeness, and I agree with the above.  Sarina Ser, MD

## 2023-02-20 ENCOUNTER — Inpatient Hospital Stay: Payer: PPO | Attending: Obstetrics and Gynecology | Admitting: Obstetrics and Gynecology

## 2023-02-20 VITALS — BP 133/67 | HR 77 | Temp 98.6°F | Resp 20 | Wt 261.3 lb

## 2023-02-20 DIAGNOSIS — N900 Mild vulvar dysplasia: Secondary | ICD-10-CM

## 2023-02-20 DIAGNOSIS — M810 Age-related osteoporosis without current pathological fracture: Secondary | ICD-10-CM | POA: Insufficient documentation

## 2023-02-20 DIAGNOSIS — Z780 Asymptomatic menopausal state: Secondary | ICD-10-CM | POA: Insufficient documentation

## 2023-02-20 DIAGNOSIS — Z6841 Body Mass Index (BMI) 40.0 and over, adult: Secondary | ICD-10-CM | POA: Insufficient documentation

## 2023-02-20 DIAGNOSIS — I739 Peripheral vascular disease, unspecified: Secondary | ICD-10-CM | POA: Diagnosis not present

## 2023-02-20 DIAGNOSIS — K219 Gastro-esophageal reflux disease without esophagitis: Secondary | ICD-10-CM | POA: Insufficient documentation

## 2023-02-20 DIAGNOSIS — J45909 Unspecified asthma, uncomplicated: Secondary | ICD-10-CM | POA: Insufficient documentation

## 2023-02-20 DIAGNOSIS — Z79899 Other long term (current) drug therapy: Secondary | ICD-10-CM | POA: Diagnosis not present

## 2023-02-20 DIAGNOSIS — I10 Essential (primary) hypertension: Secondary | ICD-10-CM | POA: Diagnosis not present

## 2023-02-20 DIAGNOSIS — Z7189 Other specified counseling: Secondary | ICD-10-CM

## 2023-02-20 DIAGNOSIS — N904 Leukoplakia of vulva: Secondary | ICD-10-CM | POA: Insufficient documentation

## 2023-02-20 DIAGNOSIS — L292 Pruritus vulvae: Secondary | ICD-10-CM | POA: Insufficient documentation

## 2023-02-20 DIAGNOSIS — M1990 Unspecified osteoarthritis, unspecified site: Secondary | ICD-10-CM | POA: Insufficient documentation

## 2023-02-20 DIAGNOSIS — E782 Mixed hyperlipidemia: Secondary | ICD-10-CM | POA: Insufficient documentation

## 2023-02-20 NOTE — Progress Notes (Signed)
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 female who is seen in consultation from Dr. Ardeth Perfect, PA for evaluation of VIN.   She is postmenopausal., presents today for VIN. At her annual exam she c/o  vulvar irritation which is chronic. Previously, Dr. Ammie Dalton gave her mycolog cream that helped, but sx persisted. Rx changed to lotrisone crm 4/18 with sx improvement. Sx flare with sweating. Sx improve with lotrisone cream BID for 2 wks but then recur after a few days now. She has had vulvar irritation issues and has been in a scratch/itch cycle for years. Recently she tried topical steroids but that made her symptoms worse.   She saw Dr. Lorelei Pont who did a thorough exam with biopsy. See figure below with findings.   Genitourinary Comments: EXT EXAM WITH ERYTHEMA IN PATCHES BILAT LABIA MAJORA WITH A LITTLE SKIN BREAKDOWN     Right Labia: tenderness and lesions.     Right Labia: No rash.    Left Labia: tenderness and lesions.     Left Labia: No rash.    BIOPSY:  - Koilocytic atypia, consistent with low-grade squamous intraepithelial lesion (VIN1, low-grade dysplasia)    Last Pap: 07/01/20 Results were: no abnormalities /neg HPV DNA.     Last mammogram: 08/18/19  Results were: normal--routine follow-up in 12 months There is no FH of breast cancer. There is no FH of ovarian cancer. The patient does not do self-breast exams.   Colonoscopy: colonoscopy 2017; Repeat after 5 yrs per pt report. She has appt 4/24. 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   PVD (peripheral vascular disease) ([place]) 11/06/2022   Prediabetes 11/09/2021   Iron deficiency 11/09/2021   Gastroesophageal reflux disease without esophagitis 11/09/2021   Mixed hyperlipidemia 11/09/2021   Class 3 severe obesity due  to excess calories with body mass index (BMI) of 45.0 to 49.9 in adult [facility]) 11/09/2021   Pain in joint of left knee 08/17/2021   Osteoporosis 04/12/2018   Essential (primary) hypertension [id number]   Uncomplicated asthma [id number]   Barrett's esophagus without dysplasia 06/13/2015   Osteoarthritis 06/13/2015    Past Medical History: Past Medical History:  Diagnosis Date   Allergy    Arthritis    Asthma    Barrett esophagus    Chicken pox    Chronic vaginitis    Dysplastic nevus 08/30/2016   upper back spinal, mild   GERD (gastroesophageal reflux disease)    History of colon polyps    Hyperlipidemia    Hypertension    Osteoporosis    Vitamin D deficiency     Past Surgical History: Past Surgical History:  Procedure Laterality Date   CHOLECYSTECTOMY  1983   COLONOSCOPY WITH PROPOFOL N/A 08/28/2016   Procedure: COLONOSCOPY WITH PROPOFOL;  Surgeon: Lollie Sails, MD;  Location[facility] ENDOSCOPY;  Service: Endoscopy;  Laterality: N/A;   ESOPHAGOGASTRODUODENOSCOPY (EGD) WITH PROPOFOL N/A 08/28/2016   Procedure: ESOPHAGOGASTRODUODENOSCOPY (EGD) WITH PROPOFOL;  Surgeon: Lollie Sails, MD;  Location: [facility] ENDOSCOPY;  Service: Endoscopy;  Laterality: N/A;   ESOPHAGOGASTRODUODENOSCOPY (EGD) WITH PROPOFOL N/A 09/15/2019   Procedure: ESOPHAGOGASTRODUODENOSCOPY (EGD) WITH PROPOFOL;  Surgeon: Lollie Sails, MD;  Location[facility] ENDOSCOPY;  Service: Endoscopy;  Laterality: N/A;   TONSILLECTOMY  1969    Past Gynecologic History:  As per HPI  OB History:  OB History  Gravida Para Term Preterm AB Living  3 2 2   1 2  $ SAB IAB Ectopic Multiple Live Births  1       2    # Outcome Date GA Lbr Len/2nd Weight Sex Delivery Anes PTL Lv  3 Term 01/04/81 [redacted]w[redacted]d 8 lb 4.8 oz (3.765 kg) F Vag-Spont     2 Term 08/20/79 [id number] lb 11.2 oz (3.039 kg) F Vag-Spont     1 SAB             Family History: Family History  Problem Relation Age of Onset   Arthritis Mother    Heart disease  Mother    Stroke Mother    Hypertension Mother    Alcohol abuse Father    Alcohol abuse Sister    Mental illness Sister    Stomach cancer Sister [id number] Alcohol abuse Brother    Drug abuse Brother    Arthritis Maternal Grandmother    Stroke Maternal Grandmother    Colon cancer Maternal Grandfather [id number]  Social History: Social History   Socioeconomic History   Marital status: Married    Spouse name: Not on file   Number of children: Not on file   Years of education: Not on file   Highest education level: Not on file  Occupational History   Not on file  Tobacco Use   Smoking status: Never   Smokeless tobacco: Never  Vaping Use   Vaping Use: Never used  Substance and Sexual Activity   Alcohol use: No    Alcohol/week: 0.0 standard drinks of alcohol   Drug use: Never   Sexual activity: Yes    Birth control/protection: Post-menopausal  Other Topics Concern   Not on file  Social History Narrative   Not on file   Social Determinants of Health   Financial Resource Strain: Not on file  Food Insecurity: Not on file  Transportation Needs: Not on file  Physical Activity: Not on file  Stress: Not on file  Social Connections: Not on file  Intimate Partner Violence: Not on file    Allergies: Allergies  Allergen Reactions   Bactrim [Sulfamethoxazole-Trimethoprim] Nausea Only   Codeine Nausea And Vomiting   Hydrocodone Hives   Lipitor [Atorvastatin] Other (See Comments)    Myalgia    Medrol [Methylprednisolone] Nausea Only    Current Medications: Current Outpatient Medications  Medication Sig Dispense Refill   albuterol (VENTOLIN HFA) 108 (90 Base) MCG/ACT inhaler INHALE 1 TO 2 PUFFS BY MOUTH EVERY 6 HOURS AS NEEDED FOR WHEEZING OR SHORTNESS OF BREATH 8 g 5   Calcium-Vitamin D 600-200 MG-UNIT tablet Take by mouth.     clobetasol (TEMOVATE) 0.05 % external solution Apply 1 Application topically 2 (two) times daily. Apply to aa's psoriasis in the scalp QD up to 5d/wk.  Avoid applying to face, groin, and axilla. Use as directed. Long-term use can cause thinning of the skin. 50 mL 1   ferrous sulfate 325 (65 FE) MG EC tablet Take 325 mg by mouth as needed.     lisinopril-hydrochlorothiazide (ZESTORETIC) 20-25 MG tablet TAKE 1 TABLET BY MOUTH ONCE DAILY (OFFICE  VISIT  REQUIRED  FOR  FURTHER  REFILLS) 90 tablet 1   [place]a 500 MG CAPS Take by mouth.     Multiple Vitamins-Minerals (MULTIVITAMIN WITH MINERALS) tablet Take by mouth.     pantoprazole (PROTONIX) 40 MG tablet Take 1 tablet (  40 mg total) by mouth daily. 90 tablet 1   clotrimazole-betamethasone (LOTRISONE) cream APPLY TOPICALLY TWICE DAILY FOR TWO WEEKS. THEN USE TWICE DAILY AS NEEDED FOR SYMPTOMS (Patient not taking: Reported on 01/28/2023) 45 g 0   No current facility-administered medications for this visit.    Review of Systems General: negative for fevers, changes in weight or night sweats Skin: negative for changes in moles or sores or rash Eyes: negative for changes in vision HEENT: negative for change in hearing, tinnitus, voice changes Pulmonary: negative for dyspnea, orthopnea, productive cough, wheezing Cardiac: negative for palpitations, pain Gastrointestinal: negative for nausea, vomiting, constipation, diarrhea, hematemesis, hematochezia Genitourinary/Sexual: negative for dysuria, retention, hematuria, incontinence Ob/Gyn:  as per HPI Musculoskeletal: negative for pain, joint pain, back pain Hematology: negative for easy bruising, abnormal bleeding Neurologic/Psych: negative for headaches, seizures, paralysis, weakness, numbness   Objective:  Physical Examination:  BP 133/67   Pulse 77   Temp 98.6 F (37 C)   Resp 20   Wt 261 lb 4.8 oz (118.5 kg)   SpO2 100%   BMI 46.29 kg/m    ECOG Performance Status: 0 - Asymptomatic  GENERAL: Patient is a well appearing female in no acute distress HEENT:  PERRL, soft, no thyroid without masses.  NODES:  No cervical, supraclavicular,  axillary, or inguinal lymphadenopathy palpated.  LUNGS:  Clear to auscultation bilaterally.  No wheezes or rhonchi. HEART:  Regular rate and rhythm.  ABDOMEN:  Soft, nontender, nondistended.  EXTREMITIES:  No peripheral edema.   NEURO:  Nonfocal. Well oriented.  Appropriate affect.  Pelvic chaperoned by nurse CMA Vulva: atrophic. She states she has itching all over. Bilaterally there are areas that appear c/w lichen schlerosis which are well demarcated and different in appearance compared to the rest of the vulva and site of the biopsy at 3 oclock.  Vagina: normal vagina  Cervix: unable to visualize, exam challenging and she is uncomfortable.  Bimanual: limited.No masses appreciated  RV: Deferred   Lab Review Labs on site today:  Lab Results  Component Value Date   WBC 7.8 11/06/2022   HGB 12.4 11/06/2022   HCT 36.8 11/06/2022   MCV 93.2 11/06/2022   PLT 341 11/06/2022   Lab Results  Component Value Date   HGBA1C 6.3 (H) 11/06/2022   Lab Results  Component Value Date   CREATININE 0.66 11/06/2022   BUN 16 11/06/2022   NA 141 11/06/2022   K 4.2 11/06/2022   CL 100 11/06/2022   CO2 30 11/06/2022     Radiologic Imaging: N/A    Assessment:  Patricia Garner is a 67 y.o. 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   Medical co-morbidities complicating care: HTN and obesity.  Plan:   Problem List Items Addressed This Visit       Musculoskeletal and Integument   VIN I (vulvar intraepithelial neoplasia I) - Primary   Other Visit Diagnoses     Counseling and coordination of care           We discussed options for management and recommended examine under anesthesia, colposcopy, multiple vulvar biopsies for mapping,and vulvar ablation of the known biopsied area consistent with VIN 1.  Best vulvar hygiene practices shared with her today.  In addition we discussed the importance of breaking the scratch itch scratch  cycle.  Once we have her biopsies we can determine subsequent management.  The risks of surgery were discussed in detail and she understands these  risk to include but not limited to: infection, bleeding, anesthesia risk, possible death, possible need for further surgery, medical complications such as heart attack, stroke, pulmonary edema, abnormal heart rhythm, and pneumonia. The patient will receive DVT and antibiotic prophylaxis as indicated. She voiced a clear understanding. She had the opportunity to ask questions and electronic informed consent was obtained today.      Periop Anticoagulation    VTE Risk Plan: Moderate Risk: One pre-op dose UFH AND SCDs   She will have preop anesthesia appointment. Labs/CXR/EKG per anesthesia discretion.    Suggested return to clinic in 3-4 weeks after surgery.   A total of 60 minutes were spent with the patient/family today; >50% was spent in education, counseling and coordination of care for VIN and vulvar irritation.   The patient's diagnosis, an outline of the further diagnostic and laboratory studies which will be required, the recommendation, and alternatives were discussed.  All questions were answered to the patient's satisfaction.  I personally had a face to face interaction and evaluated the patient. I have reviewed her history and available records and have performed the physical exam.  I have discussed the case with the patient.   Maloree Uplinger Gaetana Michaelis, MD      CC:  Ardeth Perfect, [place]

## 2023-02-20 NOTE — Progress Notes (Signed)
Met with patient to review pre-operative teaching for planned surgery scheduled for March 27, 2023. Pre-admit phone screen appointment discussed. Teaching included but not limited to labs, bowel prep and dietary restrictions, surgical location, diligent peri care, guidelines to prevent constipation, and importance of early ambulation post operatively and follow-up care. Written instructions provided for post-op care, contact information for questions and concerns provided to patient. Patient able to repeat instructions to nursing staff. No further questions at this time. Patient agrees with plan to proceed with scheduled procedure.

## 2023-02-20 NOTE — Patient Instructions (Addendum)
Surgery will be scheduled for March 27, 2023. We will arrange a pre-admit testing appointment visit. This will be a telephone visit. We will call you once scheduled.     Vulvectomy, Wide Local Excision, or Laser Ablation: Surgical Information  The vulva is the external female genitalia, outside and around the vagina and pubic bone. It consists of:  The skin on, and in front of, the pubic bone.  The clitoris.  The labia majora (large lips) on the outside of the vagina.  The labia minora (small lips) around the opening of the vagina.  The opening and the skin in and around the vagina.   A vulvectomy or wide local excision of the vulva is the removal of the tissue of the vulva, which sometimes includes removal of the lymph nodes and tissue in the groin areas.  After surgery, a doctor will look at the tissue under a microscope. He or she will check it for abnormal cells.    Laser ablation is a procedure used to treat precancerous skin changes. The top layers of skin are removed with the laser. The wound is then covered with ointment. A scab will form over the area. The wound may take 3 to 6 week s to heal, depending on the size of the area treated. Good wound care may help the scar fade with time.  Most women go home 1 to 4 hours after surgery. You may need to stay overnight if you had lymph nodes removed. You will probably be able to return to your normal routine in 1 or 2 weeks.    Incision Care after surgery Do not douche or use tampons  Do not have sexual intercourse until your caregiver gives you permission.  After moving your bowels or urinating use a bottle filled with warm water (peri-bottle) to clean the area. Gently wipe from front to back or pat the skin dry. You may use a blow dryer on the cool setting to help dry the skin.  Wear loose cotton underwear. Do not wear tight pants.  A sitz bath will help keep your perineal area clean, reduce swelling, and provide comfort.  You may notice  that your urine stream is at a different angle, and may spray. Using a plastic funnel may help decrease spray  Avoid exercises, such as running or biking for 3 weeks or as directed.  If your doctor has removed lymph nodes from your groin area, there may be an increase in swelling of your legs and feet. You can help prevent swelling by doing the following:    Elevate your legs while sitting or lying down.    Avoid standing in one place for long periods of time.    Avoid salt in your diet. It can cause fluid retention and swelling.    Do not cross your legs, especially when sitting.   Follow-up care is a key part of your treatment and safety. Be sure to make and go to all appointments, and call your doctor if you are having problems. It's also a good idea to know your test results and keep a list of the me dicines you take.             Please consider healthy vulval hygiene practices as noted below.     Avoid   Pantyhose   Synthetic underwear  Jeans and other tight pants  Swimsuits, leotards, thongs, lycra garments  Pantyliners  Scented soaps or shampoos  Bubble bath   Scented detergents  Washcloths  Feminine sprays, douches, powders   Dyed toilet articles   Hair dryers to dry vulva skin without contact       Substitute   Stockings with a garter belt  Thigh-high or knee-high stockings  Cotton underwear or no underwear  Loose pants, skirts, dresses  Loose-fitting cotton garments  Tampons or cotton pads  Fragrance-free pH neutral soap (eg, Basis, Neutrogena, Dove soap)  Tub baths in the morning and at night without additives and at a comfortable temperature Unscented detergents Use fingertips for washing; pat dry, don't rub dry  These are not necessary products and can be omitted from personal practices Toilet articles without dyes Dry vulva by gentle patting

## 2023-02-20 NOTE — H&P (Signed)
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 female who is seen in consultation from Dr. Ardeth Perfect, PA for evaluation of VIN.   She is postmenopausal., presents today for VIN. At her annual exam she c/o  vulvar irritation which is chronic. Previously, Dr. Ammie Dalton gave her mycolog cream that helped, but sx persisted. Rx changed to lotrisone crm 4/18 with sx improvement. Sx flare with sweating. Sx improve with lotrisone cream BID for 2 wks but then recur after a few days now. She has had vulvar irritation issues and has been in a scratch/itch cycle for years. Recently she tried topical steroids but that made her symptoms worse.   She saw Dr. Lorelei Pont who did a thorough exam with biopsy. See figure below with findings.   Genitourinary Comments: EXT EXAM WITH ERYTHEMA IN PATCHES BILAT LABIA MAJORA WITH A LITTLE SKIN BREAKDOWN     Right Labia: tenderness and lesions.     Right Labia: No rash.    Left Labia: tenderness and lesions.     Left Labia: No rash.    BIOPSY:  - Koilocytic atypia, consistent with low-grade squamous intraepithelial lesion (VIN1, low-grade dysplasia)    Last Pap: 07/01/20 Results were: no abnormalities /neg HPV DNA.     Last mammogram: 08/18/19  Results were: normal--routine follow-up in 12 months There is no FH of breast cancer. There is no FH of ovarian cancer. The patient does not do self-breast exams.   Colonoscopy: colonoscopy 2017; Repeat after 5 yrs per pt report. She has appt 4/24. 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   PVD (peripheral vascular disease) ([place]) 11/06/2022   Prediabetes 11/09/2021   Iron deficiency 11/09/2021   Gastroesophageal reflux disease without esophagitis 11/09/2021   Mixed hyperlipidemia 11/09/2021   Class 3 severe obesity due  to excess calories with body mass index (BMI) of 45.0 to 49.9 in adult [facility]) 11/09/2021   Pain in joint of left knee 08/17/2021   Osteoporosis 04/12/2018   Essential (primary) hypertension [id number]   Uncomplicated asthma [id number]   Barrett's esophagus without dysplasia 06/13/2015   Osteoarthritis 06/13/2015    Past Medical History: Past Medical History:  Diagnosis Date   Allergy    Arthritis    Asthma    Barrett esophagus    Chicken pox    Chronic vaginitis    Dysplastic nevus 08/30/2016   upper back spinal, mild   GERD (gastroesophageal reflux disease)    History of colon polyps    Hyperlipidemia    Hypertension    Osteoporosis    Vitamin D deficiency     Past Surgical History: Past Surgical History:  Procedure Laterality Date   CHOLECYSTECTOMY  1983   COLONOSCOPY WITH PROPOFOL N/A 08/28/2016   Procedure: COLONOSCOPY WITH PROPOFOL;  Surgeon: Lollie Sails, MD;  Location[facility] ENDOSCOPY;  Service: Endoscopy;  Laterality: N/A;   ESOPHAGOGASTRODUODENOSCOPY (EGD) WITH PROPOFOL N/A 08/28/2016   Procedure: ESOPHAGOGASTRODUODENOSCOPY (EGD) WITH PROPOFOL;  Surgeon: Lollie Sails, MD;  Location: [facility] ENDOSCOPY;  Service: Endoscopy;  Laterality: N/A;   ESOPHAGOGASTRODUODENOSCOPY (EGD) WITH PROPOFOL N/A 09/15/2019   Procedure: ESOPHAGOGASTRODUODENOSCOPY (EGD) WITH PROPOFOL;  Surgeon: Lollie Sails, MD;  Location: [facility] ENDOSCOPY;  Service: Endoscopy;  Laterality: N/A;   TONSILLECTOMY  1969    Past Gynecologic History:  As per HPI  OB History:  OB History  Gravida Para Term Preterm AB Living  3 2 2   1 2  $ SAB IAB Ectopic Multiple Live Births  1       2    # Outcome Date GA Lbr Len/2nd Weight Sex Delivery Anes PTL Lv  3 Term 01/04/81 [redacted]w[redacted]d 8 lb 4.8 oz (3.765 kg) F Vag-Spont     2 Term 08/20/79 [id number] lb 11.2 oz (3.039 kg) F Vag-Spont     1 SAB             Family History: Family History  Problem Relation Age of Onset   Arthritis Mother    Heart disease  Mother    Stroke Mother    Hypertension Mother    Alcohol abuse Father    Alcohol abuse Sister    Mental illness Sister    Stomach cancer Sister [id number] Alcohol abuse Brother    Drug abuse Brother    Arthritis Maternal Grandmother    Stroke Maternal Grandmother    Colon cancer Maternal Grandfather [id number]  Social History: Social History   Socioeconomic History   Marital status: Married    Spouse name: Not on file   Number of children: Not on file   Years of education: Not on file   Highest education level: Not on file  Occupational History   Not on file  Tobacco Use   Smoking status: Never   Smokeless tobacco: Never  Vaping Use   Vaping Use: Never used  Substance and Sexual Activity   Alcohol use: No    Alcohol/week: 0.0 standard drinks of alcohol   Drug use: Never   Sexual activity: Yes    Birth control/protection: Post-menopausal  Other Topics Concern   Not on file  Social History Narrative   Not on file   Social Determinants of Health   Financial Resource Strain: Not on file  Food Insecurity: Not on file  Transportation Needs: Not on file  Physical Activity: Not on file  Stress: Not on file  Social Connections: Not on file  Intimate Partner Violence: Not on file    Allergies: Allergies  Allergen Reactions   Bactrim [Sulfamethoxazole-Trimethoprim] Nausea Only   Codeine Nausea And Vomiting   Hydrocodone Hives   Lipitor [Atorvastatin] Other (See Comments)    Myalgia    Medrol [Methylprednisolone] Nausea Only    Current Medications: Current Outpatient Medications  Medication Sig Dispense Refill   albuterol (VENTOLIN HFA) 108 (90 Base) MCG/ACT inhaler INHALE 1 TO 2 PUFFS BY MOUTH EVERY 6 HOURS AS NEEDED FOR WHEEZING OR SHORTNESS OF BREATH 8 g 5   Calcium-Vitamin D 600-200 MG-UNIT tablet Take by mouth.     clobetasol (TEMOVATE) 0.05 % external solution Apply 1 Application topically 2 (two) times daily. Apply to aa's psoriasis in the scalp QD up to 5d/wk.  Avoid applying to face, groin, and axilla. Use as directed. Long-term use can cause thinning of the skin. 50 mL 1   ferrous sulfate 325 (65 FE) MG EC tablet Take 325 mg by mouth as needed.     lisinopril-hydrochlorothiazide (ZESTORETIC) 20-25 MG tablet TAKE 1 TABLET BY MOUTH ONCE DAILY (OFFICE  VISIT  REQUIRED  FOR  FURTHER  REFILLS) 90 tablet 1   Moringa 500 MG CAPS Take by mouth.     Multiple Vitamins-Minerals (MULTIVITAMIN WITH MINERALS) tablet Take by mouth.     pantoprazole (PROTONIX) 40 MG tablet Take 1 tablet (  40 mg total) by mouth daily. 90 tablet 1   clotrimazole-betamethasone (LOTRISONE) cream APPLY TOPICALLY TWICE DAILY FOR TWO WEEKS. THEN USE TWICE DAILY AS NEEDED FOR SYMPTOMS (Patient not taking: Reported on 01/28/2023) 45 g 0   No current facility-administered medications for this visit.    Review of Systems General: negative for fevers, changes in weight or night sweats Skin: negative for changes in moles or sores or rash Eyes: negative for changes in vision HEENT: negative for change in hearing, tinnitus, voice changes Pulmonary: negative for dyspnea, orthopnea, productive cough, wheezing Cardiac: negative for palpitations, pain Gastrointestinal: negative for nausea, vomiting, constipation, diarrhea, hematemesis, hematochezia Genitourinary/Sexual: negative for dysuria, retention, hematuria, incontinence Ob/Gyn:  as per HPI Musculoskeletal: negative for pain, joint pain, back pain Hematology: negative for easy bruising, abnormal bleeding Neurologic/Psych: negative for headaches, seizures, paralysis, weakness, numbness   Objective:  Physical Examination:  BP 133/67   Pulse 77   Temp 98.6 F (37 C)   Resp 20   Wt 261 lb 4.8 oz (118.5 kg)   SpO2 100%   BMI 46.29 kg/m    ECOG Performance Status: 0 - Asymptomatic  GENERAL: Patient is a well appearing female in no acute distress HEENT:  PERRL, soft, no thyroid without masses.  NODES:  No cervical, supraclavicular,  axillary, or inguinal lymphadenopathy palpated.  LUNGS:  Clear to auscultation bilaterally.  No wheezes or rhonchi. HEART:  Regular rate and rhythm.  ABDOMEN:  Soft, nontender, nondistended.  EXTREMITIES:  No peripheral edema.   NEURO:  Nonfocal. Well oriented.  Appropriate affect.  Pelvic chaperoned by nurse CMA Vulva: atrophic. She states she has itching all over. Bilaterally there are areas that appear c/w lichen schlerosis which are well demarcated and different in appearance compared to the rest of the vulva and site of the biopsy at 3 oclock.  Vagina: normal vagina  Cervix: unable to visualize, exam challenging and she is uncomfortable.  Bimanual: limited.No masses appreciated  RV: Deferred   Lab Review Labs on site today:  Lab Results  Component Value Date   WBC 7.8 11/06/2022   HGB 12.4 11/06/2022   HCT 36.8 11/06/2022   MCV 93.2 11/06/2022   PLT 341 11/06/2022   Lab Results  Component Value Date   HGBA1C 6.3 (H) 11/06/2022   Lab Results  Component Value Date   CREATININE 0.66 11/06/2022   BUN 16 11/06/2022   NA 141 11/06/2022   K 4.2 11/06/2022   CL 100 11/06/2022   CO2 30 11/06/2022     Radiologic Imaging: N/A    Assessment:  Patricia Garner is a 67 y.o. 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   Medical co-morbidities complicating care: HTN and obesity.  Plan:   Problem List Items Addressed This Visit       Musculoskeletal and Integument   VIN I (vulvar intraepithelial neoplasia I) - Primary   Other Visit Diagnoses     Counseling and coordination of care           We discussed options for management and recommended examine under anesthesia, colposcopy, multiple vulvar biopsies for mapping,and vulvar ablation of the known biopsied area consistent with VIN 1.  Best vulvar hygiene practices shared with her today.  In addition we discussed the importance of breaking the scratch itch scratch  cycle.  Once we have her biopsies we can determine subsequent management.  The risks of surgery were discussed in detail and she understands these  risk to include but not limited to: infection, bleeding, anesthesia risk, possible death, possible need for further surgery, medical complications such as heart attack, stroke, pulmonary edema, abnormal heart rhythm, and pneumonia. The patient will receive DVT and antibiotic prophylaxis as indicated. She voiced a clear understanding. She had the opportunity to ask questions and electronic informed consent was obtained today.      Periop Anticoagulation    VTE Risk Plan: Moderate Risk: One pre-op dose UFH AND SCDs   She will have preop anesthesia appointment. Labs/CXR/EKG per anesthesia discretion.    Suggested return to clinic in 3-4 weeks after surgery.   A total of 60 minutes were spent with the patient/family today; >50% was spent in education, counseling and coordination of care for VIN and vulvar irritation.   The patient's diagnosis, an outline of the further diagnostic and laboratory studies which will be required, the recommendation, and alternatives were discussed.  All questions were answered to the patient's satisfaction.  I personally had a face to face interaction and evaluated the patient. I have reviewed her history and available records and have performed the physical exam.  I have discussed the case with the patient.   Patricia Elrod Gaetana Michaelis, MD      CC:  Ardeth Perfect, [place]

## 2023-02-21 ENCOUNTER — Encounter: Payer: Self-pay | Admitting: Dermatology

## 2023-02-21 LAB — SURGICAL PATHOLOGY

## 2023-02-25 DIAGNOSIS — M5136 Other intervertebral disc degeneration, lumbar region: Secondary | ICD-10-CM | POA: Diagnosis not present

## 2023-02-25 DIAGNOSIS — M9903 Segmental and somatic dysfunction of lumbar region: Secondary | ICD-10-CM | POA: Diagnosis not present

## 2023-02-25 DIAGNOSIS — M9905 Segmental and somatic dysfunction of pelvic region: Secondary | ICD-10-CM | POA: Diagnosis not present

## 2023-02-25 DIAGNOSIS — M62830 Muscle spasm of back: Secondary | ICD-10-CM | POA: Diagnosis not present

## 2023-03-18 ENCOUNTER — Encounter
Admission: RE | Admit: 2023-03-18 | Discharge: 2023-03-18 | Disposition: A | Discharge status: Home / Self Care | Payer: PPO | Source: Ambulatory Visit | Attending: Obstetrics and Gynecology | Admitting: Obstetrics and Gynecology

## 2023-03-18 ENCOUNTER — Other Ambulatory Visit: Payer: Self-pay

## 2023-03-18 DIAGNOSIS — N900 Mild vulvar dysplasia: Secondary | ICD-10-CM

## 2023-03-18 DIAGNOSIS — I10 Essential (primary) hypertension: Secondary | ICD-10-CM

## 2023-03-18 HISTORY — DX: Prediabetes: R73.03

## 2023-03-18 HISTORY — DX: Mild vulvar dysplasia: N90.0

## 2023-03-18 NOTE — Patient Instructions (Addendum)
Your procedure is scheduled on: 03/27/2023  Report to the Registration Desk on the 1st floor of the [facility]. To find out your arrival time, please call [phone] between 1PM - 3PM on: 03/26/2023  If your arrival time is 6:00 am, do not arrive before that time as the [place] entrance doors do not open until 6:00 am.  REMEMBER: Instructions that are not followed completely may result in serious medical risk, up to and including death; or upon the discretion of your surgeon and anesthesiologist your surgery may need to be rescheduled.  Do not eat food after midnight the night before surgery.  No gum chewing or hard candies.  You may however, drink CLEAR liquids up to 2 hours before you are scheduled to arrive for your surgery. Do not drink anything within 2 hours of your scheduled arrival time.  Clear liquids include: - water  - apple juice without pulp - gatorade (not RED colors) - black coffee or tea (Do NOT add milk or creamers to the coffee or tea) Do NOT drink anything that is not on this list.  In addition, your doctor has ordered for you to drink the provided:  Ensure Pre-Surgery Clear Carbohydrate Drink   Drinking this carbohydrate drink up to two hours before surgery helps to reduce insulin resistance and improve patient outcomes. Please complete drinking 2 hours before scheduled arrival time.     Continue taking prescription medications as prescribed however on day of surgery take the Pantoprazole only. One at night before bedtime and in the am.   One week prior to surgery: Stop Anti-inflammatories (NSAIDS) such as Advil, Aleve, Ibuprofen, Motrin, Naproxen, Naprosyn and Aspirin based products such as Excedrin, Goody's Powder, BC Powder. Stop ANY OVER THE COUNTER supplements until after surgery like moringa, ferrous sulfate, vit D. You may however, continue to take Tylenol if needed for pain up until the day of surgery.   No Alcohol for 24 hours before or after  surgery.  No Smoking including e-cigarettes for 24 hours before surgery.  No chewable tobacco products for at least 6 hours before surgery.  No nicotine patches on the day of surgery.  Do not use any "recreational" drugs for at least a week (preferably 2 weeks) before your surgery.  Please be advised that the combination of cocaine and anesthesia may have negative outcomes, up to and including death. If you test positive for cocaine, your surgery will be cancelled.  On the morning of surgery brush your teeth with toothpaste and water, you may rinse your mouth with mouthwash if you wish. Do not swallow any toothpaste or mouthwash.   You may shower on day of surgery.   Do not wear jewelry, make-up, hairpins, clips or nail polish.  Do not wear lotions, powders, or perfumes.   Do not shave body hair from the neck down 48 hours before surgery.  Contact lenses, hearing aids and dentures may not be worn into surgery.  Do not bring valuables to the hospital. [facility] is not responsible for any missing/lost belongings or valuables.    Notify your doctor if there is any change in your medical condition (cold, fever, infection).  Wear comfortable clothing (specific to your surgery type) to the hospital.  After surgery, you can help prevent lung complications by doing breathing exercises.  Take deep breaths and cough every 1-2 hours. Your doctor may order a device called an Incentive Spirometer to help you take deep breaths.   If you are being  discharged the day of surgery, you will not be allowed to drive home. You will need a responsible individual to drive you home and stay with you for 24 hours after surgery.    Please call the [place] Dept. at [phone] if you have any questions about these instructions.  Surgery Visitation Policy:  Patients undergoing a surgery or procedure may have two family members or support persons with them as long as the person is not  COVID-19 positive or experiencing its symptoms.   .Due to an increase in RSV and influenza rates and associated hospitalizations, children ages 13 and under will not be able to visit patients in [place]. Masks continue to be strongly recommended.

## 2023-03-20 ENCOUNTER — Encounter
Admission: RE | Admit: 2023-03-20 | Discharge: 2023-03-20 | Disposition: A | Discharge status: Home / Self Care | Payer: PPO | Source: Ambulatory Visit | Attending: Obstetrics and Gynecology | Admitting: Obstetrics and Gynecology

## 2023-03-20 ENCOUNTER — Encounter: Payer: Self-pay | Admitting: Urgent Care

## 2023-03-20 ENCOUNTER — Ambulatory Visit
Admission: RE | Admit: 2023-03-20 | Discharge: 2023-03-20 | Disposition: A | Discharge status: Home / Self Care | Payer: PPO | Source: Ambulatory Visit | Attending: Internal Medicine | Admitting: Internal Medicine

## 2023-03-20 DIAGNOSIS — M8589 Other specified disorders of bone density and structure, multiple sites: Secondary | ICD-10-CM | POA: Diagnosis not present

## 2023-03-20 DIAGNOSIS — Z780 Asymptomatic menopausal state: Secondary | ICD-10-CM | POA: Diagnosis not present

## 2023-03-20 DIAGNOSIS — Z1231 Encounter for screening mammogram for malignant neoplasm of breast: Secondary | ICD-10-CM | POA: Diagnosis not present

## 2023-03-20 DIAGNOSIS — I10 Essential (primary) hypertension: Secondary | ICD-10-CM

## 2023-03-20 DIAGNOSIS — M9903 Segmental and somatic dysfunction of lumbar region: Secondary | ICD-10-CM | POA: Diagnosis not present

## 2023-03-20 DIAGNOSIS — N900 Mild vulvar dysplasia: Secondary | ICD-10-CM

## 2023-03-20 DIAGNOSIS — Z01812 Encounter for preprocedural laboratory examination: Secondary | ICD-10-CM

## 2023-03-20 DIAGNOSIS — M5136 Other intervertebral disc degeneration, lumbar region: Secondary | ICD-10-CM | POA: Diagnosis not present

## 2023-03-20 DIAGNOSIS — M62830 Muscle spasm of back: Secondary | ICD-10-CM | POA: Diagnosis not present

## 2023-03-20 DIAGNOSIS — M9905 Segmental and somatic dysfunction of pelvic region: Secondary | ICD-10-CM | POA: Diagnosis not present

## 2023-03-20 LAB — TYPE AND SCREEN
ABO/RH(D): O POS
Antibody Screen: NEGATIVE

## 2023-03-25 ENCOUNTER — Ambulatory Visit: Payer: PPO | Admitting: Dermatology

## 2023-03-25 VITALS — BP 163/84 | HR 82

## 2023-03-25 DIAGNOSIS — M9905 Segmental and somatic dysfunction of pelvic region: Secondary | ICD-10-CM | POA: Diagnosis not present

## 2023-03-25 DIAGNOSIS — Z79899 Other long term (current) drug therapy: Secondary | ICD-10-CM

## 2023-03-25 DIAGNOSIS — M9903 Segmental and somatic dysfunction of lumbar region: Secondary | ICD-10-CM | POA: Diagnosis not present

## 2023-03-25 DIAGNOSIS — L821 Other seborrheic keratosis: Secondary | ICD-10-CM | POA: Diagnosis not present

## 2023-03-25 DIAGNOSIS — M5136 Other intervertebral disc degeneration, lumbar region: Secondary | ICD-10-CM | POA: Diagnosis not present

## 2023-03-25 DIAGNOSIS — L820 Inflamed seborrheic keratosis: Secondary | ICD-10-CM | POA: Diagnosis not present

## 2023-03-25 DIAGNOSIS — M62830 Muscle spasm of back: Secondary | ICD-10-CM | POA: Diagnosis not present

## 2023-03-25 DIAGNOSIS — L409 Psoriasis, unspecified: Secondary | ICD-10-CM | POA: Diagnosis not present

## 2023-03-25 NOTE — Patient Instructions (Signed)
Due to recent changes in healthcare laws, you may see results of your pathology and/or laboratory studies on MyChart before the doctors have had a chance to review them. We understand that in some cases there may be results that are confusing or concerning to you. Please understand that not all results are received at the same time and often the doctors may need to interpret multiple results in order to provide you with the best plan of care or course of treatment. Therefore, we ask that you please give us 2 business days to thoroughly review all your results before contacting the office for clarification. Should we see a critical lab result, you will be contacted sooner.   If You Need Anything After Your Visit  If you have any questions or concerns for your doctor, please call our main line at [phone] and press option 4 to reach your doctor's medical assistant. If no one answers, please leave a voicemail as directed and we will return your call as soon as possible. Messages left after 4 pm will be answered the following business day.   You may also send us a message via MyChart. We typically respond to MyChart messages within 1-2 business days.  For prescription refills, please ask your pharmacy to contact our office. Our fax number is [phone].  If you have an urgent issue when the clinic is closed that cannot wait until the next business day, you can page your doctor at the number below.    Please note that while we do our best to be available for urgent issues outside of office hours, we are not available 24/7.   If you have an urgent issue and are unable to reach us, you may choose to seek medical care at your doctor's office, retail clinic, urgent care center, or emergency room.  If you have a medical emergency, please immediately call 911 or go to the emergency department.  Pager Numbers  - Dr. Kowalski: [phone]  - Dr. Moye: [phone]  - Dr. Stewart:  [phone]  In the event of inclement weather, please call our main line at [phone] for an update on the status of any delays or closures.  Dermatology Medication Tips: Please keep the boxes that topical medications come in in order to help keep track of the instructions about where and how to use these. Pharmacies typically print the medication instructions only on the boxes and not directly on the medication tubes.   If your medication is too expensive, please contact our office at [phone] option 4 or send us a message through MyChart.   We are unable to tell what your co-pay for medications will be in advance as this is different depending on your insurance coverage. However, we may be able to find a substitute medication at lower cost or fill out paperwork to get insurance to cover a needed medication.   If a prior authorization is required to get your medication covered by your insurance company, please allow us 1-2 business days to complete this process.  Drug prices often vary depending on where the prescription is filled and some pharmacies may offer cheaper prices.  The website www.goodrx.com contains coupons for medications through different pharmacies. The prices here do not account for what the cost may be with help from insurance (it may be cheaper with your insurance), but the website can give you the price if you did not use any insurance.  - You can print the associated coupon and take it with   your prescription to the pharmacy.  - You may also stop by our office during regular business hours and pick up a GoodRx coupon card.  - If you need your prescription sent electronically to a different pharmacy, notify our office through [facility] MyChart or by phone at [phone] option 4.     Si Usted Necesita Algo Despus de Su Visita  Tambin puede enviarnos un mensaje a travs de MyChart. Por lo general respondemos a los mensajes de MyChart en el transcurso de 1 a 2  das hbiles.  Para renovar recetas, por favor pida a su farmacia que se ponga en contacto con nuestra oficina. Nuestro nmero de fax es el [phone].  Si tiene un asunto urgente cuando la clnica est cerrada y que no puede esperar hasta el siguiente da hbil, puede llamar/localizar a su doctor(a) al nmero que aparece a continuacin.   Por favor, tenga en cuenta que aunque hacemos todo lo posible para estar disponibles para asuntos urgentes fuera del horario de oficina, no estamos disponibles las 24 horas del da, los 7 das de la semana.   Si tiene un problema urgente y no puede comunicarse con nosotros, puede optar por buscar atencin mdica  en el consultorio de su doctor(a), en una clnica privada, en un centro de atencin urgente o en una sala de emergencias.  Si tiene una emergencia mdica, por favor llame inmediatamente al 911 o vaya a la sala de emergencias.  Nmeros de bper  - Dr. Kowalski: [phone]  - Dra. Moye: [phone]  - Dra. Stewart: [phone]  En caso de inclemencias del tiempo, por favor llame a nuestra lnea principal al [phone] para una actualizacin sobre el estado de cualquier retraso o cierre.  Consejos para la medicacin en dermatologa: Por favor, guarde las cajas en las que vienen los medicamentos de uso tpico para ayudarle a seguir las instrucciones sobre dnde y cmo usarlos. Las farmacias generalmente imprimen las instrucciones del medicamento slo en las cajas y no directamente en los tubos del medicamento.   Si su medicamento es muy caro, por favor, pngase en contacto con nuestra oficina llamando al [phone] y presione la opcin 4 o envenos un mensaje a travs de MyChart.   No podemos decirle cul ser su copago por los medicamentos por adelantado ya que esto es diferente dependiendo de la cobertura de su seguro. Sin embargo, es posible que podamos encontrar un medicamento sustituto a menor costo o llenar un formulario para que el  seguro cubra el medicamento que se considera necesario.   Si se requiere una autorizacin previa para que su compaa de seguros cubra su medicamento, por favor permtanos de 1 a 2 das hbiles para completar este proceso.  Los precios de los medicamentos varan con frecuencia dependiendo del lugar de dnde se surte la receta y alguna farmacias pueden ofrecer precios ms baratos.  El sitio web www.goodrx.com tiene cupones para medicamentos de diferentes farmacias. Los precios aqu no tienen en cuenta lo que podra costar con la ayuda del seguro (puede ser ms barato con su seguro), pero el sitio web puede darle el precio si no utiliz ningn seguro.  - Puede imprimir el cupn correspondiente y llevarlo con su receta a la farmacia.  - Tambin puede pasar por nuestra oficina durante el horario de atencin regular y recoger una tarjeta de cupones de GoodRx.  - Si necesita que su receta se enve electrnicamente a una farmacia diferente, informe a nuestra oficina a travs de MyChart de [place]   o por telfono llamando al [phone] y presione la opcin 4.  

## 2023-03-25 NOTE — Progress Notes (Unsigned)
   Follow-Up Visit   Subjective  Patricia VANWEELDEN is a 67 y.o. female who presents for the following: Psoriasis of the scalp, pt currently using Clobetasol 0.05% solution to aa's a few times and week and alternating tar and salicylic acid shampoo. She has seen an improvement in psoriasis since starting topical treatment. Recheck ISK of the L forehead, previously treated with LN2, resolved per patient.   The following portions of the chart were reviewed this encounter and updated as appropriate: medications, allergies, medical history  Review of Systems:  No other skin or systemic complaints except as noted in HPI or Assessment and Plan.  Objective  Well appearing patient in no apparent distress; mood and affect are within normal limits.  Areas Examined: The face and scalp  Relevant exam findings are noted in the Assessment and Plan.     Assessment & Plan    PSORIASIS Well-demarcated erythematous papules/plaques with silvery scale, guttate pink scaly papules. 1% BSA on active treatment   Chronic condition with duration or expected duration over one year. Currently well-controlled.  Patient c/o joint pain in the knees and back.  Pt defers rheumatology referral today.  Treatment Plan: Clobetasol solution to aa's scalp QD 5d/wk.  Recommend salicylic acid or tar shampoo daily.    Topical steroids (such as triamcinolone, fluocinolone, fluocinonide, mometasone, clobetasol, halobetasol, betamethasone, hydrocortisone) can cause thinning and lightening of the skin if they are used for too long in the same area. Your physician has selected the right strength medicine for your problem and area affected on the body. Please use your medication only as directed by your physician to prevent side effects.   Counseling on psoriasis and coordination of care - no known fhx of psoriasis psoriasis is a chronic non-curable, but treatable genetic/hereditary disease that may have other systemic features  affecting other organ systems such as joints (Psoriatic Arthritis). It is associated with an increased risk of inflammatory bowel disease, heart disease, non-alcoholic fatty liver disease, and depression.  Treatments include light and laser treatments; topical medications; and systemic medications including oral and injectables.  SEBORRHEIC KERATOSIS - Face - Stuck-on, waxy, tan-brown papules and/or plaques  - Benign-appearing - Discussed benign etiology and prognosis. - Observe - Call for any changes  INFLAMED SEBORRHEIC KERATOSIS - L forehead  Resolved with LN2, no further tx needed   Return in about 1 year (around 03/24/2024) for TBSE and psoriasis follow up.  Luther Redo, CMA, am acting as scribe for Sarina Ser, MD .  Documentation: I have reviewed the above documentation for accuracy and completeness, and I agree with the above.  Sarina Ser, MD

## 2023-03-26 ENCOUNTER — Encounter: Payer: Self-pay | Admitting: Dermatology

## 2023-03-26 DIAGNOSIS — M9903 Segmental and somatic dysfunction of lumbar region: Secondary | ICD-10-CM | POA: Diagnosis not present

## 2023-03-26 DIAGNOSIS — M62830 Muscle spasm of back: Secondary | ICD-10-CM | POA: Diagnosis not present

## 2023-03-26 DIAGNOSIS — M5136 Other intervertebral disc degeneration, lumbar region: Secondary | ICD-10-CM | POA: Diagnosis not present

## 2023-03-26 DIAGNOSIS — M9905 Segmental and somatic dysfunction of pelvic region: Secondary | ICD-10-CM | POA: Diagnosis not present

## 2023-03-26 MED ORDER — ORAL CARE MOUTH RINSE: 15.0000 mL | Freq: Once | OROMUCOSAL | Status: AC

## 2023-03-26 MED ORDER — LACTATED RINGERS IV SOLN: INTRAVENOUS | Status: DC

## 2023-03-26 MED ORDER — CHLORHEXIDINE GLUCONATE 0.12 % MT SOLN: 15.0000 mL | Freq: Once | OROMUCOSAL | Status: AC

## 2023-03-26 MED ORDER — HEPARIN SODIUM (PORCINE) 5000 UNIT/ML IJ SOLN: 5000.0000 [IU] | INTRAMUSCULAR | Status: AC

## 2023-03-27 ENCOUNTER — Encounter: Payer: Self-pay | Admitting: Obstetrics and Gynecology

## 2023-03-27 ENCOUNTER — Ambulatory Visit: Payer: PPO | Admitting: Certified Registered Nurse Anesthetist

## 2023-03-27 ENCOUNTER — Other Ambulatory Visit: Payer: Self-pay

## 2023-03-27 ENCOUNTER — Encounter
Admission: RE | Disposition: A | Discharge status: Home / Self Care | Payer: Self-pay | Source: Ambulatory Visit | Attending: Obstetrics and Gynecology

## 2023-03-27 ENCOUNTER — Ambulatory Visit
Admission: RE | Admit: 2023-03-27 | Discharge: 2023-03-27 | Disposition: A | Discharge status: Home / Self Care | Payer: PPO | Source: Ambulatory Visit | Attending: Obstetrics and Gynecology | Admitting: Obstetrics and Gynecology

## 2023-03-27 ENCOUNTER — Ambulatory Visit: Payer: PPO | Admitting: Urgent Care

## 2023-03-27 DIAGNOSIS — N900 Mild vulvar dysplasia: Secondary | ICD-10-CM | POA: Diagnosis not present

## 2023-03-27 DIAGNOSIS — E785 Hyperlipidemia, unspecified: Secondary | ICD-10-CM | POA: Insufficient documentation

## 2023-03-27 DIAGNOSIS — I10 Essential (primary) hypertension: Secondary | ICD-10-CM | POA: Insufficient documentation

## 2023-03-27 DIAGNOSIS — L900 Lichen sclerosus et atrophicus: Secondary | ICD-10-CM | POA: Diagnosis not present

## 2023-03-27 DIAGNOSIS — N889 Noninflammatory disorder of cervix uteri, unspecified: Secondary | ICD-10-CM

## 2023-03-27 DIAGNOSIS — J45909 Unspecified asthma, uncomplicated: Secondary | ICD-10-CM | POA: Insufficient documentation

## 2023-03-27 DIAGNOSIS — M810 Age-related osteoporosis without current pathological fracture: Secondary | ICD-10-CM | POA: Diagnosis not present

## 2023-03-27 DIAGNOSIS — N841 Polyp of cervix uteri: Secondary | ICD-10-CM | POA: Diagnosis not present

## 2023-03-27 DIAGNOSIS — Z6841 Body Mass Index (BMI) 40.0 and over, adult: Secondary | ICD-10-CM | POA: Diagnosis not present

## 2023-03-27 DIAGNOSIS — I739 Peripheral vascular disease, unspecified: Secondary | ICD-10-CM | POA: Diagnosis not present

## 2023-03-27 DIAGNOSIS — K219 Gastro-esophageal reflux disease without esophagitis: Secondary | ICD-10-CM | POA: Insufficient documentation

## 2023-03-27 DIAGNOSIS — N903 Dysplasia of vulva, unspecified: Secondary | ICD-10-CM | POA: Diagnosis present

## 2023-03-27 HISTORY — PX: COLPOSCOPY: SHX161

## 2023-03-27 HISTORY — DX: Psoriasis, unspecified: L40.9

## 2023-03-27 HISTORY — PX: VULVAR LESION REMOVAL: SHX5391

## 2023-03-27 HISTORY — PX: CERVICAL POLYPECTOMY: SHX88

## 2023-03-27 HISTORY — PX: VULVA /PERINEUM BIOPSY: SHX319

## 2023-03-27 LAB — ABO/RH: ABO/RH(D): O POS

## 2023-03-27 SURGERY — EXAM UNDER ANESTHESIA
Anesthesia: General | Site: Vulva

## 2023-03-27 MED ORDER — BUPIVACAINE LIPOSOME 1.3 % IJ SUSP
INTRAMUSCULAR | Status: DC | PRN
  Administered 2023-03-27: 20 mL

## 2023-03-27 MED ORDER — ACETIC ACID 3 % SOLN
Status: AC
  Filled 2023-03-27: qty 500

## 2023-03-27 MED ORDER — OXYCODONE HCL 5 MG PO TABS: 5.0000 mg | ORAL_TABLET | Freq: Once | ORAL | Status: DC | PRN

## 2023-03-27 MED ORDER — FENTANYL CITRATE (PF) 100 MCG/2ML IJ SOLN
INTRAMUSCULAR | Status: DC | PRN
  Administered 2023-03-27 (×5): 25 ug via INTRAVENOUS

## 2023-03-27 MED ORDER — LIDOCAINE HCL (CARDIAC) PF 100 MG/5ML IV SOSY
PREFILLED_SYRINGE | INTRAVENOUS | Status: DC | PRN
  Administered 2023-03-27: 100 mg via INTRAVENOUS

## 2023-03-27 MED ORDER — BUPIVACAINE HCL (PF) 0.25 % IJ SOLN
INTRAMUSCULAR | Status: AC
  Filled 2023-03-27: qty 30

## 2023-03-27 MED ORDER — DEXAMETHASONE SODIUM PHOSPHATE 10 MG/ML IJ SOLN
INTRAMUSCULAR | Status: AC
  Filled 2023-03-27: qty 1

## 2023-03-27 MED ORDER — PROPOFOL 10 MG/ML IV BOLUS
INTRAVENOUS | Status: AC
  Filled 2023-03-27: qty 40

## 2023-03-27 MED ORDER — MIDAZOLAM HCL 2 MG/2ML IJ SOLN
INTRAMUSCULAR | Status: AC
  Filled 2023-03-27: qty 2

## 2023-03-27 MED ORDER — FENTANYL CITRATE (PF) 100 MCG/2ML IJ SOLN: 25.0000 ug | INTRAMUSCULAR | Status: DC | PRN

## 2023-03-27 MED ORDER — PROPOFOL 10 MG/ML IV BOLUS
INTRAVENOUS | Status: DC | PRN
  Administered 2023-03-27: 200 mg via INTRAVENOUS

## 2023-03-27 MED ORDER — ONDANSETRON HCL 4 MG/2ML IJ SOLN: 4.0000 mg | Freq: Once | INTRAMUSCULAR | Status: DC | PRN

## 2023-03-27 MED ORDER — KETOROLAC TROMETHAMINE 30 MG/ML IJ SOLN
INTRAMUSCULAR | Status: AC
  Filled 2023-03-27: qty 1

## 2023-03-27 MED ORDER — ONDANSETRON HCL 4 MG/2ML IJ SOLN
INTRAMUSCULAR | Status: AC
  Filled 2023-03-27: qty 2

## 2023-03-27 MED ORDER — CHLORHEXIDINE GLUCONATE 0.12 % MT SOLN
OROMUCOSAL | Status: AC
  Administered 2023-03-27: 15 mL via OROMUCOSAL
  Filled 2023-03-27: qty 15

## 2023-03-27 MED ORDER — ROCURONIUM BROMIDE 10 MG/ML (PF) SYRINGE
PREFILLED_SYRINGE | INTRAVENOUS | Status: AC
  Filled 2023-03-27: qty 10

## 2023-03-27 MED ORDER — ONDANSETRON HCL 4 MG/2ML IJ SOLN
INTRAMUSCULAR | Status: DC | PRN
  Administered 2023-03-27: 4 mg via INTRAVENOUS

## 2023-03-27 MED ORDER — LIDOCAINE HCL (PF) 2 % IJ SOLN
INTRAMUSCULAR | Status: AC
  Filled 2023-03-27: qty 5

## 2023-03-27 MED ORDER — FENTANYL CITRATE (PF) 100 MCG/2ML IJ SOLN
INTRAMUSCULAR | Status: AC
  Filled 2023-03-27: qty 2

## 2023-03-27 MED ORDER — ACETIC ACID 3 % SOLN
Status: DC | PRN
  Administered 2023-03-27: 1 via TOPICAL

## 2023-03-27 MED ORDER — PHENYLEPHRINE 80 MCG/ML (10ML) SYRINGE FOR IV PUSH (FOR BLOOD PRESSURE SUPPORT)
PREFILLED_SYRINGE | INTRAVENOUS | Status: DC | PRN
  Administered 2023-03-27: 40 ug via INTRAVENOUS

## 2023-03-27 MED ORDER — OXYCODONE HCL 5 MG/5ML PO SOLN: 5.0000 mg | Freq: Once | ORAL | Status: DC | PRN

## 2023-03-27 MED ORDER — BUPIVACAINE LIPOSOME 1.3 % IJ SUSP
INTRAMUSCULAR | Status: AC
  Filled 2023-03-27: qty 20

## 2023-03-27 MED ORDER — KETOROLAC TROMETHAMINE 30 MG/ML IJ SOLN
INTRAMUSCULAR | Status: DC | PRN
  Administered 2023-03-27: 15 mg via INTRAVENOUS

## 2023-03-27 MED ORDER — DEXAMETHASONE SODIUM PHOSPHATE 10 MG/ML IJ SOLN
INTRAMUSCULAR | Status: DC | PRN
  Administered 2023-03-27: 5 mg via INTRAVENOUS

## 2023-03-27 MED ORDER — HEPARIN SODIUM (PORCINE) 5000 UNIT/ML IJ SOLN
INTRAMUSCULAR | Status: AC
  Administered 2023-03-27: 5000 [IU] via SUBCUTANEOUS
  Filled 2023-03-27: qty 1

## 2023-03-27 MED ORDER — MIDAZOLAM HCL 2 MG/2ML IJ SOLN
INTRAMUSCULAR | Status: DC | PRN
  Administered 2023-03-27: 2 mg via INTRAVENOUS

## 2023-03-27 MED ORDER — ACETAMINOPHEN 10 MG/ML IV SOLN: 1000.0000 mg | Freq: Once | INTRAVENOUS | Status: DC | PRN

## 2023-03-27 MED ORDER — SUCCINYLCHOLINE CHLORIDE 200 MG/10ML IV SOSY
PREFILLED_SYRINGE | INTRAVENOUS | Status: AC
  Filled 2023-03-27: qty 10

## 2023-03-27 SURGICAL SUPPLY — 34 items
BLADE SURG 15 STRL LF DISP TIS (BLADE) ×4 IMPLANT
BLADE SURG 15 STRL SS (BLADE) ×4
BLADE SURG SZ10 CARB STEEL (BLADE) ×4 IMPLANT
CATH ROBINSON RED A/P 16FR (CATHETERS) ×4 IMPLANT
DRAPE 3/4 80X56 (DRAPES) ×4 IMPLANT
DRAPE PERI LITHO V/GYN (MISCELLANEOUS) ×4 IMPLANT
DRAPE UNDER BUTTOCK W/FLU (DRAPES) ×4 IMPLANT
DRSG TELFA 3X8 NADH STRL (GAUZE/BANDAGES/DRESSINGS) ×4 IMPLANT
ELECT BLADE 6 FLAT ULTRCLN (ELECTRODE) IMPLANT
ELECT CAUTERY BLADE 6.4 (BLADE) ×4 IMPLANT
ELECT CAUTERY NEEDLE TIP 1.0 (MISCELLANEOUS) ×4
ELECT REM PT RETURN 9FT ADLT (ELECTROSURGICAL) ×4
ELECTRODE CAUTERY NEDL TIP 1.0 (MISCELLANEOUS) ×4 IMPLANT
ELECTRODE REM PT RTRN 9FT ADLT (ELECTROSURGICAL) ×4 IMPLANT
GAUZE 4X4 16PLY ~~LOC~~+RFID DBL (SPONGE) ×8 IMPLANT
GLOVE BIO SURGEON STRL SZ 6.5 (GLOVE) ×4 IMPLANT
GLOVE INDICATOR 7.0 STRL GRN (GLOVE) ×4 IMPLANT
GOWN STRL REUS W/ TWL LRG LVL3 (GOWN DISPOSABLE) ×8 IMPLANT
GOWN STRL REUS W/TWL LRG LVL3 (GOWN DISPOSABLE) ×8
JELLY LUBE 2OZ STRL (MISCELLANEOUS) IMPLANT
MANIFOLD NEPTUNE II (INSTRUMENTS) ×4 IMPLANT
MARKER SKIN DUAL TIP RULER LAB (MISCELLANEOUS) ×4 IMPLANT
NDL HYPO 22X1.5 SAFETY MO (MISCELLANEOUS) ×4 IMPLANT
NEEDLE HYPO 22X1.5 SAFETY MO (MISCELLANEOUS) ×4 IMPLANT
NS IRRIG 500ML POUR BTL (IV SOLUTION) ×4 IMPLANT
PACK BASIN MINOR ARMC (MISCELLANEOUS) ×4 IMPLANT
PAD OB MATERNITY 4.3X12.25 (PERSONAL CARE ITEMS) ×4 IMPLANT
PAD PREP 24X41 OB/GYN DISP (PERSONAL CARE ITEMS) ×4 IMPLANT
SUT VIC AB 2-0 SH 27 (SUTURE)
SUT VIC AB 2-0 SH 27XBRD (SUTURE) ×4 IMPLANT
SUT VIC AB 4-0 SH 27 (SUTURE)
SUT VIC AB 4-0 SH 27XANBCTRL (SUTURE) IMPLANT
SYR CONTROL 10ML LL (SYRINGE) ×4 IMPLANT
TRAP FLUID SMOKE EVACUATOR (MISCELLANEOUS) ×4 IMPLANT

## 2023-03-27 NOTE — Discharge Instructions (Addendum)
Vulvar  ablation, Care After Refer to this sheet in the next few weeks. These instructions provide you with information on caring for yourself after your procedure. Your health care provider may also give you more specific instructions. Your treatment has been planned according to current medical practices, but problems sometimes occur. Call your health care provider if you have any problems or questions after your procedure.   WHAT TO EXPECT AFTER THE PROCEDURE After your procedure, it is typical to have the following: Pelvic and vulvar discomfort and pain Feeling tired. Minimal bleeding from the wound  HOME CARE INSTRUCTIONS  It takes 4-6 weeks to recover from this surgery. Make sure you follow all your health care provider's instructions. Home care instructions may include: Take over the counter pain medicines as needed.  Take ibuprofen, and tylenol for pain as needed.  Take sitz baths or shower two twice a day, clean wound, rinse, blot and blow dry and also perform after every bowel movement.  Do not douche, use tampons, or have sexual intercourse for at least 4 weeks or until your health care provider says you can.   If you are having difficulty urinating you can try urinating in the bath tub or shower, or use spray bottle to dilute urine and this will decrease pain.  Apply SSD cream or xylocaine gel to area as need for pain. You also try dermaplast, over the counter spray, for pain.  You can resume your normal diet      If you are constipated, you can take a mild laxative. Eating foods high in fiber may also help with constipation. Eat plenty of raw fruits and vegetables, whole grains, and beans. Drink enough fluids to keep your urine clear or pale yellow.   Try to have someone at home with you for the first week to help around the house. Keep all follow-up appointments.  SEEK MEDICAL CARE IF:  You have chills or fever. You feel dizzy or light-headed.   You have pain or bleeding when  you urinate.    You have persistent nausea and vomiting.     You you have heavy bleeding Your pain medicine is not helping.    SEEK IMMEDIATE MEDICAL CARE IF:  You have a fever and your symptoms suddenly get worse. You have severe abdominal pain. You have chest pain. You have shortness of breath. You faint. You have pain, swelling, or redness of your leg. You have very heavy vaginal or vulvar bleeding with blood clots. MAKE SURE YOU: Understand these instructions. Will watch your condition. Will get help right away if you are not doing well or get worse.   Vulvar Biopsy, Care After Refer to this sheet in the next few weeks. These instructions provide you with information on caring for yourself after your procedure. Your health care provider may also give you more specific instructions. Your treatment has been planned according to current medical practices, but problems sometimes occur. Call your health care provider if you have any problems or questions after your procedure.   WHAT TO EXPECT AFTER THE PROCEDURE After your procedure, it is typical to have the following: Pelvic and vulvar discomfort and pain Minimal bleeding from the wound  HOME CARE INSTRUCTIONS  It takes 1-4 weeks to for the biopsy site to heal. Make sure you follow all your health care provider's instructions. Home care instructions may include: Take over the counter pain medicines as needed.  You may take sitz baths or showers to clean wound, rinse,  blot and blow dry and also perform after every bowel movement to keep the area clean  If you are having difficulty urinating due to pain you can try urinating in the bath tub or shower, or use spray bottle to dilute urine and this will decrease pain.  Apply xylocaine gel to area as need for pain.   SEEK MEDICAL CARE IF:  You have chills or fever. You feel dizzy or light-headed.   You have pain or bleeding when you urinate.    You have persistent nausea and vomiting.      You you have heavy bleeding Your pain medicine is not helping.    SEEK IMMEDIATE MEDICAL CARE IF:  You have a fever and your symptoms suddenly get worse. You have severe abdominal pain. You have chest pain. You have shortness of breath. You faint. You have pain, swelling, or redness of your leg. You have very heavy vaginal or vulvar bleeding with blood clots. AMBULATORY SURGERY  DISCHARGE INSTRUCTIONS   The drugs that you were given will stay in your system until tomorrow so for the next 24 hours you should not:  Drive an automobile Make any legal decisions Drink any alcoholic beverage   You may resume regular meals tomorrow.  Today it is better to start with liquids and gradually work up to solid foods.  You may eat anything you prefer, but it is better to start with liquids, then soup and crackers, and gradually work up to solid foods.   Please notify your doctor immediately if you have any unusual bleeding, trouble breathing, redness and pain at the surgery site, drainage, fever, or pain not relieved by medication.    Additional Instructions:        Please contact your physician with any problems or Same Day Surgery at [phone], Monday through Friday 6 am to 4 pm, or [place] at [facility] number at [phone].

## 2023-03-27 NOTE — Op Note (Signed)
   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  The site of the vulva vulvar biopsies included right 3:00 lateral,  left 11:00 medial, left 11:00 lateral to labia minora, left 3:00 lateral, right perianal 9:00, and left perianal 0000000.  COMPLICATIONS: NONE   DISPOSITION: PACU - hemodynamically stable.  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   FINDINGS: Exam under anesthesia after application of acetic acid revealed acetowhite epithelium involving bilateral labia minora of the vulva multiple areas as well as the bilateral perianal areas.  Please refer to clinic vulvar diagram.  Examined anesthesia also revealed a 8 mm endocervical polyp.  The cervix was otherwise normal.  Vagina normal appearing.  Bimanual exam revealed a normal-sized uterus and no adnexal masses.  PROCEDURE IN DETAIL: After informed consent was obtained, the patient was taken to the operating room where anesthesia was obtained without difficulty. The patient was positioned in the dorsal lithotomy position in Candycane stirrups.  Time-out was performed. The patient was examined under anesthesia with vulvar colposcopic exam after application of acetic acid.The above findings were noted. She was prepped with 1/2 Hibiclens and 1/2 NS. The patient was draped using sterile blue towels.  Her bladder was drained  using appropriate sterile technique with in and out catheterization.  Multiple biopsies were performed as noted above.  Ablation of the vulva was performed using cautery to the second dermal plane and all visible vulvar lesions in that area were ablated.Hemostasis was obtained with cauterization.  Approximately 20 cc of Exparel was injected into all the sites. The patient tolerated the procedure well.  Sponge, lap and needle counts were correct x2.  The patient was taken to recovery room in excellent condition.  Antibiotics: Not given because not indicated by scheduled procedure.  Wound: clean contaminated  VTE prophylaxis: was ordered perioperatively.    Gillis Ends, MD

## 2023-03-27 NOTE — Anesthesia Preprocedure Evaluation (Addendum)
Anesthesia Evaluation  Patient identified by MRN, date of birth, ID band Patient awake    Reviewed: Allergy & Precautions, H&P , NPO status , Patient's Chart, lab work & pertinent test results, reviewed documented beta blocker date and time   Airway Mallampati: III  TM Distance: >3 FB Neck ROM: full    Dental  (+) Teeth Intact   Pulmonary asthma    Pulmonary exam normal        Cardiovascular hypertension, + Peripheral Vascular Disease  Normal cardiovascular exam Rhythm:regular Rate:Normal     Neuro/Psych negative neurological ROS  negative psych ROS   GI/Hepatic Neg liver ROS,GERD  Controlled and Medicated,,  Endo/Other    Morbid obesity  Renal/GU negative Renal ROS  negative genitourinary   Musculoskeletal   Abdominal  (+) + obese  Peds  Hematology negative hematology ROS (+)   Anesthesia Other Findings Past Medical History: No date: Allergy No date: Arthritis No date: Asthma No date: Barrett esophagus No date: Chicken pox No date: Chronic vaginitis No date: GERD (gastroesophageal reflux disease) No date: History of colon polyps No date: Hyperlipidemia No date: Hypertension No date: Osteoporosis No date: Vitamin D deficiency Past Surgical History: 1983: CHOLECYSTECTOMY 08/28/2016: COLONOSCOPY WITH PROPOFOL; N/A     Comment:  Procedure: COLONOSCOPY WITH PROPOFOL;  Surgeon: Lollie Sails, MD;  Location: [facility] ENDOSCOPY;  Service:               Endoscopy;  Laterality: N/A; 08/28/2016: ESOPHAGOGASTRODUODENOSCOPY (EGD) WITH PROPOFOL; N/A     Comment:  Procedure: ESOPHAGOGASTRODUODENOSCOPY (EGD) WITH               PROPOFOL;  Surgeon: Lollie Sails, MD;  Location:               [facility] ENDOSCOPY;  Service: Endoscopy;  Laterality: N/A; 09/15/2019: ESOPHAGOGASTRODUODENOSCOPY (EGD) WITH PROPOFOL; N/A     Comment:  Procedure: ESOPHAGOGASTRODUODENOSCOPY (EGD) WITH               PROPOFOL;  Surgeon:  Lollie Sails, MD;  Location:               [facility] ENDOSCOPY;  Service: Endoscopy;  Laterality: N/A; 1969: TONSILLECTOMY BMI    Body Mass Index: 49.38 kg/m     Reproductive/Obstetrics negative OB ROS                             Anesthesia Physical Anesthesia Plan  ASA: 3  Anesthesia Plan: General   Post-op Pain Management:    Induction: Intravenous  PONV Risk Score and Plan: 3 and Ondansetron, Dexamethasone and Midazolam  Airway Management Planned: Oral ETT and Video Laryngoscope Planned  Additional Equipment: None  Intra-op Plan:   Post-operative Plan: Extubation in OR  Informed Consent: I have reviewed the patients History and Physical, chart, labs and discussed the procedure including the risks, benefits and alternatives for the proposed anesthesia with the patient or authorized representative who has indicated his/her understanding and acceptance.     Dental Advisory Given  Plan Discussed with: CRNA  Anesthesia Plan Comments: (Discussed risks of anesthesia with patient, including PONV, sore throat, lip/dental/eye damage. Rare risks discussed as well, such as cardiorespiratory and neurological sequelae, and allergic reactions. Discussed the role of CRNA in patient's perioperative care. Patient understands. Patient informed about increased incidence of above perioperative risk due to high BMI. Patient understands.  )  Anesthesia Quick Evaluation  

## 2023-03-27 NOTE — Interval H&P Note (Signed)
History and Physical Interval Note:  03/27/2023 7:17 AM  Patricia Garner  has presented today for surgery, with the diagnosis of vulvar intraepithelial lesion.  The various methods of treatment have been discussed with the patient and family. After consideration of risks, benefits and other options for treatment, the patient has consented to  Procedure(s): EXAM UNDER ANESTHESIA, ABLATION VULVAR INTRAEPITHELIAL LESION, COLPOSCOPY, MULTIPLE VULVAR BIOPSIES (N/A) as a surgical intervention.  The patient's history has been reviewed, patient examined, no change in status, stable for surgery.  I have reviewed the patient's chart and labs.  Questions were answered to the patient's satisfaction.     [place]

## 2023-03-27 NOTE — Transfer of Care (Signed)
Immediate Anesthesia Transfer of Care Note  Patient: Patricia Garner  Procedure(s) Performed: EXAM UNDER ANESTHESIA, ABLATION VULVAR INTRAEPITHELIAL LESION, COLPOSCOPY, MULTIPLE VULVAR BIOPSIES  Patient Location: PACU  Anesthesia Type:General  Level of Consciousness: awake, alert , and oriented  Airway & Oxygen Therapy: Patient Spontanous Breathing and Patient connected to face mask oxygen  Post-op Assessment: Report given to RN and Post -op Vital signs reviewed and stable  Post vital signs: Reviewed and stable  Last Vitals:  Vitals Value Taken Time  BP 140/73 03/27/23 0836  Temp    Pulse 78 03/27/23 0839  Resp 14 03/27/23 0839  SpO2 100 % 03/27/23 0839  Vitals shown include unvalidated device data.  Last Pain:  Vitals:   03/27/23 [id number]  TempSrc: Oral  PainSc: 0-No pain         Complications: No notable events documented.

## 2023-03-27 NOTE — Anesthesia Postprocedure Evaluation (Signed)
Anesthesia Post Note  Patient: Patricia Garner  Procedure(s) Performed: EXAM UNDER ANESTHESIA (Vagina ) VULVAR BIOPSY - MULTIPLE (Bilateral: Vulva) COLPOSCOPY (Vagina ) CERVICAL POLYPECTOMY/ BIOPSY (Cervix) ABLATION VULVAR INTRAEPITHELIAL LESION (Vulva)  Patient location during evaluation: PACU Anesthesia Type: General Level of consciousness: awake and alert Pain management: pain level controlled Vital Signs Assessment: post-procedure vital signs reviewed and stable Respiratory status: spontaneous breathing, nonlabored ventilation, respiratory function stable and patient connected to nasal cannula oxygen Cardiovascular status: blood pressure returned to baseline and stable Postop Assessment: no apparent nausea or vomiting Anesthetic complications: no   No notable events documented.   Last Vitals:  Vitals:   03/27/23 0900 03/27/23 0919  BP: 132/75 139/61  Pulse: 81 81  Resp: 17 (!) 22  Temp:  (!) 36.2 C  SpO2: 99% 98%    Last Pain:  Vitals:   03/27/23 0919  TempSrc: Temporal  PainSc: 1                  Arita Miss

## 2023-03-27 NOTE — H&P (Signed)
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 female who is seen in consultation from Dr. Alicia Copland, PA for evaluation of VIN.   She is postmenopausal., presents today for VIN. At her annual exam she c/o  vulvar irritation which is chronic. Previously, Dr. Van Dalen gave her mycolog cream that helped, but sx persisted. Rx changed to lotrisone crm 4/18 with sx improvement. Sx flare with sweating. Sx improve with lotrisone cream BID for 2 wks but then recur after a few days now. She has had vulvar irritation issues and has been in a scratch/itch cycle for years. Recently she tried topical steroids but that made her symptoms worse.   She saw Dr. Copland who did a thorough exam with biopsy. See figure below with findings.   Genitourinary Comments: EXT EXAM WITH ERYTHEMA IN PATCHES BILAT LABIA MAJORA WITH A LITTLE SKIN BREAKDOWN     Right Labia: tenderness and lesions.     Right Labia: No rash.    Left Labia: tenderness and lesions.     Left Labia: No rash.    BIOPSY:  - Koilocytic atypia, consistent with low-grade squamous intraepithelial lesion (VIN1, low-grade dysplasia)    Last Pap: 07/01/20 Results were: no abnormalities /neg HPV DNA.     Last mammogram: 08/18/19  Results were: normal--routine follow-up in 12 months There is no FH of breast cancer. There is no FH of ovarian cancer. The patient does not do self-breast exams.   Colonoscopy: colonoscopy 2017; Repeat after 5 yrs per pt report. She has appt 4/24. 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   PVD (peripheral vascular disease) (HCC) 11/06/2022   Prediabetes 11/09/2021   Iron deficiency 11/09/2021   Gastroesophageal reflux disease without esophagitis 11/09/2021   Mixed hyperlipidemia 11/09/2021   Class 3 severe obesity due  to excess calories with body mass index (BMI) of 45.0 to 49.9 in adult (HCC) 11/09/2021   Pain in joint of left knee 08/17/2021   Osteoporosis 04/12/2018   Essential (primary) hypertension 06/13/2015   Uncomplicated asthma 06/13/2015   Barrett's esophagus without dysplasia 06/13/2015   Osteoarthritis 06/13/2015    Past Medical History: Past Medical History:  Diagnosis Date   Allergy    Arthritis    Asthma    Barrett esophagus    Chicken pox    Chronic vaginitis    Dysplastic nevus 08/30/2016   upper back spinal, mild   GERD (gastroesophageal reflux disease)    History of colon polyps    Hyperlipidemia    Hypertension    Osteoporosis    Vitamin D deficiency     Past Surgical History: Past Surgical History:  Procedure Laterality Date   CHOLECYSTECTOMY  1983   COLONOSCOPY WITH PROPOFOL N/A 08/28/2016   Procedure: COLONOSCOPY WITH PROPOFOL;  Surgeon: Martin U Skulskie, MD;  Location: [facility] ENDOSCOPY;  Service: Endoscopy;  Laterality: N/A;   ESOPHAGOGASTRODUODENOSCOPY (EGD) WITH PROPOFOL N/A 08/28/2016   Procedure: ESOPHAGOGASTRODUODENOSCOPY (EGD) WITH PROPOFOL;  Surgeon: Martin U Skulskie, MD;  Location: [facility] ENDOSCOPY;  Service: Endoscopy;  Laterality: N/A;   ESOPHAGOGASTRODUODENOSCOPY (EGD) WITH PROPOFOL N/A 09/15/2019   Procedure: ESOPHAGOGASTRODUODENOSCOPY (EGD) WITH PROPOFOL;  Surgeon: Skulskie, Martin U, MD;  Location: [facility] ENDOSCOPY;  Service: Endoscopy;  Laterality: N/A;   TONSILLECTOMY  1969    Past Gynecologic History:    As per HPI  OB History:  OB History  Gravida Para Term Preterm AB Living  3 2 2   1 2  SAB IAB Ectopic Multiple Live Births  1       2    # Outcome Date GA Lbr Len/2nd Weight Sex Delivery Anes PTL Lv  3 Term 01/04/81 [redacted]w[redacted]d  8 lb 4.8 oz (3.765 kg) F Vag-Spont     2 Term 08/20/79 [redacted]w[redacted]d  6 lb 11.2 oz (3.039 kg) F Vag-Spont     1 SAB             Family History: Family History  Problem Relation Age of Onset   Arthritis Mother    Heart disease  Mother    Stroke Mother    Hypertension Mother    Alcohol abuse Father    Alcohol abuse Sister    Mental illness Sister    Stomach cancer Sister 66   Alcohol abuse Brother    Drug abuse Brother    Arthritis Maternal Grandmother    Stroke Maternal Grandmother    Colon cancer Maternal Grandfather 74    Social History: Social History   Socioeconomic History   Marital status: Married    Spouse name: Not on file   Number of children: Not on file   Years of education: Not on file   Highest education level: Not on file  Occupational History   Not on file  Tobacco Use   Smoking status: Never   Smokeless tobacco: Never  Vaping Use   Vaping Use: Never used  Substance and Sexual Activity   Alcohol use: No    Alcohol/week: 0.0 standard drinks of alcohol   Drug use: Never   Sexual activity: Yes    Birth control/protection: Post-menopausal  Other Topics Concern   Not on file  Social History Narrative   Not on file   Social Determinants of Health   Financial Resource Strain: Not on file  Food Insecurity: Not on file  Transportation Needs: Not on file  Physical Activity: Not on file  Stress: Not on file  Social Connections: Not on file  Intimate Partner Violence: Not on file    Allergies: Allergies  Allergen Reactions   Bactrim [Sulfamethoxazole-Trimethoprim] Nausea Only   Codeine Nausea And Vomiting   Hydrocodone Hives   Lipitor [Atorvastatin] Other (See Comments)    Myalgia    Medrol [Methylprednisolone] Nausea Only    Current Medications: Current Outpatient Medications  Medication Sig Dispense Refill   albuterol (VENTOLIN HFA) 108 (90 Base) MCG/ACT inhaler INHALE 1 TO 2 PUFFS BY MOUTH EVERY 6 HOURS AS NEEDED FOR WHEEZING OR SHORTNESS OF BREATH 8 g 5   Calcium-Vitamin D 600-200 MG-UNIT tablet Take by mouth.     clobetasol (TEMOVATE) 0.05 % external solution Apply 1 Application topically 2 (two) times daily. Apply to aa's psoriasis in the scalp QD up to 5d/wk.  Avoid applying to face, groin, and axilla. Use as directed. Long-term use can cause thinning of the skin. 50 mL 1   ferrous sulfate 325 (65 FE) MG EC tablet Take 325 mg by mouth as needed.     lisinopril-hydrochlorothiazide (ZESTORETIC) 20-25 MG tablet TAKE 1 TABLET BY MOUTH ONCE DAILY (OFFICE  VISIT  REQUIRED  FOR  FURTHER  REFILLS) 90 tablet 1   Moringa 500 MG CAPS Take by mouth.     Multiple Vitamins-Minerals (MULTIVITAMIN WITH MINERALS) tablet Take by mouth.     pantoprazole (PROTONIX) 40 MG tablet Take 1 tablet (  40 mg total) by mouth daily. 90 tablet 1   clotrimazole-betamethasone (LOTRISONE) cream APPLY TOPICALLY TWICE DAILY FOR TWO WEEKS. THEN USE TWICE DAILY AS NEEDED FOR SYMPTOMS (Patient not taking: Reported on 01/28/2023) 45 g 0   No current facility-administered medications for this visit.    Review of Systems General: negative for fevers, changes in weight or night sweats Skin: negative for changes in moles or sores or rash Eyes: negative for changes in vision HEENT: negative for change in hearing, tinnitus, voice changes Pulmonary: negative for dyspnea, orthopnea, productive cough, wheezing Cardiac: negative for palpitations, pain Gastrointestinal: negative for nausea, vomiting, constipation, diarrhea, hematemesis, hematochezia Genitourinary/Sexual: negative for dysuria, retention, hematuria, incontinence Ob/Gyn:  as per HPI Musculoskeletal: negative for pain, joint pain, back pain Hematology: negative for easy bruising, abnormal bleeding Neurologic/Psych: negative for headaches, seizures, paralysis, weakness, numbness   Objective:  Physical Examination:  BP 133/67   Pulse 77   Temp 98.6 F (37 C)   Resp 20   Wt 261 lb 4.8 oz (118.5 kg)   SpO2 100%   BMI 46.29 kg/m    ECOG Performance Status: 0 - Asymptomatic  GENERAL: Patient is a well appearing female in no acute distress HEENT:  PERRL, soft, no thyroid without masses.  NODES:  No cervical, supraclavicular,  axillary, or inguinal lymphadenopathy palpated.  LUNGS:  Clear to auscultation bilaterally.  No wheezes or rhonchi. HEART:  Regular rate and rhythm.  ABDOMEN:  Soft, nontender, nondistended.  EXTREMITIES:  No peripheral edema.   NEURO:  Nonfocal. Well oriented.  Appropriate affect.  Pelvic chaperoned by nurse CMA Vulva: atrophic. She states she has itching all over. Bilaterally there are areas that appear c/w lichen schlerosis which are well demarcated and different in appearance compared to the rest of the vulva and site of the biopsy at 3 oclock.  Vagina: normal vagina  Cervix: unable to visualize, exam challenging and she is uncomfortable.  Bimanual: limited.No masses appreciated  RV: Deferred   Lab Review Labs on site today:  Lab Results  Component Value Date   WBC 7.8 11/06/2022   HGB 12.4 11/06/2022   HCT 36.8 11/06/2022   MCV 93.2 11/06/2022   PLT 341 11/06/2022   Lab Results  Component Value Date   HGBA1C 6.3 (H) 11/06/2022   Lab Results  Component Value Date   CREATININE 0.66 11/06/2022   BUN 16 11/06/2022   NA 141 11/06/2022   K 4.2 11/06/2022   CL 100 11/06/2022   CO2 30 11/06/2022     Radiologic Imaging: N/A    Assessment:  Patricia Garner is a 66 y.o. 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   Medical co-morbidities complicating care: HTN and obesity.  Plan:   Problem List Items Addressed This Visit       Musculoskeletal and Integument   VIN I (vulvar intraepithelial neoplasia I) - Primary   Other Visit Diagnoses     Counseling and coordination of care           We discussed options for management and recommended examine under anesthesia, colposcopy, multiple vulvar biopsies for mapping,and vulvar ablation of the known biopsied area consistent with VIN 1.  Best vulvar hygiene practices shared with her today.  In addition we discussed the importance of breaking the scratch itch scratch  cycle.  Once we have her biopsies we can determine subsequent management.  The risks of surgery were discussed in detail and she understands these   risk to include but not limited to: infection, bleeding, anesthesia risk, possible death, possible need for further surgery, medical complications such as heart attack, stroke, pulmonary edema, abnormal heart rhythm, and pneumonia. The patient will receive DVT and antibiotic prophylaxis as indicated. She voiced a clear understanding. She had the opportunity to ask questions and electronic informed consent was obtained today.      Periop Anticoagulation    VTE Risk Plan: Moderate Risk: One pre-op dose UFH AND SCDs   She will have preop anesthesia appointment. Labs/CXR/EKG per anesthesia discretion.    Suggested return to clinic in 3-4 weeks after surgery.   A total of 60 minutes were spent with the patient/family today; >50% was spent in education, counseling and coordination of care for VIN and vulvar irritation.   The patient's diagnosis, an outline of the further diagnostic and laboratory studies which will be required, the recommendation, and alternatives were discussed.  All questions were answered to the patient's satisfaction.  I personally had a face to face interaction and evaluated the patient. I have reviewed her history and available records and have performed the physical exam.  I have discussed the case with the patient.   Rocko Fesperman Alvarez Alika Saladin, MD      CC:  Alicia Copland, PA 

## 2023-03-27 NOTE — Anesthesia Procedure Notes (Signed)
Procedure Name: LMA Insertion Date/Time: 03/27/2023 7:40 AM  Performed by: Demetrius Charity, CRNAPre-anesthesia Checklist: Patient identified, Patient being monitored, Timeout performed, Emergency Drugs available and Suction available Patient Re-evaluated:Patient Re-evaluated prior to induction Oxygen Delivery Method: Circle system utilized Preoxygenation: Pre-oxygenation with 100% oxygen Induction Type: IV induction Ventilation: Mask ventilation without difficulty LMA: LMA inserted LMA Size: 4.0 Tube type: Oral Number of attempts: 1 Placement Confirmation: positive ETCO2 and breath sounds checked- equal and bilateral Tube secured with: Tape Dental Injury: Teeth and Oropharynx as per pre-operative assessment

## 2023-03-28 ENCOUNTER — Encounter: Payer: Self-pay | Admitting: Obstetrics and Gynecology

## 2023-03-28 LAB — SURGICAL PATHOLOGY

## 2023-04-03 ENCOUNTER — Telehealth: Payer: Self-pay | Admitting: Obstetrics and Gynecology

## 2023-04-03 NOTE — Telephone Encounter (Signed)
I reviewed pathology findings.  She had a benign endometrial polyp and all of the biopsies were consistent with lichen sclerosis.  I do not treat this condition and I will reach out to her referring provider to see if they can offer her treatment there.  Otherwise I did discuss that Dr. Susy Manor at [facility] has a dermatology practice with a focus on gynecologic diseases we would be happy to refer her.    I answered the patient's questions accordingly. She is doing well.  She is having more vulvar pain at the time of urination.  This is mostly on the left where the ablative procedure was performed.  We discussed ways to reduce her symptoms of pain. She will follow up in clinic for postoperative exam. She will contact us if she has any questions or concerns.    SURGICAL PATHOLOGY CASE: [id number] PATIENT: Patricia Garner Surgical Pathology Report     Specimen Submitted: A. Endocervical polyp B. Vulva, right lateral, 3:00 C. Vulva, medial, 11:00 D. Vulva, lateral labia minora, 11:00 E. Vulva, left lateral, 3:00 F. Vulva, right perianal, 9:00 G. Vulva, left perianal, 3:00  Clinical History: Vulvar intraepithelial lesion    DIAGNOSIS: A.  ENDOCERVICAL POLYP; POLYPECTOMY: - BENIGN ENDOCERVICAL POLYP. - NEGATIVE FOR SQUAMOUS INTRAEPITHELIAL LESION AND MALIGNANCY.  B. VULVA, RIGHT LATERAL AT 0000000; BIOPSY: - LICHEN SCLEROSUS. - NEGATIVE FOR SQUAMOUS INTRAEPITHELIAL LESION AND MALIGNANCY.  C. VULVA, MEDIAL AT 11:00; BIOPSY: - BENIGN SQUAMOUS MUCOSA WITH CHANGES OF EARLY LICHEN SCLEROSUS. - NEGATIVE FOR SQUAMOUS INTRAEPITHELIAL LESION AND MALIGNANCY.  D.  VULVA, LATERAL LABIA MINORA AT 11:00; BIOPSY: - BENIGN SQUAMOUS MUCOSA WITH FOCAL CHANGES SUGGESTIVE OF EARLY LICHEN SCLEROSUS. - NEGATIVE FOR SQUAMOUS INTRAEPITHELIAL LESION AND MALIGNANCY.  E. VULVA, LEFT LATERAL AT 0000000; BIOPSY: - LICHEN SCLEROSUS. - NEGATIVE FOR SQUAMOUS INTRAEPITHELIAL LESION AND MALIGNANCY.  F. VULVA, RIGHT  PERIANAL AT [id number]; BIOPSY: - LICHEN SCLEROSUS. - NEGATIVE FOR SQUAMOUS INTRAEPITHELIAL LESION AND MALIGNANCY.  G. VULVA, LEFT PERIANAL AT 3:00; BIOPSY: - MINUTE SUPERFICIAL FRAGMENT OF THINNED SQUAMOUS EPITHELIUM WITH HYPERKERATOSIS AND LOSS OF RETE PEGS, POTENTIALLY SUGGESTIVE OF SUPERFICIAL SAMPLING OF LICHEN SCLEROSUS. - NEGATIVE FOR SQUAMOUS INTRAEPITHELIAL LESION AND MALIGNANCY.  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  Biopsy sections are without koilocytic change, and predominantly show thinned epidermis with hyperkeratosis, hypergranulosis, loss of rete pegs, and underlying dermal fibrosis/homogenization, compatible with lichen sclerosus.    Gillis Ends, MD

## 2023-04-08 ENCOUNTER — Ambulatory Visit (INDEPENDENT_AMBULATORY_CARE_PROVIDER_SITE_OTHER): Payer: PPO | Admitting: Obstetrics and Gynecology

## 2023-04-08 ENCOUNTER — Encounter: Payer: Self-pay | Admitting: Obstetrics and Gynecology

## 2023-04-08 VITALS — BP 120/80 | Ht 63.0 in | Wt 258.0 lb

## 2023-04-08 DIAGNOSIS — G8918 Other acute postprocedural pain: Secondary | ICD-10-CM | POA: Diagnosis not present

## 2023-04-08 DIAGNOSIS — L900 Lichen sclerosus et atrophicus: Secondary | ICD-10-CM

## 2023-04-08 MED ORDER — CLOBETASOL PROPIONATE 0.05 % EX OINT: TOPICAL_OINTMENT | CUTANEOUS | 0 refills | Status: DC

## 2023-04-08 MED ORDER — LIDOCAINE VISCOUS HCL 2 % MT SOLN: OROMUCOSAL | 1 refills | Status: DC

## 2023-04-08 NOTE — Progress Notes (Unsigned)
Patricia Munroe, NP   Chief Complaint  Patient presents with   Follow-up    Possible infection noticed last Friday    HPI:      Patricia Garner is a 67 y.o. [id number] whose LMP was No LMP recorded. Patient is postmenopausal., presents today for f/u from vaginal bx/ablation done with Dr. Sonia Side 03/27/23. Pt with hx of labial itching and bx showed VIN 1 01/28/23. Pt also with LS confirmed on bx with Dr. Sonia Side. Hasn't started clobetasol yet.  Pt states she started having LT labial discomfort Friday night and then noticed pus when wiping/blotting. Now LT side is swollen and more tender, with continued purulent d/c. No fevers. Taking showers to help with sx. Was given vaginal medicine to use post surg and has done it a couple times.    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   PVD (peripheral vascular disease) 11/06/2022   Prediabetes 11/09/2021   Iron deficiency 11/09/2021   Gastroesophageal reflux disease without esophagitis 11/09/2021   Mixed hyperlipidemia 11/09/2021   Class 3 severe obesity due to excess calories with body mass index (BMI) of 45.0 to 49.9 in adult 11/09/2021   Pain in joint of left knee 08/17/2021   Osteoporosis 04/12/2018   Essential (primary) hypertension 06/13/2015   Uncomplicated asthma 06/13/2015   Barrett's esophagus without dysplasia 06/13/2015   Osteoarthritis 06/13/2015    Past Surgical History:  Procedure Laterality Date   CERVICAL POLYPECTOMY N/A 03/27/2023   Procedure: CERVICAL POLYPECTOMY/ BIOPSY;  Surgeon: Artelia Laroche, MD;  Location: [facility] ORS;  Service: Gynecology;  Laterality: N/A;   CHOLECYSTECTOMY  1983   COLONOSCOPY WITH PROPOFOL N/A 08/28/2016   Procedure: COLONOSCOPY WITH PROPOFOL;  Surgeon: Christena Deem, MD;  Location: [facility] ENDOSCOPY;  Service: Endoscopy;  Laterality: N/A;   COLPOSCOPY N/A 03/27/2023   Procedure: COLPOSCOPY;   Surgeon: Artelia Laroche, MD;  Location: [facility] ORS;  Service: Gynecology;  Laterality: N/A;   ESOPHAGOGASTRODUODENOSCOPY (EGD) WITH PROPOFOL N/A 08/28/2016   Procedure: ESOPHAGOGASTRODUODENOSCOPY (EGD) WITH PROPOFOL;  Surgeon: Christena Deem, MD;  Location: [facility] ENDOSCOPY;  Service: Endoscopy;  Laterality: N/A;   ESOPHAGOGASTRODUODENOSCOPY (EGD) WITH PROPOFOL N/A 09/15/2019   Procedure: ESOPHAGOGASTRODUODENOSCOPY (EGD) WITH PROPOFOL;  Surgeon: Christena Deem, MD;  Location: [facility] ENDOSCOPY;  Service: Endoscopy;  Laterality: N/A;   TONSILLECTOMY  1969   VULVA /PERINEUM BIOPSY Bilateral 03/27/2023   Procedure: VULVAR BIOPSY - MULTIPLE;  Surgeon: Artelia Laroche, MD;  Location: [facility] ORS;  Service: Gynecology;  Laterality: Bilateral;   VULVAR LESION REMOVAL  03/27/2023   Procedure: ABLATION VULVAR INTRAEPITHELIAL LESION;  Surgeon: Artelia Laroche, MD;  Location: [facility] ORS;  Service: Gynecology;;    Family History  Problem Relation Age of Onset   Arthritis Mother    Heart disease Mother    Stroke Mother    Hypertension Mother    Alcohol abuse Father    Alcohol abuse Sister    Mental illness Sister    Stomach cancer Sister 67   Arthritis Maternal Grandmother    Stroke Maternal Grandmother    Colon cancer Maternal Grandfather 73   Alcohol abuse Brother    Drug abuse Brother    Breast cancer Neg Hx     Social History   Socioeconomic History   Marital status: Married    Spouse name: Not on file   Number of children: Not on file  Years of education: Not on file   Highest education level: Not on file  Occupational History   Not on file  Tobacco Use   Smoking status: Never   Smokeless tobacco: Never  Vaping Use   Vaping Use: Never used  Substance and Sexual Activity   Alcohol use: No    Alcohol/week: 0.0 standard drinks of alcohol   Drug use: Never   Sexual activity: Yes    Birth control/protection: Post-menopausal  Other Topics Concern   Not on file   Social History Narrative   Married and  Lives at home  with husband .   Social Determinants of Health   Financial Resource Strain: Not on file  Food Insecurity: Not on file  Transportation Needs: Not on file  Physical Activity: Not on file  Stress: Not on file  Social Connections: Not on file  Intimate Partner Violence: Not on file    Outpatient Medications Prior to Visit  Medication Sig Dispense Refill   albuterol (VENTOLIN HFA) 108 (90 Base) MCG/ACT inhaler INHALE 1 TO 2 PUFFS BY MOUTH EVERY 6 HOURS AS NEEDED FOR WHEEZING OR SHORTNESS OF BREATH 8 g 5   clobetasol (TEMOVATE) 0.05 % external solution Apply 1 Application topically 2 (two) times daily. Apply to aa's psoriasis in the scalp QD up to 5d/wk. Avoid applying to face, groin, and axilla. Use as directed. Long-term use can cause thinning of the skin. 50 mL 1   ferrous sulfate 325 (65 FE) MG EC tablet Take 325 mg by mouth as needed.     lisinopril-hydrochlorothiazide (ZESTORETIC) 20-25 MG tablet TAKE 1 TABLET BY MOUTH ONCE DAILY (OFFICE  VISIT  REQUIRED  FOR  FURTHER  REFILLS) 90 tablet 1   Moringa 500 MG CAPS Take by mouth.     pantoprazole (PROTONIX) 40 MG tablet Take 1 tablet (40 mg total) by mouth daily. 90 tablet 1   VITAMIN D, CHOLECALCIFEROL, PO Take 1 capsule by mouth daily at 6 (six) AM.     No facility-administered medications prior to visit.      ROS:  Review of Systems  Constitutional:  Negative for fever.  Gastrointestinal:  Negative for blood in stool, constipation, diarrhea, nausea and vomiting.  Genitourinary:  Positive for genital sores. Negative for dyspareunia, dysuria, flank pain, frequency, hematuria, urgency, vaginal bleeding, vaginal discharge and vaginal pain.  Musculoskeletal:  Negative for back pain.  Skin:  Negative for rash.   BREAST: No symptoms   OBJECTIVE:   Vitals:  BP 120/80   Ht 5\' 3"  (1.6 m)   Wt 258 lb (117 kg)   BMI 45.70 kg/m   Physical Exam Constitutional:       Appearance: Normal appearance.  Pulmonary:     Effort: Pulmonary effort is normal.  Genitourinary:    Labia:        Right: No rash, tenderness or lesion.        Left: Tenderness and lesion present.     Musculoskeletal:        General: Normal range of motion.  Neurological:     Mental Status: She is alert and oriented to person, place, and time.  Psychiatric:        Judgment: Judgment normal.     Assessment/Plan: Lichen sclerosus - Plan: clobetasol ointment (TEMOVATE) 0.05 %; Rx clobetasol oint to start after vaginal sites all healed. Then do QHS for 4 wks and RTO for f/u at that time.   Post-op pain - Plan: lidocaine (XYLOCAINE) 2 % solution;  Discussed case with Consuello MasseLauren Allen, NP at [facility]. She came over and eval pt. Did not see any signs of infection; most likely granulation tissue and healing. She recommended sitz baths, lidocaine gel (Rx eRxd), cool compresses. Reassurance. She will see pt in office 4/ 12/24. May need future tx for granulation tissue.    Meds ordered this encounter  Medications   clobetasol ointment (TEMOVATE) 0.05 %    Sig: Apply to affected area every night for 4 weeks, then every other day for 4 weeks and then twice a week for 4 weeks or until resolution.    Dispense:  45 g    Refill:  0    Order Specific Question:   Supervising Provider    Answer:   Hildred LaserHERRY, ANIKA [[id number]]   lidocaine (XYLOCAINE) 2 % solution    Sig: Apply as directed as needed for pain    Dispense:  20 mL    Refill:  1    Order Specific Question:   Supervising Provider    Answer:   Hildred LaserHERRY, ANIKA [[id number]]      Return if symptoms worsen or fail to improve.  Daymien Goth B. Amarii Amy, PA-C 04/09/2023 12:27 PM

## 2023-04-09 ENCOUNTER — Encounter: Payer: Self-pay | Admitting: Obstetrics and Gynecology

## 2023-04-09 NOTE — Patient Instructions (Signed)
I value your feedback and you entrusting us with your care. If you get a [facility] patient survey, I would appreciate you taking the time to let us know about your experience today. Thank you! ? ? ?

## 2023-04-12 ENCOUNTER — Encounter: Payer: Self-pay | Admitting: Nurse Practitioner

## 2023-04-12 ENCOUNTER — Inpatient Hospital Stay: Payer: PPO | Attending: Obstetrics and Gynecology | Admitting: Nurse Practitioner

## 2023-04-12 VITALS — BP 134/70 | HR 84 | Temp 96.8°F | Wt 255.0 lb

## 2023-04-12 DIAGNOSIS — N904 Leukoplakia of vulva: Secondary | ICD-10-CM | POA: Insufficient documentation

## 2023-04-12 DIAGNOSIS — N900 Mild vulvar dysplasia: Secondary | ICD-10-CM | POA: Diagnosis not present

## 2023-04-12 DIAGNOSIS — T8189XD Other complications of procedures, not elsewhere classified, subsequent encounter: Secondary | ICD-10-CM

## 2023-04-12 DIAGNOSIS — Z79890 Hormone replacement therapy (postmenopausal): Secondary | ICD-10-CM | POA: Diagnosis not present

## 2023-04-12 NOTE — Progress Notes (Signed)
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 female who is seen in consultation from Dr. Althea Grimmer, PA for evaluation of post op wound check.   She saw pcp earlier this week for concerns of possible infection. I assessed patient at [facility] office and did not feel wound was infected. Patient had increased burning with topical lidocaine and discontinued. She's been using peribottle for hygiene. Continues to have discomfort at times but improving over the course of the week.      Gynecologic History:  She is postmenopausal., presents today for VIN. At her annual exam she c/o  vulvar irritation which is chronic. Previously, Dr. Arvil Chaco gave her mycolog cream that helped, but sx persisted. Rx changed to lotrisone crm 4/18 with sx improvement. Sx flare with sweating. Sx improve with lotrisone cream BID for 2 wks but then recur after a few days now. She has had vulvar irritation issues and has been in a scratch/itch cycle for years. Recently she tried topical steroids but that made her symptoms worse.   She saw Dr. Patsy Lager who did a thorough exam with biopsy. See figure below with findings.   Genitourinary Comments: EXT EXAM WITH ERYTHEMA IN PATCHES BILAT LABIA MAJORA WITH A LITTLE SKIN BREAKDOWN     Right Labia: tenderness and lesions.     Right Labia: No rash.    Left Labia: tenderness and lesions.     Left Labia: No rash.    BIOPSY:  - Koilocytic atypia, consistent with low-grade squamous intraepithelial lesion (VIN1, low-grade dysplasia)    Last Pap: 07/01/20 Results were: no abnormalities /neg HPV DNA.     Last mammogram: 08/18/19  Results were: normal--routine follow-up in 12 months There is no FH of breast cancer. There is no FH of ovarian cancer. The patient does not do self-breast exams.   Colonoscopy: colonoscopy 2017; Repeat after 5 yrs per pt report. She has appt  4/24. 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   PVD (peripheral vascular disease) 11/06/2022   Prediabetes 11/09/2021   Iron deficiency 11/09/2021   Gastroesophageal reflux disease without esophagitis 11/09/2021   Mixed hyperlipidemia 11/09/2021   Class 3 severe obesity due to excess calories with body mass index (BMI) of 45.0 to 49.9 in adult 11/09/2021   Pain in joint of left knee 08/17/2021   Osteoporosis 04/12/2018   Essential (primary) hypertension 06/13/2015   Uncomplicated asthma 06/13/2015   Barrett's esophagus without dysplasia 06/13/2015   Osteoarthritis 06/13/2015    Past Medical History: Past Medical History:  Diagnosis Date   Allergy    Arthritis    Asthma    Barrett esophagus    Chicken pox    Chronic vaginitis    Dysplastic nevus 08/30/2016   upper back spinal, mild   GERD (gastroesophageal reflux disease)    History of colon polyps    Hyperlipidemia    Hypertension    Osteoporosis    Pre-diabetes    Psoriasis (a type of skin inflammation)    to the scalp   Vitamin D deficiency    Vulvar intraepithelial neoplasia (VIN) grade 1     Past Surgical History: Past Surgical History:  Procedure Laterality Date   CERVICAL POLYPECTOMY N/A 03/27/2023   Procedure: CERVICAL POLYPECTOMY/ BIOPSY;  Surgeon: Artelia Laroche, MD;  Location: [facility] ORS;  Service: Gynecology;  Laterality: N/A;   CHOLECYSTECTOMY  1983   COLONOSCOPY WITH PROPOFOL N/A 08/28/2016   Procedure: COLONOSCOPY WITH PROPOFOL;  Surgeon: Christena Deem, MD;  Location: [facility] ENDOSCOPY;  Service: Endoscopy;  Laterality: N/A;   COLPOSCOPY N/A 03/27/2023   Procedure: COLPOSCOPY;  Surgeon: Artelia Laroche, MD;  Location: [facility] ORS;  Service: Gynecology;  Laterality: N/A;   ESOPHAGOGASTRODUODENOSCOPY (EGD) WITH PROPOFOL N/A 08/28/2016   Procedure:  ESOPHAGOGASTRODUODENOSCOPY (EGD) WITH PROPOFOL;  Surgeon: Christena Deem, MD;  Location: [facility] ENDOSCOPY;  Service: Endoscopy;  Laterality: N/A;   ESOPHAGOGASTRODUODENOSCOPY (EGD) WITH PROPOFOL N/A 09/15/2019   Procedure: ESOPHAGOGASTRODUODENOSCOPY (EGD) WITH PROPOFOL;  Surgeon: Christena Deem, MD;  Location: [facility] ENDOSCOPY;  Service: Endoscopy;  Laterality: N/A;   TONSILLECTOMY  1969   VULVA /PERINEUM BIOPSY Bilateral 03/27/2023   Procedure: VULVAR BIOPSY - MULTIPLE;  Surgeon: Artelia Laroche, MD;  Location: [facility] ORS;  Service: Gynecology;  Laterality: Bilateral;   VULVAR LESION REMOVAL  03/27/2023   Procedure: ABLATION VULVAR INTRAEPITHELIAL LESION;  Surgeon: Artelia Laroche, MD;  Location: [facility] ORS;  Service: Gynecology;;    Past Gynecologic History:  As per HPI  OB History:  OB History  Gravida Para Term Preterm AB Living  SAB IAB Ectopic Multiple Live Births  1       2    # Outcome Date GA Lbr Len/2nd Weight Sex Delivery Anes PTL Lv  3 Term 01/04/81 [redacted]w[redacted]d  8 lb 4.8 oz (3.765 kg) F Vag-Spont     2 Term 08/20/79 [redacted]w[redacted]d  6 lb 11.2 oz (3.039 kg) F Vag-Spont     1 SAB             Family History: Family History  Problem Relation Age of Onset   Arthritis Mother    Heart disease Mother    Stroke Mother    Hypertension Mother    Alcohol abuse Father    Alcohol abuse Sister    Mental illness Sister    Stomach cancer Sister 13   Arthritis Maternal Grandmother    Stroke Maternal Grandmother    Colon cancer Maternal Grandfather 61   Alcohol abuse Brother    Drug abuse Brother    Breast cancer Neg Hx     Social History: Social History   Socioeconomic History   Marital status: Married    Spouse name: Not on file   Number of children: Not on file   Years of education: Not on file   Highest education level: Not on file  Occupational History   Not on file  Tobacco Use   Smoking status: Never   Smokeless tobacco: Never  Vaping Use   Vaping  Use: Never used  Substance and Sexual Activity   Alcohol use: No    Alcohol/week: 0.0 standard drinks of alcohol   Drug use: Never   Sexual activity: Yes    Birth control/protection: Post-menopausal  Other Topics Concern   Not on file  Social History Narrative   Married and  Lives at home  with husband .   Social Determinants of Health   Financial Resource Strain: Not on file  Food Insecurity: Not on file  Transportation Needs: Not on file  Physical Activity: Not on file  Stress: Not on file  Social Connections: Not on file  Intimate Partner Violence: Not on file    Allergies: Allergies  Allergen Reactions  Bactrim [Sulfamethoxazole-Trimethoprim] Nausea Only   Codeine Nausea And Vomiting   Hydrocodone Hives   Lipitor [Atorvastatin] Other (See Comments)    Myalgia    Medrol [Methylprednisolone] Nausea Only    Current Medications: Current Outpatient Medications  Medication Sig Dispense Refill   albuterol (VENTOLIN HFA) 108 (90 Base) MCG/ACT inhaler INHALE 1 TO 2 PUFFS BY MOUTH EVERY 6 HOURS AS NEEDED FOR WHEEZING OR SHORTNESS OF BREATH 8 g 5   clobetasol (TEMOVATE) 0.05 % external solution Apply 1 Application topically 2 (two) times daily. Apply to aa's psoriasis in the scalp QD up to 5d/wk. Avoid applying to face, groin, and axilla. Use as directed. Long-term use can cause thinning of the skin. 50 mL 1   clobetasol ointment (TEMOVATE) 0.05 % Apply to affected area every night for 4 weeks, then every other day for 4 weeks and then twice a week for 4 weeks or until resolution. 45 g 0   ferrous sulfate 325 (65 FE) MG EC tablet Take 325 mg by mouth as needed.     lidocaine (XYLOCAINE) 2 % solution Apply as directed as needed for pain 20 mL 1   lisinopril-hydrochlorothiazide (ZESTORETIC) 20-25 MG tablet TAKE 1 TABLET BY MOUTH ONCE DAILY (OFFICE  VISIT  REQUIRED  FOR  FURTHER  REFILLS) 90 tablet 1   Moringa 500 MG CAPS Take by mouth.     pantoprazole (PROTONIX) 40 MG tablet  Take 1 tablet (40 mg total) by mouth daily. 90 tablet 1   VITAMIN D, CHOLECALCIFEROL, PO Take 1 capsule by mouth daily at 6 (six) AM.     No current facility-administered medications for this visit.    Review of Systems General: negative for fevers, changes in weight or night sweats Ob/Gyn:  as per HPI   Objective:  Physical Examination:  There were no vitals taken for this visit.   ECOG Performance Status: 0 - Asymptomatic  GENERAL: Patient is a well appearing female in no acute distress Pelvic chaperoned by nurse CMA Vulva:    04/08/23  04/12/23     Lab Review No labs on site today   Radiologic Imaging: N/A    Assessment:  HARRIETT ANTILL is a 67 y.o. 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 Sonia Side. She presents to clinic for wound check. Ablated lesion appears to be healing well with evidence of granulation tissue. Biopsy sites have healed nicely.   Medical co-morbidities complicating care: HTN and obesity.  Plan:   Problem List Items Addressed This Visit   None Visit Diagnoses     Delayed surgical wound healing, subsequent encounter    -  Primary      No evidence of infection. We again reviewed best vulvar hygiene practices. Recommend gentle removal of slough daily. Hold on mechanical or chemical debridement for now. May take 5-6 weeks for wound to heal.   Will plan to see her in 1 week for wound recheck. I updated Dr Sonia Side as well who is scheduled to see patient 5/22.   Consuello Masse, DNP, AGNP-C, AOCNP [facility] at [facility] [phone] (clinic)

## 2023-04-19 ENCOUNTER — Inpatient Hospital Stay (HOSPITAL_BASED_OUTPATIENT_CLINIC_OR_DEPARTMENT_OTHER): Payer: PPO | Admitting: Nurse Practitioner

## 2023-04-19 ENCOUNTER — Encounter: Payer: Self-pay | Admitting: Nurse Practitioner

## 2023-04-19 ENCOUNTER — Other Ambulatory Visit: Payer: Self-pay

## 2023-04-19 VITALS — BP 142/62 | HR 70 | Temp 97.0°F | Resp 20 | Ht 63.0 in | Wt 262.6 lb

## 2023-04-19 DIAGNOSIS — T8189XD Other complications of procedures, not elsewhere classified, subsequent encounter: Secondary | ICD-10-CM | POA: Diagnosis not present

## 2023-04-19 DIAGNOSIS — N900 Mild vulvar dysplasia: Secondary | ICD-10-CM | POA: Diagnosis not present

## 2023-04-19 NOTE — Progress Notes (Signed)
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 female who is seen in consultation from Dr. Althea Grimmer, PA for evaluation of post op wound check.   She feels that pain is improving but has episodes of sharp pain. Continues hygiene practices. Her grandson was diagnosed with leukemia and is tolerating treatments at [facility] well.     Gynecologic History:  She is postmenopausal., presents today for VIN. At her annual exam she c/o  vulvar irritation which is chronic. Previously, Dr. Arvil Chaco gave her mycolog cream that helped, but sx persisted. Rx changed to lotrisone crm 4/18 with sx improvement. Sx flare with sweating. Sx improve with lotrisone cream BID for 2 wks but then recur after a few days now. She has had vulvar irritation issues and has been in a scratch/itch cycle for years. Recently she tried topical steroids but that made her symptoms worse.   She saw Dr. Patsy Lager who did a thorough exam with biopsy. See figure below with findings.   Genitourinary Comments: EXT EXAM WITH ERYTHEMA IN PATCHES BILAT LABIA MAJORA WITH A LITTLE SKIN BREAKDOWN     Right Labia: tenderness and lesions.     Right Labia: No rash.    Left Labia: tenderness and lesions.     Left Labia: No rash.    BIOPSY:  - Koilocytic atypia, consistent with low-grade squamous intraepithelial lesion (VIN1, low-grade dysplasia)    Last Pap: 07/01/20 Results were: no abnormalities /neg HPV DNA.     Last mammogram: 08/18/19  Results were: normal--routine follow-up in 12 months There is no FH of breast cancer. There is no FH of ovarian cancer. The patient does not do self-breast exams.   Colonoscopy: colonoscopy 2017; Repeat after 5 yrs per pt report. She has appt 4/24. DEXA ordered with PCP   Problem List: Patient Active Problem List   Diagnosis Date Noted   Cervical lesion 03/27/2023   VIN I (vulvar intraepithelial  neoplasia I) 02/20/2023   PVD (peripheral vascular disease) 11/06/2022   Prediabetes 11/09/2021   Iron deficiency 11/09/2021   Gastroesophageal reflux disease without esophagitis 11/09/2021   Mixed hyperlipidemia 11/09/2021   Class 3 severe obesity due to excess calories with body mass index (BMI) of 45.0 to 49.9 in adult 11/09/2021   Pain in joint of left knee 08/17/2021   Osteoporosis 04/12/2018   Essential (primary) hypertension 06/13/2015   Uncomplicated asthma 06/13/2015   Barrett's esophagus without dysplasia 06/13/2015   Osteoarthritis 06/13/2015    Past Medical History: Past Medical History:  Diagnosis Date   Allergy    Arthritis    Asthma    Barrett esophagus    Chicken pox    Chronic vaginitis    Dysplastic nevus 08/30/2016   upper back spinal, mild   GERD (gastroesophageal reflux disease)    History of colon polyps    Hyperlipidemia    Hypertension    Osteoporosis    Pre-diabetes    Psoriasis (a type of skin inflammation)    to the scalp   Vitamin D deficiency    Vulvar intraepithelial neoplasia (VIN) grade 1     Past Surgical History: Past Surgical History:  Procedure Laterality Date   CERVICAL POLYPECTOMY N/A 03/27/2023   Procedure: CERVICAL POLYPECTOMY/ BIOPSY;  Surgeon: Artelia Laroche, MD;  Location: [facility] ORS;  Service: Gynecology;  Laterality: N/A;   CHOLECYSTECTOMY  1983   COLONOSCOPY WITH PROPOFOL  N/A 08/28/2016   Procedure: COLONOSCOPY WITH PROPOFOL;  Surgeon: Christena Deem, MD;  Location: [facility] ENDOSCOPY;  Service: Endoscopy;  Laterality: N/A;   COLPOSCOPY N/A 03/27/2023   Procedure: COLPOSCOPY;  Surgeon: Artelia Laroche, MD;  Location: [facility] ORS;  Service: Gynecology;  Laterality: N/A;   ESOPHAGOGASTRODUODENOSCOPY (EGD) WITH PROPOFOL N/A 08/28/2016   Procedure: ESOPHAGOGASTRODUODENOSCOPY (EGD) WITH PROPOFOL;  Surgeon: Christena Deem, MD;  Location: [facility] ENDOSCOPY;  Service: Endoscopy;  Laterality: N/A;    ESOPHAGOGASTRODUODENOSCOPY (EGD) WITH PROPOFOL N/A 09/15/2019   Procedure: ESOPHAGOGASTRODUODENOSCOPY (EGD) WITH PROPOFOL;  Surgeon: Christena Deem, MD;  Location: [facility] ENDOSCOPY;  Service: Endoscopy;  Laterality: N/A;   TONSILLECTOMY  1969   VULVA /PERINEUM BIOPSY Bilateral 03/27/2023   Procedure: VULVAR BIOPSY - MULTIPLE;  Surgeon: Artelia Laroche, MD;  Location: [facility] ORS;  Service: Gynecology;  Laterality: Bilateral;   VULVAR LESION REMOVAL  03/27/2023   Procedure: ABLATION VULVAR INTRAEPITHELIAL LESION;  Surgeon: Artelia Laroche, MD;  Location: [facility] ORS;  Service: Gynecology;;    Past Gynecologic History:  As per HPI  OB History:  OB History  Gravida Para Term Preterm AB Living  SAB IAB Ectopic Multiple Live Births  1       2    # Outcome Date GA Lbr Len/2nd Weight Sex Delivery Anes PTL Lv  3 Term 01/04/81 [redacted]w[redacted]d  8 lb 4.8 oz (3.765 kg) F Vag-Spont     2 Term 08/20/79 [redacted]w[redacted]d  6 lb 11.2 oz (3.039 kg) F Vag-Spont     1 SAB             Family History: Family History  Problem Relation Age of Onset   Arthritis Mother    Heart disease Mother    Stroke Mother    Hypertension Mother    Alcohol abuse Father    Alcohol abuse Sister    Mental illness Sister    Stomach cancer Sister 63   Arthritis Maternal Grandmother    Stroke Maternal Grandmother    Colon cancer Maternal Grandfather 67   Alcohol abuse Brother    Drug abuse Brother    Breast cancer Neg Hx     Social History: Social History   Socioeconomic History   Marital status: Married    Spouse name: Not on file   Number of children: Not on file   Years of education: Not on file   Highest education level: Not on file  Occupational History   Not on file  Tobacco Use   Smoking status: Never   Smokeless tobacco: Never  Vaping Use   Vaping Use: Never used  Substance and Sexual Activity   Alcohol use: No    Alcohol/week: 0.0 standard drinks of alcohol   Drug use: Never   Sexual  activity: Yes    Birth control/protection: Post-menopausal  Other Topics Concern   Not on file  Social History Narrative   Married and  Lives at home  with husband .   Social Determinants of Health   Financial Resource Strain: Not on file  Food Insecurity: Not on file  Transportation Needs: Not on file  Physical Activity: Not on file  Stress: Not on file  Social Connections: Not on file  Intimate Partner Violence: Not on file    Allergies: Allergies  Allergen Reactions   Bactrim [Sulfamethoxazole-Trimethoprim] Nausea Only   Codeine Nausea And Vomiting   Hydrocodone Hives   Lipitor [Atorvastatin] Other (See Comments)  Myalgia    Medrol [Methylprednisolone] Nausea Only    Current Medications: Current Outpatient Medications  Medication Sig Dispense Refill   albuterol (VENTOLIN HFA) 108 (90 Base) MCG/ACT inhaler INHALE 1 TO 2 PUFFS BY MOUTH EVERY 6 HOURS AS NEEDED FOR WHEEZING OR SHORTNESS OF BREATH 8 g 5   clobetasol (TEMOVATE) 0.05 % external solution Apply 1 Application topically 2 (two) times daily. Apply to aa's psoriasis in the scalp QD up to 5d/wk. Avoid applying to face, groin, and axilla. Use as directed. Long-term use can cause thinning of the skin. 50 mL 1   clobetasol ointment (TEMOVATE) 0.05 % Apply to affected area every night for 4 weeks, then every other day for 4 weeks and then twice a week for 4 weeks or until resolution. 45 g 0   ferrous sulfate 325 (65 FE) MG EC tablet Take 325 mg by mouth as needed.     lisinopril-hydrochlorothiazide (ZESTORETIC) 20-25 MG tablet TAKE 1 TABLET BY MOUTH ONCE DAILY (OFFICE  VISIT  REQUIRED  FOR  FURTHER  REFILLS) 90 tablet 1   Moringa 500 MG CAPS Take by mouth.     pantoprazole (PROTONIX) 40 MG tablet Take 1 tablet (40 mg total) by mouth daily. 90 tablet 1   VITAMIN D, CHOLECALCIFEROL, PO Take 1 capsule by mouth daily at 6 (six) AM.     No current facility-administered medications for this visit.    Review of  Systems General: negative for fevers, changes in weight or night sweats Ob/Gyn:  as per HPI   Objective:  Physical Examination:  BP (!) 142/62   Pulse 70   Temp (!) 97 F (36.1 C) (Tympanic)   Resp 20   Ht  (1.6 m)   Wt 262 lb 9.6 oz (119.1 kg)   BMI 46.52 kg/m    ECOG Performance Status: 0 - Asymptomatic  GENERAL: Patient is a well appearing female in no acute distress Pelvic chaperoned by nurse CMA Vulva:    04/08/23  04/12/23  04/19/23- wound appears to be healing with evidence of healing margins. No concerns for infection.    Lab Review No labs on site today   Radiologic Imaging: N/A    Assessment:  GUSTA MARKSBERRY is a 67 y.o. 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 Sonia Side. She presents to clinic for wound check. Ablated lesion appears to be healing well with evidence of granulation tissue. Biopsy sites have healed nicely.   Medical co-morbidities complicating care: HTN and obesity.  Plan:   Problem List Items Addressed This Visit   None  No evidence of infection. We again reviewed best vulvar hygiene practices. Recommend gentle removal of slough daily. Hold on mechanical or chemical debridement for now. May take 5-8 weeks for wound to heal but is healing as expected.   Will plan to see her back at her post op visit on 5/22 as scheduled but if she develops worsening symptoms, she can notify clinic to be seen sooner.   Consuello Masse, DNP, AGNP-C, AOCNP [facility] at [facility] [phone] (clinic)

## 2023-04-25 DIAGNOSIS — K2270 Barrett's esophagus without dysplasia: Secondary | ICD-10-CM | POA: Diagnosis not present

## 2023-04-25 DIAGNOSIS — R1900 Intra-abdominal and pelvic swelling, mass and lump, unspecified site: Secondary | ICD-10-CM | POA: Diagnosis not present

## 2023-04-25 DIAGNOSIS — Z86010 Personal history of colonic polyps: Secondary | ICD-10-CM | POA: Diagnosis not present

## 2023-04-26 ENCOUNTER — Other Ambulatory Visit: Payer: Self-pay | Admitting: Gastroenterology

## 2023-04-26 DIAGNOSIS — R1900 Intra-abdominal and pelvic swelling, mass and lump, unspecified site: Secondary | ICD-10-CM

## 2023-05-07 ENCOUNTER — Ambulatory Visit (INDEPENDENT_AMBULATORY_CARE_PROVIDER_SITE_OTHER): Payer: PPO | Admitting: Internal Medicine

## 2023-05-07 ENCOUNTER — Encounter: Payer: Self-pay | Admitting: Internal Medicine

## 2023-05-07 VITALS — BP 126/72 | HR 81 | Temp 96.8°F | Ht 63.0 in | Wt 260.0 lb

## 2023-05-07 DIAGNOSIS — Z0001 Encounter for general adult medical examination with abnormal findings: Secondary | ICD-10-CM

## 2023-05-07 DIAGNOSIS — E782 Mixed hyperlipidemia: Secondary | ICD-10-CM

## 2023-05-07 DIAGNOSIS — Z6841 Body Mass Index (BMI) 40.0 and over, adult: Secondary | ICD-10-CM | POA: Diagnosis not present

## 2023-05-07 DIAGNOSIS — R7303 Prediabetes: Secondary | ICD-10-CM | POA: Diagnosis not present

## 2023-05-07 DIAGNOSIS — I10 Essential (primary) hypertension: Secondary | ICD-10-CM | POA: Diagnosis not present

## 2023-05-07 MED ORDER — PANTOPRAZOLE SODIUM 40 MG PO TBEC: 40.0000 mg | DELAYED_RELEASE_TABLET | Freq: Every day | ORAL | 1 refills | Status: DC

## 2023-05-07 MED ORDER — LISINOPRIL-HYDROCHLOROTHIAZIDE 20-25 MG PO TABS: ORAL_TABLET | ORAL | 1 refills | Status: DC

## 2023-05-07 NOTE — Patient Instructions (Signed)
Health Maintenance for Postmenopausal Women Menopause is a normal process in which your ability to get pregnant comes to an end. This process happens slowly over many months or years, usually between the ages of 48 and 55. Menopause is complete when you have missed your menstrual period for 12 months. It is important to talk with your health care provider about some of the most common conditions that affect women after menopause (postmenopausal women). These include heart disease, cancer, and bone loss (osteoporosis). Adopting a healthy lifestyle and getting preventive care can help to promote your health and wellness. The actions you take can also lower your chances of developing some of these common conditions. What are the signs and symptoms of menopause? During menopause, you may have the following symptoms: Hot flashes. These can be moderate or severe. Night sweats. Decrease in sex drive. Mood swings. Headaches. Tiredness (fatigue). Irritability. Memory problems. Problems falling asleep or staying asleep. Talk with your health care provider about treatment options for your symptoms. Do I need hormone replacement therapy? Hormone replacement therapy is effective in treating symptoms that are caused by menopause, such as hot flashes and night sweats. Hormone replacement carries certain risks, especially as you become older. If you are thinking about using estrogen or estrogen with progestin, discuss the benefits and risks with your health care provider. How can I reduce my risk for heart disease and stroke? The risk of heart disease, heart attack, and stroke increases as you age. One of the causes may be a change in the body's hormones during menopause. This can affect how your body uses dietary fats, triglycerides, and cholesterol. Heart attack and stroke are medical emergencies. There are many things that you can do to help prevent heart disease and stroke. Watch your blood pressure High  blood pressure causes heart disease and increases the risk of stroke. This is more likely to develop in people who have high blood pressure readings or are overweight. Have your blood pressure checked: Every 3-5 years if you are 18-39 years of age. Every year if you are 40 years old or older. Eat a healthy diet  Eat a diet that includes plenty of vegetables, fruits, low-fat dairy products, and lean protein. Do not eat a lot of foods that are high in solid fats, added sugars, or sodium. Get regular exercise Get regular exercise. This is one of the most important things you can do for your health. Most adults should: Try to exercise for at least 150 minutes each week. The exercise should increase your heart rate and make you sweat (moderate-intensity exercise). Try to do strengthening exercises at least twice each week. Do these in addition to the moderate-intensity exercise. Spend less time sitting. Even light physical activity can be beneficial. Other tips Work with your health care provider to achieve or maintain a healthy weight. Do not use any products that contain nicotine or tobacco. These products include cigarettes, chewing tobacco, and vaping devices, such as e-cigarettes. If you need help quitting, ask your health care provider. Know your numbers. Ask your health care provider to check your cholesterol and your blood sugar (glucose). Continue to have your blood tested as directed by your health care provider. Do I need screening for cancer? Depending on your health history and family history, you may need to have cancer screenings at different stages of your life. This may include screening for: Breast cancer. Cervical cancer. Lung cancer. Colorectal cancer. What is my risk for osteoporosis? After menopause, you may be   at increased risk for osteoporosis. Osteoporosis is a condition in which bone destruction happens more quickly than new bone creation. To help prevent osteoporosis or  the bone fractures that can happen because of osteoporosis, you may take the following actions: If you are 19-50 years old, get at least 1,000 mg of calcium and at least 600 international units (IU) of vitamin D per day. If you are older than age 50 but younger than age 70, get at least 1,200 mg of calcium and at least 600 international units (IU) of vitamin D per day. If you are older than age 70, get at least 1,200 mg of calcium and at least 800 international units (IU) of vitamin D per day. Smoking and drinking excessive alcohol increase the risk of osteoporosis. Eat foods that are rich in calcium and vitamin D, and do weight-bearing exercises several times each week as directed by your health care provider. How does menopause affect my mental health? Depression may occur at any age, but it is more common as you become older. Common symptoms of depression include: Feeling depressed. Changes in sleep patterns. Changes in appetite or eating patterns. Feeling an overall lack of motivation or enjoyment of activities that you previously enjoyed. Frequent crying spells. Talk with your health care provider if you think that you are experiencing any of these symptoms. General instructions See your health care provider for regular wellness exams and vaccines. This may include: Scheduling regular health, dental, and eye exams. Getting and maintaining your vaccines. These include: Influenza vaccine. Get this vaccine each year before the flu season begins. Pneumonia vaccine. Shingles vaccine. Tetanus, diphtheria, and pertussis (Tdap) booster vaccine. Your health care provider may also recommend other immunizations. Tell your health care provider if you have ever been abused or do not feel safe at home. Summary Menopause is a normal process in which your ability to get pregnant comes to an end. This condition causes hot flashes, night sweats, decreased interest in sex, mood swings, headaches, or lack  of sleep. Treatment for this condition may include hormone replacement therapy. Take actions to keep yourself healthy, including exercising regularly, eating a healthy diet, watching your weight, and checking your blood pressure and blood sugar levels. Get screened for cancer and depression. Make sure that you are up to date with all your vaccines. This information is not intended to replace advice given to you by your health care provider. Make sure you discuss any questions you have with your health care provider. Document Revised: 05/08/2021 Document Reviewed: 05/08/2021 Elsevier Patient Education  2023 [employer]  

## 2023-05-07 NOTE — Progress Notes (Signed)
Subjective:    Patient ID: Patricia Garner, female    DOB: May 29, 1956, 67 y.o.   MRN: [id number]  HPI  Patient presents to clinic today for her annual exam.  Flu: never Tetanus: > 10 years ago COVID: never Pneumovax: never Prevnar: never Shingrix: never Pap smear: 07/2019 Mammogram: 03/2023 Bone density: 03/2023 Colon screening: 07/2016, scheduled 07/30/23 Vision screening: as needed Dentist: annually  Diet: She does eat meat. She consumes fruits and veggies. She does eat some fried foods. She drinks mostly water. Exercise: Gym 3 x week  Review of Systems     Past Medical History:  Diagnosis Date   Allergy    Arthritis    Asthma    Barrett esophagus    Chicken pox    Chronic vaginitis    Dysplastic nevus 08/30/2016   upper back spinal, mild   GERD (gastroesophageal reflux disease)    History of colon polyps    Hyperlipidemia    Hypertension    Osteoporosis    Pre-diabetes    Psoriasis (a type of skin inflammation)    to the scalp   Vitamin D deficiency    Vulvar intraepithelial neoplasia (VIN) grade 1     Current Outpatient Medications  Medication Sig Dispense Refill   albuterol (VENTOLIN HFA) 108 (90 Base) MCG/ACT inhaler INHALE 1 TO 2 PUFFS BY MOUTH EVERY 6 HOURS AS NEEDED FOR WHEEZING OR SHORTNESS OF BREATH 8 g 5   clobetasol (TEMOVATE) 0.05 % external solution Apply 1 Application topically 2 (two) times daily. Apply to aa's psoriasis in the scalp QD up to 5d/wk. Avoid applying to face, groin, and axilla. Use as directed. Long-term use can cause thinning of the skin. 50 mL 1   clobetasol ointment (TEMOVATE) 0.05 % Apply to affected area every night for 4 weeks, then every other day for 4 weeks and then twice a week for 4 weeks or until resolution. 45 g 0   ferrous sulfate 325 (65 FE) MG EC tablet Take 325 mg by mouth as needed.     lisinopril-hydrochlorothiazide (ZESTORETIC) 20-25 MG tablet TAKE 1 TABLET BY MOUTH ONCE DAILY (OFFICE  VISIT  REQUIRED  FOR  FURTHER   REFILLS) 90 tablet 1   [place]a 500 MG CAPS Take by mouth.     pantoprazole (PROTONIX) 40 MG tablet Take 1 tablet (40 mg total) by mouth daily. 90 tablet 1   VITAMIN D, CHOLECALCIFEROL, PO Take 1 capsule by mouth daily at 6 (six) AM.     No current facility-administered medications for this visit.    Allergies  Allergen Reactions   Bactrim [Sulfamethoxazole-Trimethoprim] Nausea Only   Codeine Nausea And Vomiting   Hydrocodone Hives   Lipitor [Atorvastatin] Other (See Comments)    Myalgia    Medrol [Methylprednisolone] Nausea Only    Family History  Problem Relation Age of Onset   Arthritis Mother    Heart disease Mother    Stroke Mother    Hypertension Mother    Alcohol abuse Father    Alcohol abuse Sister    Mental illness Sister    Stomach cancer Sister 40   Arthritis Maternal Grandmother    Stroke Maternal Grandmother    Colon cancer Maternal Grandfather 57   Alcohol abuse Brother    Drug abuse Brother    Breast cancer Neg Hx     Social History   Socioeconomic History   Marital status: Married    Spouse name: Not on file   Number of children:  Not on file   Years of education: Not on file   Highest education level: Not on file  Occupational History   Not on file  Tobacco Use   Smoking status: Never   Smokeless tobacco: Never  Vaping Use   Vaping Use: Never used  Substance and Sexual Activity   Alcohol use: No    Alcohol/week: 0.0 standard drinks of alcohol   Drug use: Never   Sexual activity: Yes    Birth control/protection: Post-menopausal  Other Topics Concern   Not on file  Social History Narrative   Married and  Lives at home  with husband .   Social Determinants of Health   Financial Resource Strain: Not on file  Food Insecurity: Not on file  Transportation Needs: Not on file  Physical Activity: Not on file  Stress: Not on file  Social Connections: Not on file  Intimate Partner Violence: Not on file     Constitutional: Denies fever,  malaise, fatigue, headache or abrupt weight changes.  HEENT: Denies eye pain, eye redness, ear pain, ringing in the ears, wax buildup, runny nose, nasal congestion, bloody nose, or sore throat. Respiratory: Denies difficulty breathing, shortness of breath, cough or sputum production.   Cardiovascular: Denies chest pain, chest tightness, palpitations or swelling in the hands or feet.  Gastrointestinal: Denies abdominal pain, bloating, constipation, diarrhea or blood in the stool.  GU: Denies urgency, frequency, pain with urination, burning sensation, blood in urine, odor or discharge. Musculoskeletal: Pt reports joint pain in knee. Denies decrease in range of motion, difficulty with gait, muscle pain or joint swelling.  Skin: Denies redness, rashes, lesions or ulcercations.  Neurological: Denies dizziness, difficulty with memory, difficulty with speech or problems with balance and coordination.  Psych: Denies anxiety, depression, SI/HI.  No other specific complaints in a complete review of systems (except as listed in HPI above).  Objective:   Physical Exam  BP 126/72 (BP Location: Left Arm, Patient Position: Sitting, Cuff Size: Large)   Pulse 81   Temp (!) 96.8 F (36 C) (Temporal)   Ht 5\' 3"  (1.6 m)   Wt 260 lb (117.9 kg)   SpO2 98%   BMI 46.06 kg/m   Wt Readings from Last 3 Encounters:  04/19/23 262 lb 9.6 oz (119.1 kg)  04/12/23 255 lb (115.7 kg)  04/08/23 258 lb (117 kg)    General: Appears her stated age, obese, in NAD. Skin: Warm, dry and intact.  HEENT: Head: normal shape and size; Eyes: sclera white, no icterus, conjunctiva pink, PERRLA and EOMs intact;  Neck:  Neck supple, trachea midline. No masses, lumps or thyromegaly present.  Cardiovascular: Normal rate and rhythm. S1,S2 noted.  No murmur, rubs or gallops noted. No JVD or BLE edema. No carotid bruits noted. Pulmonary/Chest: Normal effort and positive vesicular breath sounds. No respiratory distress. No wheezes,  rales or ronchi noted.  Abdomen: Normal bowel sounds.  Musculoskeletal: Strength 5/5 BUE, strength 5/5 RLE, 4/5 LLE.  Gait steady without device. Neurological: Alert and oriented. Cranial nerves II-XII grossly intact. Coordination normal.  Psychiatric: Mood and affect normal. Behavior is normal. Judgment and thought content normal.     BMET    Component Value Date/Time   NA 141 11/06/2022 0904   K 4.2 11/06/2022 0904   CL 100 11/06/2022 0904   CO2 30 11/06/2022 0904   GLUCOSE 109 (H) 11/06/2022 0904   BUN 16 11/06/2022 0904   CREATININE 0.66 11/06/2022 0904   CALCIUM 10.6 (H) 11/06/2022 1610  Lipid Panel     Component Value Date/Time   CHOL 257 (H) 11/06/2022 0904   TRIG 219 (H) 11/06/2022 0904   HDL 48 (L) 11/06/2022 0904   CHOLHDL 5.4 (H) 11/06/2022 0904   VLDL 30.6 09/01/2020 1440   LDLCALC 170 (H) 11/06/2022 0904    CBC    Component Value Date/Time   WBC 7.8 11/06/2022 0904   RBC 3.95 11/06/2022 0904   HGB 12.4 11/06/2022 0904   HCT 36.8 11/06/2022 0904   PLT 341 11/06/2022 0904   MCV 93.2 11/06/2022 0904   MCH 31.4 11/06/2022 0904   MCHC 33.7 11/06/2022 0904   RDW 12.4 11/06/2022 0904    Hgb A1C Lab Results  Component Value Date   HGBA1C 6.3 (H) 11/06/2022          Assessment & Plan:   Preventative Health Maintenance:  Encouraged her to get a flu shot in the fall She declines tetanus Encouraged her to get her covid vaccine She declines Pneumovax or Prevnar 20 Discussed Shingrix vaccine, she will check coverage with her insurance company and schedule a visit if she would like to have this done Pap smear UTD Mammogram UTD Bone density UTD Colon screening has already been scheduled Encouraged her to consume a balanced diet and exercise regimen Advised her to see an eye doctor and dentist annually Will check CBC, CMET, Lipid, A1C today  RTC in 6 months, follow up chronic conditions Nicki Reaper, NP

## 2023-05-07 NOTE — Assessment & Plan Note (Signed)
Encourage diet and exercise for weight loss 

## 2023-05-08 LAB — COMPLETE METABOLIC PANEL WITH GFR
AG Ratio: 2 (calc) (ref 1.0–2.5)
ALT: 14 U/L (ref 6–29)
AST: 11 U/L (ref 10–35)
Albumin: 4.5 g/dL (ref 3.6–5.1)
Alkaline phosphatase (APISO): 73 U/L (ref 37–153)
BUN: 22 mg/dL (ref 7–25)
CO2: 28 mmol/L (ref 20–32)
Calcium: 10 mg/dL (ref 8.6–10.4)
Chloride: 101 mmol/L (ref 98–110)
Creat: 0.69 mg/dL (ref 0.50–1.05)
Globulin: 2.2 g/dL (calc) (ref 1.9–3.7)
Glucose, Bld: 109 mg/dL (ref 65–139)
Potassium: 4.2 mmol/L (ref 3.5–5.3)
Sodium: 138 mmol/L (ref 135–146)
Total Bilirubin: 0.4 mg/dL (ref 0.2–1.2)
Total Protein: 6.7 g/dL (ref 6.1–8.1)
eGFR: 96 mL/min/{1.73_m2} (ref >=60)

## 2023-05-08 LAB — HEMOGLOBIN A1C
Hgb A1c MFr Bld: 6.2 % of total Hgb — ABNORMAL HIGH (ref <5.7)
Mean Plasma Glucose: 131 mg/dL
eAG (mmol/L): 7.3 mmol/L

## 2023-05-08 LAB — LIPID PANEL
Cholesterol: 248 mg/dL — ABNORMAL HIGH (ref <200)
HDL: 44 mg/dL — ABNORMAL LOW (ref >=50)
LDL Cholesterol (Calc): 166 mg/dL (calc) — ABNORMAL HIGH
Non-HDL Cholesterol (Calc): 204 mg/dL (calc) — ABNORMAL HIGH (ref <130)
Total CHOL/HDL Ratio: 5.6 (calc) — ABNORMAL HIGH (ref <5.0)
Triglycerides: 213 mg/dL — ABNORMAL HIGH (ref <150)

## 2023-05-08 LAB — CBC
HCT: 36.3 % (ref 35.0–45.0)
Hemoglobin: 11.7 g/dL (ref 11.7–15.5)
MCH: 29.8 pg (ref 27.0–33.0)
MCHC: 32.2 g/dL (ref 32.0–36.0)
MCV: 92.4 fL (ref 80.0–100.0)
MPV: 10.9 fL (ref 7.5–12.5)
Platelets: 284 10*3/uL (ref 140–400)
RBC: 3.93 10*6/uL (ref 3.80–5.10)
RDW: 12.2 % (ref 11.0–15.0)
WBC: 7.9 10*3/uL (ref 3.8–10.8)

## 2023-05-22 ENCOUNTER — Inpatient Hospital Stay: Payer: PPO | Attending: Obstetrics and Gynecology | Admitting: Obstetrics and Gynecology

## 2023-05-22 VITALS — BP 142/67 | HR 83 | Temp 96.7°F | Resp 20 | Wt 263.4 lb

## 2023-05-22 DIAGNOSIS — N904 Leukoplakia of vulva: Secondary | ICD-10-CM

## 2023-05-22 DIAGNOSIS — N903 Dysplasia of vulva, unspecified: Secondary | ICD-10-CM

## 2023-05-22 NOTE — Progress Notes (Signed)
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 female who is seen in consultation from [facility], [place] for post op visit post EUA, mapping, biopsies, and ablation with Dr Sonia Side on 03/27/23 at [facility].   Final pathology showed lichen sclerosis. She is using steroid cream now per PA Copeland. No new complaints.   Pathology:  DIAGNOSIS: A.  ENDOCERVICAL POLYP; POLYPECTOMY: - BENIGN ENDOCERVICAL POLYP. - NEGATIVE FOR SQUAMOUS INTRAEPITHELIAL LESION AND MALIGNANCY.  B. VULVA, RIGHT LATERAL AT 3:00; BIOPSY: - LICHEN SCLEROSUS. - NEGATIVE FOR SQUAMOUS INTRAEPITHELIAL LESION AND MALIGNANCY.  C. VULVA, MEDIAL AT 11:00; BIOPSY: - BENIGN SQUAMOUS MUCOSA WITH CHANGES OF EARLY LICHEN SCLEROSUS. - NEGATIVE FOR SQUAMOUS INTRAEPITHELIAL LESION AND MALIGNANCY.  D.  VULVA, LATERAL LABIA MINORA AT 11:00; BIOPSY: - BENIGN SQUAMOUS MUCOSA WITH FOCAL CHANGES SUGGESTIVE OF EARLY LICHEN SCLEROSUS. - NEGATIVE FOR SQUAMOUS INTRAEPITHELIAL LESION AND MALIGNANCY.  E. VULVA, LEFT LATERAL AT 3:00; BIOPSY: - LICHEN SCLEROSUS. - NEGATIVE FOR SQUAMOUS INTRAEPITHELIAL LESION AND MALIGNANCY.  F. VULVA, RIGHT PERIANAL AT 9:00; BIOPSY: - LICHEN SCLEROSUS. - NEGATIVE FOR SQUAMOUS INTRAEPITHELIAL LESION AND MALIGNANCY.  G. VULVA, LEFT PERIANAL AT 3:00; BIOPSY: - MINUTE SUPERFICIAL FRAGMENT OF THINNED SQUAMOUS EPITHELIUM WITH HYPERKERATOSIS AND LOSS OF RETE PEGS, POTENTIALLY SUGGESTIVE OF SUPERFICIAL SAMPLING OF LICHEN SCLEROSUS. - NEGATIVE FOR SQUAMOUS INTRAEPITHELIAL LESION AND MALIGNANCY.  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  Biopsy sections are without koilocytic change, and predominantly show thinned epidermis with hyperkeratosis, hypergranulosis, loss of rete pegs, and underlying dermal fibrosis/homogenization, compatible with lichen  sclerosus.    Gynecologic Oncology:  She is postmenopausal., presents today for VIN. At her annual exam she c/o  vulvar irritation which is chronic. Previously, Dr. Arvil Chaco gave her mycolog cream that helped, but sx persisted. Rx changed to lotrisone crm 4/18 with sx improvement. Sx flare with sweating. Sx improve with lotrisone cream BID for 2 wks but then recur after a few days now. She has had vulvar irritation issues and has been in a scratch/itch cycle for years. Recently she tried topical steroids but that made her symptoms worse.   She saw Dr. Patsy Lager who did a thorough exam with biopsy. See figure below with findings.   Genitourinary Comments: EXT EXAM WITH ERYTHEMA IN PATCHES BILAT LABIA MAJORA WITH A LITTLE SKIN BREAKDOWN     Right Labia: tenderness and lesions.     Right Labia: No rash.    Left Labia: tenderness and lesions.     Left Labia: No rash.    BIOPSY:  - Koilocytic atypia, consistent with low-grade squamous intraepithelial lesion (VIN1, low-grade dysplasia)    Last Pap: 07/01/20 Results were: no abnormalities /neg HPV DNA.     Last mammogram: 08/18/19  Results were: normal--routine follow-up in 12 months There is no FH of breast cancer. There is no FH of ovarian cancer. The patient does not do self-breast exams.   Colonoscopy: colonoscopy 2017; Repeat after 5 yrs per pt report. She has appt 4/24. DEXA ordered with PCP   Problem List: Patient Active Problem List   Diagnosis Date Noted   PVD (peripheral vascular disease) (HCC) 11/06/2022   Prediabetes 11/09/2021   Iron deficiency 11/09/2021   Gastroesophageal reflux disease without esophagitis 11/09/2021   Mixed hyperlipidemia 11/09/2021   Class 3 severe obesity due to excess calories with body mass index (BMI) of 45.0  to 49.9 in adult [facility]) 11/09/2021   Pain in joint of left knee 08/17/2021   Osteoporosis 04/12/2018   Essential (primary) hypertension 06/13/2015   Uncomplicated asthma 06/13/2015   Barrett's  esophagus without dysplasia 06/13/2015   Osteoarthritis 06/13/2015    Past Medical History: Past Medical History:  Diagnosis Date   Allergy    Arthritis    Asthma    Barrett esophagus    Chicken pox    Chronic vaginitis    Dysplastic nevus 08/30/2016   upper back spinal, mild   GERD (gastroesophageal reflux disease)    History of colon polyps    Hyperlipidemia    Hypertension    Osteoporosis    Pre-diabetes    Psoriasis (a type of skin inflammation)    to the scalp   Vitamin D deficiency    Vulvar intraepithelial neoplasia (VIN) grade 1     Past Surgical History: Past Surgical History:  Procedure Laterality Date   CERVICAL POLYPECTOMY N/A 03/27/2023   Procedure: CERVICAL POLYPECTOMY/ BIOPSY;  Surgeon: Artelia Laroche, MD;  Location: [facility] ORS;  Service: Gynecology;  Laterality: N/A;   CHOLECYSTECTOMY  1983   COLONOSCOPY WITH PROPOFOL N/A 08/28/2016   Procedure: COLONOSCOPY WITH PROPOFOL;  Surgeon: Christena Deem, MD;  Location: [facility] ENDOSCOPY;  Service: Endoscopy;  Laterality: N/A;   COLPOSCOPY N/A 03/27/2023   Procedure: COLPOSCOPY;  Surgeon: Artelia Laroche, MD;  Location: [facility] ORS;  Service: Gynecology;  Laterality: N/A;   ESOPHAGOGASTRODUODENOSCOPY (EGD) WITH PROPOFOL N/A 08/28/2016   Procedure: ESOPHAGOGASTRODUODENOSCOPY (EGD) WITH PROPOFOL;  Surgeon: Christena Deem, MD;  Location: [facility] ENDOSCOPY;  Service: Endoscopy;  Laterality: N/A;   ESOPHAGOGASTRODUODENOSCOPY (EGD) WITH PROPOFOL N/A 09/15/2019   Procedure: ESOPHAGOGASTRODUODENOSCOPY (EGD) WITH PROPOFOL;  Surgeon: Christena Deem, MD;  Location: [facility] ENDOSCOPY;  Service: Endoscopy;  Laterality: N/A;   TONSILLECTOMY  1969   VULVA /PERINEUM BIOPSY Bilateral 03/27/2023   Procedure: VULVAR BIOPSY - MULTIPLE;  Surgeon: Artelia Laroche, MD;  Location: [facility] ORS;  Service: Gynecology;  Laterality: Bilateral;   VULVAR LESION REMOVAL  03/27/2023   Procedure: ABLATION VULVAR INTRAEPITHELIAL LESION;   Surgeon: Artelia Laroche, MD;  Location: [facility] ORS;  Service: Gynecology;;    Past Gynecologic History:  As per HPI  OB History:  OB History  Gravida Para Term Preterm AB Living  3 2 2   1 2   SAB IAB Ectopic Multiple Live Births  1       2    # Outcome Date GA Lbr Len/2nd Weight Sex Delivery Anes PTL Lv  3 Term 01/04/81 [redacted]w[redacted]d  8 lb 4.8 oz (3.765 kg) F Vag-Spont     2 Term 08/20/79 [redacted]w[redacted]d  6 lb 11.2 oz (3.039 kg) F Vag-Spont     1 SAB             Family History: Family History  Problem Relation Age of Onset   Arthritis Mother    Heart disease Mother    Stroke Mother    Hypertension Mother    Alcohol abuse Father    Alcohol abuse Sister    Mental illness Sister    Stomach cancer Sister 50   Alcohol abuse Brother    Drug abuse Brother    Arthritis Maternal Grandmother    Stroke Maternal Grandmother    Colon cancer Maternal Grandfather 30   Breast cancer Neg Hx     Social History: Social History   Socioeconomic History   Marital status: Married    Spouse  name: Not on file   Number of children: Not on file   Years of education: Not on file   Highest education level: Not on file  Occupational History   Not on file  Tobacco Use   Smoking status: Never   Smokeless tobacco: Never  Vaping Use   Vaping Use: Never used  Substance and Sexual Activity   Alcohol use: No    Alcohol/week: 0.0 standard drinks of alcohol   Drug use: Never   Sexual activity: Yes    Birth control/protection: Post-menopausal  Other Topics Concern   Not on file  Social History Narrative   Married and  Lives at home  with husband .   Social Determinants of Health   Financial Resource Strain: Not on file  Food Insecurity: Not on file  Transportation Needs: Not on file  Physical Activity: Not on file  Stress: Not on file  Social Connections: Not on file  Intimate Partner Violence: Not on file    Allergies: Allergies  Allergen Reactions   Bactrim  [Sulfamethoxazole-Trimethoprim] Nausea Only   Codeine Nausea And Vomiting   Hydrocodone Hives   Lipitor [Atorvastatin] Other (See Comments)    Myalgia    Medrol [Methylprednisolone] Nausea Only    Current Medications: Current Outpatient Medications  Medication Sig Dispense Refill   albuterol (VENTOLIN HFA) 108 (90 Base) MCG/ACT inhaler INHALE 1 TO 2 PUFFS BY MOUTH EVERY 6 HOURS AS NEEDED FOR WHEEZING OR SHORTNESS OF BREATH 8 g 5   Apple Cider Vinegar 600 MG CAPS Take by mouth.     Ascorbic Acid (VITAMIN C) 1000 MG tablet Take 1,000 mg by mouth daily.     Biotin 78295 MCG TABS Take by mouth.     clobetasol (TEMOVATE) 0.05 % external solution Apply 1 Application topically 2 (two) times daily. Apply to aa's psoriasis in the scalp QD up to 5d/wk. Avoid applying to face, groin, and axilla. Use as directed. Long-term use can cause thinning of the skin. 50 mL 1   clobetasol ointment (TEMOVATE) 0.05 % Apply to affected area every night for 4 weeks, then every other day for 4 weeks and then twice a week for 4 weeks or until resolution. 45 g 0   cyanocobalamin (VITAMIN B12) 1000 MCG tablet Take 1,000 mcg by mouth daily.     ferrous sulfate 325 (65 FE) MG EC tablet Take 325 mg by mouth as needed.     lisinopril-hydrochlorothiazide (ZESTORETIC) 20-25 MG tablet TAKE 1 TABLET BY MOUTH ONCE DAILY (OFFICE  VISIT  REQUIRED  FOR  FURTHER  REFILLS) 90 tablet 1   Moringa 500 MG CAPS Take by mouth.     omega-3 fish oil (MAXEPA) 1000 MG CAPS capsule Take by mouth.     pantoprazole (PROTONIX) 40 MG tablet Take 1 tablet (40 mg total) by mouth daily. 90 tablet 1   VITAMIN D, CHOLECALCIFEROL, PO Take 1 capsule by mouth daily at 6 (six) AM.     No current facility-administered medications for this visit.    Review of Systems General:  no complaints Skin: no complaints Eyes: no complaints HEENT: no complaints Breasts: no complaints Pulmonary: no complaints Cardiac: no complaints Gastrointestinal: no  complaints Genitourinary/Sexual: no complaints Ob/Gyn: no complaints Musculoskeletal: no complaints Hematology: no complaints Neurologic/Psych: no complaints   Objective:  Physical Examination:  BP (!) 142/67   Pulse 83   Temp (!) 96.7 F (35.9 C)   Resp 20   Wt 263 lb 6.4 oz (119.5 kg)  SpO2 100%   BMI 46.66 kg/m    ECOG Performance Status: 0 - Asymptomatic  GENERAL: Patient is a well appearing female in no acute distress HEENT:  PERRL, soft, no thyroid without masses.  NODES:  No cervical, supraclavicular, axillary, or inguinal lymphadenopathy palpated.  LUNGS:  Clear to auscultation bilaterally.  No wheezes or rhonchi. HEART:  Regular rate and rhythm.  ABDOMEN:  Soft, nontender, nondistended.  EXTREMITIES:  No peripheral edema.   NEURO:  Nonfocal. Well oriented.  Appropriate affect.  Pelvic chaperoned by nurse CMA Vulva: atrophic, white cw lichen sclerosis.  Biopsy and ablation sites all well healed.  Vagina: deferred Cervix: deferred.  Bimanual: deferred  RV: Deferred  Lab Review Labs on site today:  Radiologic Imaging: N/A    Assessment:  SHELMA MILLWOOD is a 67 y.o. 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 Underwent EUA, mapping biopsies, and ablation with Dr Sonia Side on 03/27/23 at [facility]. All biopsies showed lichen sclerosis.  Normal post op exam today.  Saw Levin Erp PA and started corticosteroid ointment (clobetasol).    Medical co-morbidities complicating care: HTN and obesity.  Plan:   Problem List Items Addressed This Visit   None Visit Diagnoses     Vulval intraepithelial neoplasia with lichen sclerosus    -  Primary       Patient will continue follow up with Levin Erp, PA for lichen sclerosis. We can see her back in the future as needed.   Leida Lauth, MD   CC:  Althea Grimmer, PA

## 2023-06-04 ENCOUNTER — Telehealth: Payer: Self-pay | Admitting: Obstetrics and Gynecology

## 2023-06-04 NOTE — Telephone Encounter (Signed)
Pls put her in same day spot. Thx.

## 2023-06-04 NOTE — Telephone Encounter (Signed)
Patient called this morning requesting a follow up appointment.  She stated that it was suppose to be in a month. She also said it was for a medication that needed to be reduced.  I only have same day appointments left for the month of June.  Please advise

## 2023-06-09 NOTE — Progress Notes (Unsigned)
Lorre Munroe, NP   No chief complaint on file.   HPI:      Ms. JAEONNA VANDERSTELT is a 67 y.o. [id number] whose LMP was No LMP recorded. Patient is postmenopausal., presents today for f/u from vaginal bx/ablation done with Dr. Sonia Side 03/27/23. Pt with hx of labial itching and bx showed VIN 1 01/28/23. Pt also with LS confirmed on bx with Dr. Sonia Side. Hasn't started clobetasol yet.  Pt states she started having LT labial discomfort Friday night and then noticed pus when wiping/blotting. Now LT side is swollen and more tender, with continued purulent d/c. No fevers. Taking showers to help with sx. Was given vaginal medicine to use post surg and has done it a couple times.    Patient Active Problem List   Diagnosis Date Noted  . PVD (peripheral vascular disease) (HCC) 11/06/2022  . Prediabetes 11/09/2021  . Iron deficiency 11/09/2021  . Gastroesophageal reflux disease without esophagitis 11/09/2021  . Mixed hyperlipidemia 11/09/2021  . Class 3 severe obesity due to excess calories with body mass index (BMI) of 45.0 to 49.9 in adult [facility]) 11/09/2021  . Pain in joint of left knee 08/17/2021  . Osteoporosis 04/12/2018  . Essential (primary) hypertension 06/13/2015  . Uncomplicated asthma 06/13/2015  . Barrett's esophagus without dysplasia 06/13/2015  . Osteoarthritis 06/13/2015    Past Surgical History:  Procedure Laterality Date  . CERVICAL POLYPECTOMY N/A 03/27/2023   Procedure: CERVICAL POLYPECTOMY/ BIOPSY;  Surgeon: Artelia Laroche, MD;  Location: [facility] ORS;  Service: Gynecology;  Laterality: N/A;  . CHOLECYSTECTOMY  1983  . COLONOSCOPY WITH PROPOFOL N/A 08/28/2016   Procedure: COLONOSCOPY WITH PROPOFOL;  Surgeon: Christena Deem, MD;  Location: [facility] ENDOSCOPY;  Service: Endoscopy;  Laterality: N/A;  . COLPOSCOPY N/A 03/27/2023   Procedure: COLPOSCOPY;  Surgeon: Artelia Laroche, MD;  Location: [facility] ORS;  Service: Gynecology;  Laterality: N/A;  .  ESOPHAGOGASTRODUODENOSCOPY (EGD) WITH PROPOFOL N/A 08/28/2016   Procedure: ESOPHAGOGASTRODUODENOSCOPY (EGD) WITH PROPOFOL;  Surgeon: Christena Deem, MD;  Location: [facility] ENDOSCOPY;  Service: Endoscopy;  Laterality: N/A;  . ESOPHAGOGASTRODUODENOSCOPY (EGD) WITH PROPOFOL N/A 09/15/2019   Procedure: ESOPHAGOGASTRODUODENOSCOPY (EGD) WITH PROPOFOL;  Surgeon: Christena Deem, MD;  Location: [facility] ENDOSCOPY;  Service: Endoscopy;  Laterality: N/A;  . TONSILLECTOMY  1969  . VULVA /PERINEUM BIOPSY Bilateral 03/27/2023   Procedure: VULVAR BIOPSY - MULTIPLE;  Surgeon: Artelia Laroche, MD;  Location: [facility] ORS;  Service: Gynecology;  Laterality: Bilateral;  . VULVAR LESION REMOVAL  03/27/2023   Procedure: ABLATION VULVAR INTRAEPITHELIAL LESION;  Surgeon: Artelia Laroche, MD;  Location: [facility] ORS;  Service: Gynecology;;    Family History  Problem Relation Age of Onset  . Arthritis Mother   . Heart disease Mother   . Stroke Mother   . Hypertension Mother   . Alcohol abuse Father   . Alcohol abuse Sister   . Mental illness Sister   . Stomach cancer Sister 68  . Alcohol abuse Brother   . Drug abuse Brother   . Arthritis Maternal Grandmother   . Stroke Maternal Grandmother   . Colon cancer Maternal Grandfather 33  . Breast cancer Neg Hx     Social History   Socioeconomic History  . Marital status: Married    Spouse name: Not on file  . Number of children: Not on file  . Years of education: Not on file  . Highest education level: Not on file  Occupational History  . Not on file  Tobacco Use  . Smoking status: Never  . Smokeless tobacco: Never  Vaping Use  . Vaping Use: Never used  Substance and Sexual Activity  . Alcohol use: No    Alcohol/week: 0.0 standard drinks of alcohol  . Drug use: Never  . Sexual activity: Yes    Birth control/protection: Post-menopausal  Other Topics Concern  . Not on file  Social History Narrative   Married and  Lives at home  with husband .    Social Determinants of Health   Financial Resource Strain: Not on file  Food Insecurity: Not on file  Transportation Needs: Not on file  Physical Activity: Not on file  Stress: Not on file  Social Connections: Not on file  Intimate Partner Violence: Not on file    Outpatient Medications Prior to Visit  Medication Sig Dispense Refill  . albuterol (VENTOLIN HFA) 108 (90 Base) MCG/ACT inhaler INHALE 1 TO 2 PUFFS BY MOUTH EVERY 6 HOURS AS NEEDED FOR WHEEZING OR SHORTNESS OF BREATH 8 g 5  . Apple Cider Vinegar 600 MG CAPS Take by mouth.    . Ascorbic Acid (VITAMIN C) 1000 MG tablet Take 1,000 mg by mouth daily.    . Biotin 16109 MCG TABS Take by mouth.    . clobetasol (TEMOVATE) 0.05 % external solution Apply 1 Application topically 2 (two) times daily. Apply to aa's psoriasis in the scalp QD up to 5d/wk. Avoid applying to face, groin, and axilla. Use as directed. Long-term use can cause thinning of the skin. 50 mL 1  . clobetasol ointment (TEMOVATE) 0.05 % Apply to affected area every night for 4 weeks, then every other day for 4 weeks and then twice a week for 4 weeks or until resolution. 45 g 0  . cyanocobalamin (VITAMIN B12) 1000 MCG tablet Take 1,000 mcg by mouth daily.    . ferrous sulfate 325 (65 FE) MG EC tablet Take 325 mg by mouth as needed.    Marland Kitchen lisinopril-hydrochlorothiazide (ZESTORETIC) 20-25 MG tablet TAKE 1 TABLET BY MOUTH ONCE DAILY (OFFICE  VISIT  REQUIRED  FOR  FURTHER  REFILLS) 90 tablet 1  . Moringa 500 MG CAPS Take by mouth.    . omega-3 fish oil (MAXEPA) 1000 MG CAPS capsule Take by mouth.    . pantoprazole (PROTONIX) 40 MG tablet Take 1 tablet (40 mg total) by mouth daily. 90 tablet 1  . VITAMIN D, CHOLECALCIFEROL, PO Take 1 capsule by mouth daily at 6 (six) AM.     No facility-administered medications prior to visit.      ROS:  Review of Systems  Constitutional:  Negative for fever.  Gastrointestinal:  Negative for blood in stool, constipation, diarrhea,  nausea and vomiting.  Genitourinary:  Positive for genital sores. Negative for dyspareunia, dysuria, flank pain, frequency, hematuria, urgency, vaginal bleeding, vaginal discharge and vaginal pain.  Musculoskeletal:  Negative for back pain.  Skin:  Negative for rash.   BREAST: No symptoms   OBJECTIVE:   Vitals:  There were no vitals taken for this visit.  Physical Exam Constitutional:      Appearance: Normal appearance.  Pulmonary:     Effort: Pulmonary effort is normal.  Genitourinary:    Labia:        Right: No rash, tenderness or lesion.        Left: Tenderness and lesion present.     Musculoskeletal:        General: Normal range of motion.  Neurological:     Mental  Status: She is alert and oriented to person, place, and time.  Psychiatric:        Judgment: Judgment normal.    Assessment/Plan: Lichen sclerosus - Plan: clobetasol ointment (TEMOVATE) 0.05 %; Rx clobetasol oint to start after vaginal sites all healed. Then do QHS for 4 wks and RTO for f/u at that time.   Post-op pain - Plan: lidocaine (XYLOCAINE) 2 % solution;  Discussed case with Consuello Masse, NP at [facility]. She came over and eval pt. Did not see any signs of infection; most likely granulation tissue and healing. She recommended sitz baths, lidocaine gel (Rx eRxd), cool compresses. Reassurance. She will see pt in office 4/ 12/24. May need future tx for granulation tissue.    No orders of the defined types were placed in this encounter.     No follow-ups on file.  Delorus Langwell B. Willard Madrigal, PA-C 06/09/2023 6:02 PM

## 2023-06-10 ENCOUNTER — Ambulatory Visit (INDEPENDENT_AMBULATORY_CARE_PROVIDER_SITE_OTHER): Payer: PPO | Admitting: Obstetrics and Gynecology

## 2023-06-10 ENCOUNTER — Encounter: Payer: Self-pay | Admitting: Obstetrics and Gynecology

## 2023-06-10 VITALS — BP 110/73 | HR 65 | Ht 63.0 in | Wt 263.0 lb

## 2023-06-10 DIAGNOSIS — L900 Lichen sclerosus et atrophicus: Secondary | ICD-10-CM

## 2023-06-10 NOTE — Patient Instructions (Signed)
I value your feedback and you entrusting us with your care. If you get a [facility] patient survey, I would appreciate you taking the time to let us know about your experience today. Thank you! ? ? ?

## 2023-06-20 ENCOUNTER — Ambulatory Visit (INDEPENDENT_AMBULATORY_CARE_PROVIDER_SITE_OTHER): Payer: PPO | Admitting: Internal Medicine

## 2023-06-20 ENCOUNTER — Encounter: Payer: Self-pay | Admitting: Internal Medicine

## 2023-06-20 VITALS — BP 118/74 | HR 82 | Temp 96.6°F | Wt 261.0 lb

## 2023-06-20 DIAGNOSIS — B9789 Other viral agents as the cause of diseases classified elsewhere: Secondary | ICD-10-CM

## 2023-06-20 DIAGNOSIS — J329 Chronic sinusitis, unspecified: Secondary | ICD-10-CM | POA: Diagnosis not present

## 2023-06-20 MED ORDER — TRIAMCINOLONE ACETONIDE 55 MCG/ACT NA AERO: 2.0000 | INHALATION_SPRAY | Freq: Every day | NASAL | 0 refills | Status: AC

## 2023-06-20 NOTE — Patient Instructions (Signed)
Sinus Pain  Sinus pain happens when your sinuses get swollen or blocked (clogged). Sinuses are spaces behind the bones of your face and forehead. You may feel pain or pressure in your face, forehead, ears, or upper teeth. Sinus pain can be mild or very bad. What are the causes? Sinus pain can result from conditions that affect your sinuses. Common causes include: Colds. Sinus infections. Allergies. What are the signs or symptoms? The main symptom of this condition is pain or pressure in your face, forehead, ears, or upper teeth. People who have sinus pain often have other symptoms, such as: Stuffed up or runny nose. Fever. Not being able to smell. Headache. Weather changes can make your symptoms worse. How is this treated? Treatment for this condition depends on the cause. Sinus pain caused by: A sinus infection may be treated with antibiotic medicine. A stuffy nose may be helped by rinsing out the nose and sinuses with a salt water solution. Allergies may be helped by allergy medicines and nasal sprays. Surgery may be needed in some cases if other treatments do not help. Follow these instructions at home: General instructions If told: Apply a warm, moist washcloth to your face. This can help to lessen pain. Use a nasal salt water wash. Follow the directions on the bottle or box. Hydrate and humidify Drink enough water to keep your pee (urine) pale yellow. Use a humidifier if your home is dry. Breathe in steam for 10-15 minutes, 3-4 times a day or as told by your doctor. You can do this in the bathroom while a hot shower is running. Try not to spend time in cool or dry air. Medicines  Take over-the-counter and prescription medicines only as told by your doctor. If you were prescribed an antibiotic medicine, take it as told by your doctor. Do not stop taking it even if you start to feel better. Use a nose spray if your nose feels full of mucus (congested). Contact a doctor if: You  get more than one headache a week. Light or sound bothers you. You have a fever. You feel sick to your stomach (nauseous) or you vomit. Your headaches do not get better with treatment. Get help right away if: You have trouble seeing. You suddenly have very bad pain in your face or head. You start to have quick, sudden movements or shaking that you cannot control (seizure). You are confused. You have a stiff neck. Summary Sinus pain happens when your sinuses get swollen or blocked (clogged). Sinuses are spaces behind the bones of your face and forehead. You may feel pain or pressure in your face, forehead, ears, or upper teeth. Take over-the-counter and prescription medicines only as told by your doctor. If told, apply a warm, moist washcloth to your face. This can help to lessen pain. This information is not intended to replace advice given to you by your health care provider. Make sure you discuss any questions you have with your health care provider. Document Revised: 11/19/2021 Document Reviewed: 11/19/2021 Elsevier Patient Education  2024 [employer]  

## 2023-06-20 NOTE — Progress Notes (Signed)
Subjective:    Patient ID: Patricia Garner, female    DOB: [birth year], 67 y.o.   MRN: [id number]  HPI  Patient presents to clinic today with complaint of sinus pain, nasal congestion,  ear fullness and sore throat. This started 3 days ago. She is not blowing any thing out of her nose.  She denies runny nose, cough, shortness of breath, nausea, vomiting or diarrhea.  She denies fever, chills or body aches.  She has tried ibuprofen, tylenol sinus with minimal relief of symptoms. She does have allergies and was washing the drapes filled with dust.  She has a history of asthma managed on albuterol as needed.  Review of Systems     Past Medical History:  Diagnosis Date   Allergy    Arthritis    Asthma    Barrett esophagus    Chicken pox    Chronic vaginitis    Dysplastic nevus 08/30/2016   upper back spinal, mild   GERD (gastroesophageal reflux disease)    History of colon polyps    Hyperlipidemia    Hypertension    Osteoporosis    Pre-diabetes    Psoriasis (a type of skin inflammation)    to the scalp   Vitamin D deficiency    Vulvar intraepithelial neoplasia (VIN) grade 1     Current Outpatient Medications  Medication Sig Dispense Refill   albuterol (VENTOLIN HFA) 108 (90 Base) MCG/ACT inhaler INHALE 1 TO 2 PUFFS BY MOUTH EVERY 6 HOURS AS NEEDED FOR WHEEZING OR SHORTNESS OF BREATH 8 g 5   Apple Cider Vinegar 600 MG CAPS Take by mouth.     Ascorbic Acid (VITAMIN C) 1000 MG tablet Take 1,000 mg by mouth daily.     Biotin 40981 MCG TABS Take by mouth.     clobetasol (TEMOVATE) 0.05 % external solution Apply 1 Application topically 2 (two) times daily. Apply to aa's psoriasis in the scalp QD up to 5d/wk. Avoid applying to face, groin, and axilla. Use as directed. Long-term use can cause thinning of the skin. 50 mL 1   clobetasol ointment (TEMOVATE) 0.05 % Apply to affected area every night for 4 weeks, then every other day for 4 weeks and then twice a week for 4 weeks or until  resolution. 45 g 0   cyanocobalamin (VITAMIN B12) 1000 MCG tablet Take 1,000 mcg by mouth daily.     ferrous sulfate 325 (65 FE) MG EC tablet Take 325 mg by mouth as needed.     lisinopril-hydrochlorothiazide (ZESTORETIC) 20-25 MG tablet TAKE 1 TABLET BY MOUTH ONCE DAILY (OFFICE  VISIT  REQUIRED  FOR  FURTHER  REFILLS) 90 tablet 1   Moringa 500 MG CAPS Take by mouth.     omega-3 fish oil (MAXEPA) 1000 MG CAPS capsule Take by mouth.     pantoprazole (PROTONIX) 40 MG tablet Take 1 tablet (40 mg total) by mouth daily. 90 tablet 1   VITAMIN D, CHOLECALCIFEROL, PO Take 1 capsule by mouth daily at 6 (six) AM.     No current facility-administered medications for this visit.    Allergies  Allergen Reactions   Bactrim [Sulfamethoxazole-Trimethoprim] Nausea Only   Codeine Nausea And Vomiting   Hydrocodone Hives   Lipitor [Atorvastatin] Other (See Comments)    Myalgia    Medrol [Methylprednisolone] Nausea Only    Family History  Problem Relation Age of Onset   Arthritis Mother    Heart disease Mother    Stroke Mother  Hypertension Mother    Alcohol abuse Father    Alcohol abuse Sister    Mental illness Sister    Stomach cancer Sister 69   Alcohol abuse Brother    Drug abuse Brother    Arthritis Maternal Grandmother    Stroke Maternal Grandmother    Colon cancer Maternal Grandfather 70   Breast cancer Neg Hx     Social History   Socioeconomic History   Marital status: Married    Spouse name: Not on file   Number of children: Not on file   Years of education: Not on file   Highest education level: Not on file  Occupational History   Not on file  Tobacco Use   Smoking status: Never   Smokeless tobacco: Never  Vaping Use   Vaping Use: Never used  Substance and Sexual Activity   Alcohol use: No    Alcohol/week: 0.0 standard drinks of alcohol   Drug use: Never   Sexual activity: Yes    Birth control/protection: Post-menopausal  Other Topics Concern   Not on file   Social History Narrative   Married and  Lives at home  with husband .   Social Determinants of Health   Financial Resource Strain: Not on file  Food Insecurity: Not on file  Transportation Needs: Not on file  Physical Activity: Not on file  Stress: Not on file  Social Connections: Not on file  Intimate Partner Violence: Not on file     Constitutional: Denies fever, malaise, fatigue, headache or abrupt weight changes.  HEENT: Patient reports sinus pressure, nasal congestion, ear fullness and sore throat.  Denies eye pain, eye redness, ear pain, ringing in the ears, wax buildup, runny nose,  bloody nose. Respiratory: Denies difficulty breathing, shortness of breath, cough or sputum production.   Cardiovascular: Denies chest pain, chest tightness, palpitations or swelling in the hands or feet.  Neurological: Denies dizziness, difficulty with memory, difficulty with speech or problems with balance and coordination.    No other specific complaints in a complete review of systems (except as listed in HPI above).  Objective:   Physical Exam  BP 118/74 (BP Location: Left Arm, Patient Position: Sitting, Cuff Size: Large)   Pulse 82   Temp (!) 96.6 F (35.9 C) (Temporal)   Wt 261 lb (118.4 kg)   SpO2 97%   BMI 46.23 kg/m   Wt Readings from Last 3 Encounters:  06/10/23 263 lb (119.3 kg)  05/22/23 263 lb 6.4 oz (119.5 kg)  05/07/23 260 lb (117.9 kg)    General: Appears her stated age, obese in NAD. Skin: Warm, dry and intact.  HEENT: Head: normal shape and size, right maxillary sinus tenderness noted; Eyes: sclera white, no icterus, conjunctiva pink, PERRLA and EOMs intact; Ears: Unable to visualize TMs, hard wax noted next to the TM bilaterally; Nose: mucosa pink and moist, septum midline; Throat/Mouth: Teeth present, mucosa pink and moist, + PND, no exudate, lesions or ulcerations noted.  Neck: No adenopathy noted. Cardiovascular: Normal rate and rhythm. S1,S2 noted.  No murmur,  rubs or gallops noted.  Pulmonary/Chest: Normal effort and positive vesicular breath sounds. No respiratory distress. No wheezes, rales or ronchi noted.  Neurological: Alert and oriented. Coordination normal.    BMET    Component Value Date/Time   NA 138 05/07/2023 0913   K 4.2 05/07/2023 0913   CL 101 05/07/2023 0913   CO2 28 05/07/2023 0913   GLUCOSE 109 05/07/2023 0913   BUN 22 05/07/2023  0913   CREATININE 0.69 05/07/2023 0913   CALCIUM 10.0 05/07/2023 0913    Lipid Panel     Component Value Date/Time   CHOL 248 (H) 05/07/2023 0913   TRIG 213 (H) 05/07/2023 0913   HDL 44 (L) 05/07/2023 0913   CHOLHDL 5.6 (H) 05/07/2023 0913   VLDL 30.6 09/01/2020 1440   LDLCALC 166 (H) 05/07/2023 0913    CBC    Component Value Date/Time   WBC 7.9 05/07/2023 0913   RBC 3.93 05/07/2023 0913   HGB 11.7 05/07/2023 0913   HCT 36.3 05/07/2023 0913   PLT 284 05/07/2023 0913   MCV 92.4 05/07/2023 0913   MCH 29.8 05/07/2023 0913   MCHC 32.2 05/07/2023 0913   RDW 12.2 05/07/2023 0913    Hgb A1C Lab Results  Component Value Date   HGBA1C 6.2 (H) 05/07/2023           Assessment & Plan:   Viral Sinusitis:  Can use a neti pot which can be purchased from your local pharmacy No indication for antibiotics at this time Rx for Nasacort 1 spray twice daily in right nostril Encourage rest and fluid   RTC in 5 months for follow-up of chronic conditions Nicki Reaper, NP

## 2023-07-22 ENCOUNTER — Encounter: Payer: Self-pay | Admitting: *Deleted

## 2023-09-09 ENCOUNTER — Encounter: Payer: Self-pay | Admitting: *Deleted

## 2023-09-16 ENCOUNTER — Other Ambulatory Visit: Payer: Self-pay | Admitting: Gastroenterology

## 2023-09-16 ENCOUNTER — Ambulatory Visit: Payer: PPO | Admitting: Anesthesiology

## 2023-09-16 ENCOUNTER — Encounter
Admission: RE | Disposition: A | Discharge status: Home / Self Care | Payer: Self-pay | Source: Home / Self Care | Attending: Gastroenterology

## 2023-09-16 ENCOUNTER — Ambulatory Visit
Admission: RE | Admit: 2023-09-16 | Discharge: 2023-09-16 | Disposition: A | Discharge status: Home / Self Care | Payer: PPO | Attending: Gastroenterology | Admitting: Gastroenterology

## 2023-09-16 DIAGNOSIS — K640 First degree hemorrhoids: Secondary | ICD-10-CM | POA: Diagnosis not present

## 2023-09-16 DIAGNOSIS — K2270 Barrett's esophagus without dysplasia: Secondary | ICD-10-CM | POA: Diagnosis not present

## 2023-09-16 DIAGNOSIS — I10 Essential (primary) hypertension: Secondary | ICD-10-CM | POA: Diagnosis not present

## 2023-09-16 DIAGNOSIS — K635 Polyp of colon: Secondary | ICD-10-CM | POA: Insufficient documentation

## 2023-09-16 DIAGNOSIS — I739 Peripheral vascular disease, unspecified: Secondary | ICD-10-CM | POA: Insufficient documentation

## 2023-09-16 DIAGNOSIS — K649 Unspecified hemorrhoids: Secondary | ICD-10-CM | POA: Diagnosis not present

## 2023-09-16 DIAGNOSIS — Z6841 Body Mass Index (BMI) 40.0 and over, adult: Secondary | ICD-10-CM | POA: Diagnosis not present

## 2023-09-16 DIAGNOSIS — Z8719 Personal history of other diseases of the digestive system: Secondary | ICD-10-CM | POA: Diagnosis not present

## 2023-09-16 DIAGNOSIS — K5730 Diverticulosis of large intestine without perforation or abscess without bleeding: Secondary | ICD-10-CM | POA: Diagnosis not present

## 2023-09-16 DIAGNOSIS — E559 Vitamin D deficiency, unspecified: Secondary | ICD-10-CM | POA: Insufficient documentation

## 2023-09-16 DIAGNOSIS — K219 Gastro-esophageal reflux disease without esophagitis: Secondary | ICD-10-CM | POA: Diagnosis not present

## 2023-09-16 DIAGNOSIS — Z9049 Acquired absence of other specified parts of digestive tract: Secondary | ICD-10-CM | POA: Insufficient documentation

## 2023-09-16 DIAGNOSIS — Z1211 Encounter for screening for malignant neoplasm of colon: Secondary | ICD-10-CM | POA: Diagnosis not present

## 2023-09-16 DIAGNOSIS — Z79899 Other long term (current) drug therapy: Secondary | ICD-10-CM | POA: Diagnosis not present

## 2023-09-16 HISTORY — PX: POLYPECTOMY: SHX5525

## 2023-09-16 HISTORY — PX: HEMOSTASIS CLIP PLACEMENT: SHX6857

## 2023-09-16 HISTORY — PX: ESOPHAGOGASTRODUODENOSCOPY (EGD) WITH PROPOFOL: SHX5813

## 2023-09-16 HISTORY — PX: COLONOSCOPY WITH PROPOFOL: SHX5780

## 2023-09-16 SURGERY — COLONOSCOPY WITH PROPOFOL
Anesthesia: General

## 2023-09-16 MED ORDER — LIDOCAINE HCL (PF) 2 % IJ SOLN
INTRAMUSCULAR | Status: AC
  Filled 2023-09-16: qty 5

## 2023-09-16 MED ORDER — PROPOFOL 1000 MG/100ML IV EMUL
INTRAVENOUS | Status: AC
  Filled 2023-09-16: qty 200

## 2023-09-16 MED ORDER — SODIUM CHLORIDE 0.9 % IV SOLN
INTRAVENOUS | Status: DC
  Administered 2023-09-16: 20 mL/h via INTRAVENOUS

## 2023-09-16 MED ORDER — LIDOCAINE HCL (CARDIAC) PF 100 MG/5ML IV SOSY
PREFILLED_SYRINGE | INTRAVENOUS | Status: DC | PRN
  Administered 2023-09-16: 100 mg via INTRAVENOUS

## 2023-09-16 MED ORDER — PROPOFOL 10 MG/ML IV BOLUS
INTRAVENOUS | Status: AC
  Filled 2023-09-16: qty 40

## 2023-09-16 MED ORDER — PROPOFOL 10 MG/ML IV BOLUS
INTRAVENOUS | Status: DC | PRN
  Administered 2023-09-16 (×3): 40 mg via INTRAVENOUS
  Administered 2023-09-16: 100 mg via INTRAVENOUS
  Administered 2023-09-16 (×2): 40 mg via INTRAVENOUS
  Administered 2023-09-16: 50 mg via INTRAVENOUS
  Administered 2023-09-16: 40 mg via INTRAVENOUS

## 2023-09-16 NOTE — Op Note (Signed)
[facility] Patient Name: Patricia Garner Procedure Date: 09/16/2023 7:30 AM MRN: [id number] Account #: [id number] Date of Birth: July 09, 1956 Admit Type: Outpatient Age: 67 Room: [facility] ENDO ROOM 3 Gender: Female Note Status: Finalized Instrument Name: Prentice Docker [id number] Procedure:             Colonoscopy Indications:           High risk colon cancer surveillance: Personal history                         of colonic polyps, Last colonoscopy 5 years ago Providers:             Eather Colas MD, MD Referring MD:          Eather Colas MD, MD (Referring MD) Medicines:             Monitored Anesthesia Care Complications:         No immediate complications. Estimated blood loss:                         Minimal. Procedure:             Pre-Anesthesia Assessment:                        - Prior to the procedure, a History and Physical was                         performed, and patient medications and allergies were                         reviewed. The patient is competent. The risks and                         benefits of the procedure and the sedation options and                         risks were discussed with the patient. All questions                         were answered and informed consent was obtained.                         Patient identification and proposed procedure were                         verified by the physician, the nurse, the                         anesthesiologist, the anesthetist and the technician                         in the endoscopy suite. Mental Status Examination:                         alert and oriented. Airway Examination: normal                         oropharyngeal airway and neck mobility. Respiratory  Examination: clear to auscultation. CV Examination:                         normal. Prophylactic Antibiotics: The patient does not                         require prophylactic antibiotics. Prior                          Anticoagulants: The patient has taken no anticoagulant                         or antiplatelet agents. ASA Grade Assessment: III - A                         patient with severe systemic disease. After reviewing                         the risks and benefits, the patient was deemed in                         satisfactory condition to undergo the procedure. The                         anesthesia plan was to use monitored anesthesia care                         (MAC). Immediately prior to administration of                         medications, the patient was re-assessed for adequacy                         to receive sedatives. The heart rate, respiratory                         rate, oxygen saturations, blood pressure, adequacy of                         pulmonary ventilation, and response to care were                         monitored throughout the procedure. The physical                         status of the patient was re-assessed after the                         procedure.                        After obtaining informed consent, the colonoscope was                         passed under direct vision. Throughout the procedure,                         the patient's blood pressure, pulse, and oxygen  saturations were monitored continuously. The                         Colonoscope was introduced through the anus and                         advanced to the the cecum, identified by appendiceal                         orifice and ileocecal valve. The colonoscopy was                         performed without difficulty. The patient tolerated                         the procedure well. The quality of the bowel                         preparation was good. The ileocecal valve, appendiceal                         orifice, and rectum were photographed. Findings:      The perianal and digital rectal examinations were normal.      A 1 mm polyp was found  in the cecum. The polyp was sessile. The polyp       was removed with a jumbo cold forceps. Resection and retrieval were       complete. Estimated blood loss was minimal.      A 3 mm polyp was found in the descending colon. The polyp was sessile.       The polyp was removed with a cold snare. Resection and retrieval were       complete. Estimated blood loss was minimal.      Multiple small-mouthed diverticula were found in the sigmoid colon.      Internal hemorrhoids were found during retroflexion. The hemorrhoids       were Grade I (internal hemorrhoids that do not prolapse).      The exam was otherwise without abnormality on direct and retroflexion       views. Impression:            - One 1 mm polyp in the cecum, removed with a jumbo                         cold forceps. Resected and retrieved.                        - One 3 mm polyp in the descending colon, removed with                         a cold snare. Resected and retrieved.                        - Diverticulosis in the sigmoid colon.                        - Internal hemorrhoids.                        - The examination was otherwise normal on  direct and                         retroflexion views. Recommendation:        - Discharge patient to home.                        - Resume previous diet.                        - Continue present medications.                        - Await pathology results.                        - Repeat colonoscopy in 7-10 years for surveillance.                        - Return to referring physician as previously                         scheduled. Procedure Code(s):     --- Professional ---                        [phone], Colonoscopy, flexible; with removal of                         tumor(s), polyp(s), or other lesion(s) by snare                         technique                        45380, 59, Colonoscopy, flexible; with biopsy, single                         or multiple Diagnosis Code(s):      --- Professional ---                        Z86.010, Personal history of colonic polyps                        D12.0, Benign neoplasm of cecum                        D12.4, Benign neoplasm of descending colon                        K64.0, First degree hemorrhoids                        K57.30, Diverticulosis of large intestine without                         perforation or abscess without bleeding CPT copyright 2022 American Medical Association. All rights reserved. The codes documented in this report are preliminary and upon coder review may  be revised to meet current compliance requirements. Eather Colas MD, MD 09/16/2023 8:29:24 AM Number of Addenda: 0 Note Initiated On: 09/16/2023 7:30 AM Scope Withdrawal Time: 0 hours 9 minutes 56 seconds  Total Procedure Duration: 0  hours 15 minutes 14 seconds  Estimated Blood Loss:  Estimated blood loss was minimal.      [facility]

## 2023-09-16 NOTE — Anesthesia Postprocedure Evaluation (Signed)
Anesthesia Post Note  Patient: Patricia Garner  Procedure(s) Performed: COLONOSCOPY WITH PROPOFOL ESOPHAGOGASTRODUODENOSCOPY (EGD) WITH PROPOFOL POLYPECTOMY  Patient location during evaluation: Endoscopy Anesthesia Type: General Level of consciousness: awake and alert Pain management: pain level controlled Vital Signs Assessment: post-procedure vital signs reviewed and stable Respiratory status: spontaneous breathing, nonlabored ventilation, respiratory function stable and patient connected to nasal cannula oxygen Cardiovascular status: blood pressure returned to baseline and stable Postop Assessment: no apparent nausea or vomiting Anesthetic complications: no   No notable events documented.   Last Vitals:  Vitals:   09/16/23 0820 09/16/23 0830  BP: (!) 110/57 130/74  Pulse: 75   Resp: (!) 24   Temp: (!) 36.1 C   SpO2: 100%     Last Pain:  Vitals:   09/16/23 0840  TempSrc:   PainSc: 0-No pain                 Corinda Gubler

## 2023-09-16 NOTE — Transfer of Care (Signed)
Immediate Anesthesia Transfer of Care Note  Patient: Patricia Garner  Procedure(s) Performed: COLONOSCOPY WITH PROPOFOL ESOPHAGOGASTRODUODENOSCOPY (EGD) WITH PROPOFOL POLYPECTOMY  Patient Location: Endoscopy Unit  Anesthesia Type:General  Level of Consciousness: awake, alert , and oriented  Airway & Oxygen Therapy: Patient Spontanous Breathing  Post-op Assessment: Report given to RN, Post -op Vital signs reviewed and stable, and Patient moving all extremities  Post vital signs: Reviewed and stable  Last Vitals:  Vitals Value Taken Time  BP 110/57 09/16/23 0820  Temp 36.1 C 09/16/23 0820  Pulse 74 09/16/23 0821  Resp 27 09/16/23 0821  SpO2 100 % 09/16/23 0821  Vitals shown include unfiled device data.  Last Pain:  Vitals:   09/16/23 0820  TempSrc: Temporal  PainSc: Asleep         Complications: No notable events documented.

## 2023-09-16 NOTE — Op Note (Signed)
[facility] Patient Name: Patricia Garner Procedure Date: 09/16/2023 7:30 AM MRN: [id number] Account #: [id number] Date of Birth: 1956-03-08 Admit Type: Outpatient Age: 67 Room: [facility] ENDO ROOM 3 Gender: Female Note Status: Supervisor Override Instrument Name: Patton Salles Endoscope [id number] Procedure:             Upper GI endoscopy Indications:           Gastro-esophageal reflux disease, For endoscopic                         therapy of Barrett's esophagus without dysplasia Providers:             Eather Colas MD, MD Referring MD:          Eather Colas MD, MD (Referring MD) Medicines:             Monitored Anesthesia Care Complications:         No immediate complications. Procedure:             Pre-Anesthesia Assessment:                        - Prior to the procedure, a History and Physical was                         performed, and patient medications and allergies were                         reviewed. The patient is competent. The risks and                         benefits of the procedure and the sedation options and                         risks were discussed with the patient. All questions                         were answered and informed consent was obtained.                         Patient identification and proposed procedure were                         verified by the physician, the nurse, the                         anesthesiologist, the anesthetist and the technician                         in the endoscopy suite. Mental Status Examination:                         alert and oriented. Airway Examination: normal                         oropharyngeal airway and neck mobility. Respiratory                         Examination: clear to auscultation. CV Examination:  normal. Prophylactic Antibiotics: The patient does not                         require prophylactic antibiotics. Prior                          Anticoagulants: The patient has taken no anticoagulant                         or antiplatelet agents. ASA Grade Assessment: III - A                         patient with severe systemic disease. After reviewing                         the risks and benefits, the patient was deemed in                         satisfactory condition to undergo the procedure. The                         anesthesia plan was to use monitored anesthesia care                         (MAC). Immediately prior to administration of                         medications, the patient was re-assessed for adequacy                         to receive sedatives. The heart rate, respiratory                         rate, oxygen saturations, blood pressure, adequacy of                         pulmonary ventilation, and response to care were                         monitored throughout the procedure. The physical                         status of the patient was re-assessed after the                         procedure.                        After obtaining informed consent, the endoscope was                         passed under direct vision. Throughout the procedure,                         the patient's blood pressure, pulse, and oxygen                         saturations were monitored continuously. The Endoscope  was introduced through the mouth, and advanced to the                         second part of duodenum. The upper GI endoscopy was                         accomplished without difficulty. The patient tolerated                         the procedure well. Findings:      The examined esophagus was normal. No evidence of BE.      The entire examined stomach was normal.      The examined duodenum was normal. Impression:            - Normal esophagus.                        - Normal stomach.                        - Normal examined duodenum.                        - No specimens  collected. Recommendation:        - Discharge patient to home.                        - Resume previous diet.                        - Continue present medications.                        - Return to referring physician as previously                         scheduled. Procedure Code(s):     --- Professional ---                        [id number], Esophagogastroduodenoscopy, flexible,                         transoral; diagnostic, including collection of                         specimen(s) by brushing or washing, when performed                         (separate procedure) Diagnosis Code(s):     --- Professional ---                        K21.9, Gastro-esophageal reflux disease without                         esophagitis CPT copyright 2022 American Medical Association. All rights reserved. The codes documented in this report are preliminary and upon coder review may  be revised to meet current compliance requirements. Eather Colas MD, MD 09/16/2023 8:25:17 AM Number of Addenda: 0 Note Initiated On: 09/16/2023 7:30 AM Estimated Blood Loss:  Estimated blood loss: none.      [facility]

## 2023-09-16 NOTE — Anesthesia Preprocedure Evaluation (Signed)
Anesthesia Evaluation  Patient identified by MRN, date of birth, ID band Patient awake    Reviewed: Allergy & Precautions, H&P , NPO status , Patient's Chart, lab work & pertinent test results, reviewed documented beta blocker date and time   History of Anesthesia Complications Negative for: history of anesthetic complications  Airway Mallampati: III  TM Distance: >3 FB Neck ROM: full    Dental  (+) Chipped   Pulmonary asthma , neg sleep apnea, neg COPD, Patient abstained from smoking.Not current smoker Asthma listed, patient denies, even though she does take albuterol   Pulmonary exam normal breath sounds clear to auscultation       Cardiovascular Exercise Tolerance: Good METShypertension, Pt. on medications + Peripheral Vascular Disease  (-) CAD and (-) Past MI Normal cardiovascular exam(-) dysrhythmias  Rhythm:regular Rate:Normal - Systolic murmurs    Neuro/Psych negative neurological ROS  negative psych ROS   GI/Hepatic Neg liver ROS,GERD  Controlled and Medicated,,  Endo/Other  neg diabetes  Morbid obesity  Renal/GU negative Renal ROS  negative genitourinary   Musculoskeletal   Abdominal  (+) + obese  Peds  Hematology negative hematology ROS (+)   Anesthesia Other Findings Past Medical History: No date: Allergy No date: Arthritis No date: Asthma No date: Barrett esophagus No date: Chicken pox No date: Chronic vaginitis No date: GERD (gastroesophageal reflux disease) No date: History of colon polyps No date: Hyperlipidemia No date: Hypertension No date: Osteoporosis No date: Vitamin D deficiency Past Surgical History: 1983: CHOLECYSTECTOMY 08/28/2016: COLONOSCOPY WITH PROPOFOL; N/A     Comment:  Procedure: COLONOSCOPY WITH PROPOFOL;  Surgeon: Christena Deem, MD;  Location: [facility] ENDOSCOPY;  Service:               Endoscopy;  Laterality: N/A; 08/28/2016: ESOPHAGOGASTRODUODENOSCOPY (EGD)  WITH PROPOFOL; N/A     Comment:  Procedure: ESOPHAGOGASTRODUODENOSCOPY (EGD) WITH               PROPOFOL;  Surgeon: Christena Deem, MD;  Location:               [facility] ENDOSCOPY;  Service: Endoscopy;  Laterality: N/A; 09/15/2019: ESOPHAGOGASTRODUODENOSCOPY (EGD) WITH PROPOFOL; N/A     Comment:  Procedure: ESOPHAGOGASTRODUODENOSCOPY (EGD) WITH               PROPOFOL;  Surgeon: Christena Deem, MD;  Location:               [facility] ENDOSCOPY;  Service: Endoscopy;  Laterality: N/A; 1969: TONSILLECTOMY BMI    Body Mass Index: 49.38 kg/m     Reproductive/Obstetrics negative OB ROS                             Anesthesia Physical Anesthesia Plan  ASA: 3  Anesthesia Plan: General   Post-op Pain Management: Minimal or no pain anticipated   Induction: Intravenous  PONV Risk Score and Plan: 3 and Propofol infusion, TIVA and Ondansetron  Airway Management Planned: Nasal Cannula  Additional Equipment: None  Intra-op Plan:   Post-operative Plan:   Informed Consent: I have reviewed the patients History and Physical, chart, labs and discussed the procedure including the risks, benefits and alternatives for the proposed anesthesia with the patient or authorized representative who has indicated his/her understanding and acceptance.     Dental advisory given  Plan Discussed with: CRNA and Surgeon  Anesthesia Plan Comments: (  Discussed risks of anesthesia with patient, including possibility of difficulty with spontaneous ventilation under anesthesia necessitating airway intervention, PONV, and rare risks such as cardiac or respiratory or neurological events, and allergic reactions. Discussed the role of CRNA in patient's perioperative care. Patient understands. Patient informed about increased incidence of above perioperative risk due to high BMI. Patient understands.  )        Anesthesia Quick Evaluation

## 2023-09-16 NOTE — H&P (Signed)
Outpatient short stay form Pre-procedure 09/16/2023  Patricia Bill, MD  Primary Physician: Patricia Munroe, NP  Reason for visit:  History of BE and surveillance  History of present illness:    67 y/o lady with obesity, hypertension, and hyperlipidemia here for EGD/Colonoscopy for history of BE and history of adenomatous polyps. Last colonoscopy in 2017 was unremarkable. No blood thinners. No first degree relatives with GI malignancies. History of cholecystectomy.    Current Facility-Administered Medications:    0.9 %  sodium chloride infusion, , Intravenous, Continuous, Patricia Garner, Patricia Muskrat, MD, Last Rate: 20 mL/hr at 09/16/23 0705, 20 mL/hr at 09/16/23 0705  Medications Prior to Admission  Medication Sig Dispense Refill Last Dose   albuterol (VENTOLIN HFA) 108 (90 Base) MCG/ACT inhaler INHALE 1 TO 2 PUFFS BY MOUTH EVERY 6 HOURS AS NEEDED FOR WHEEZING OR SHORTNESS OF BREATH 8 g 5 Past Week   albuterol (VENTOLIN HFA) 108 (90 Base) MCG/ACT inhaler Inhale 2 puffs into the lungs every 6 (six) hours as needed for wheezing or shortness of breath.   Past Week   Apple Cider Vinegar 600 MG CAPS Take by mouth.   Past Week   APPLE CIDER VINEGAR PO Take by mouth.   Past Week   Ascorbic Acid (VITAMIN C) 1000 MG tablet Take 1,000 mg by mouth daily.   Past Week   Biotin 16109 MCG TABS Take by mouth.   Past Week   clobetasol (TEMOVATE) 0.05 % external solution Apply 1 Application topically 2 (two) times daily. Apply to aa's psoriasis in the scalp QD up to 5d/wk. Avoid applying to face, groin, and axilla. Use as directed. Long-term use can cause thinning of the skin. 50 mL 1 Past Week   clobetasol ointment (TEMOVATE) 0.05 % Apply to affected area every night for 4 weeks, then every other day for 4 weeks and then twice a week for 4 weeks or until resolution. 45 g 0 Past Week   cyanocobalamin (VITAMIN B12) 1000 MCG tablet Take 1,000 mcg by mouth daily.   Past Week   ferrous sulfate 325 (65 FE) MG EC  tablet Take 325 mg by mouth as needed.   Past Week   ibuprofen (ADVIL) 100 MG/5ML suspension Take 400 mg by mouth every 4 (four) hours as needed.   Past Week   lisinopril-hydrochlorothiazide (ZESTORETIC) 20-25 MG tablet TAKE 1 TABLET BY MOUTH ONCE DAILY (OFFICE  VISIT  REQUIRED  FOR  FURTHER  REFILLS) 90 tablet 1 09/15/2023   Moringa 500 MG CAPS Take by mouth.   Past Week   Multiple Vitamin (MULTIVITAMIN) tablet Take 1 tablet by mouth daily.      omega-3 fish oil (MAXEPA) 1000 MG CAPS capsule Take by mouth.   Past Week   pantoprazole (PROTONIX) 20 MG tablet Take 20 mg by mouth daily.   Past Week   pantoprazole (PROTONIX) 40 MG tablet Take 1 tablet (40 mg total) by mouth daily. 90 tablet 1 Past Week   triamcinolone (NASACORT) 55 MCG/ACT AERO nasal inhaler Place 2 sprays into the nose daily. 1 each 0 Past Week   VITAMIN D, CHOLECALCIFEROL, PO Take 1 capsule by mouth daily at 6 (six) AM.   Past Week   vitamin E 180 MG (400 UNITS) capsule Take 1,000 Units by mouth daily.   Past Week     Allergies  Allergen Reactions   Bactrim [Sulfamethoxazole-Trimethoprim] Nausea Only   Codeine Nausea And Vomiting   Hydrocodone Hives   Lipitor [Atorvastatin] Other (See Comments)  Myalgia    Medrol [Methylprednisolone] Nausea Only   Sulfa Antibiotics Nausea Only     Past Medical History:  Diagnosis Date   Allergy    Arthritis    Asthma    Barrett esophagus    Chicken pox    Chronic vaginitis    Dysplastic nevus 08/30/2016   upper back spinal, mild   GERD (gastroesophageal reflux disease)    History of colon polyps    Hyperlipidemia    Hypertension    Osteoporosis    Pre-diabetes    Psoriasis (a type of skin inflammation)    to the scalp   Vitamin D deficiency    Vulvar intraepithelial neoplasia (VIN) grade 1     Review of systems:  Otherwise negative.    Physical Exam  Gen: Alert, oriented. Appears stated age.  HEENT: PERRLA. Lungs: No respiratory distress CV: RRR Abd: soft,  benign, no masses Ext: No edema    Planned procedures: Proceed with EGD/colonoscopy. The patient understands the nature of the planned procedure, indications, risks, alternatives and potential complications including but not limited to bleeding, infection, perforation, damage to internal organs and possible oversedation/side effects from anesthesia. The patient agrees and gives consent to proceed.  Please refer to procedure notes for findings, recommendations and patient disposition/instructions.     Patricia Bill, MD [facility]

## 2023-09-16 NOTE — Interval H&P Note (Signed)
History and Physical Interval Note:  09/16/2023 7:50 AM  Patricia Garner  has presented today for surgery, with the diagnosis of hx of adenomatous polyp of colon,Barrett's esophagus.  The various methods of treatment have been discussed with the patient and family. After consideration of risks, benefits and other options for treatment, the patient has consented to  Procedure(s): COLONOSCOPY WITH PROPOFOL (N/A) ESOPHAGOGASTRODUODENOSCOPY (EGD) WITH PROPOFOL (N/A) as a surgical intervention.  The patient's history has been reviewed, patient examined, no change in status, stable for surgery.  I have reviewed the patient's chart and labs.  Questions were answered to the patient's satisfaction.     [name]  Ok to proceed with EGD/Colonoscopy

## 2023-09-17 ENCOUNTER — Encounter: Payer: Self-pay | Admitting: Gastroenterology

## 2023-09-19 LAB — SURGICAL PATHOLOGY

## 2023-10-17 DIAGNOSIS — H02409 Unspecified ptosis of unspecified eyelid: Secondary | ICD-10-CM | POA: Diagnosis not present

## 2023-11-06 ENCOUNTER — Ambulatory Visit: Payer: PPO | Admitting: Internal Medicine

## 2023-11-06 NOTE — Progress Notes (Deleted)
Subjective:    Patient ID: Patricia Garner, female    DOB: 1956/02/25, 67 y.o.   MRN: [id number]  HPI  Patient presents to clinic today for 56-month follow-up of chronic conditions.  GERD with Barrett's esophagus: Triggered by spicy foods and eating late at night.  She takes pantoprazole only as needed.  Upper GI from 09/2023 reviewed.  HTN: Her BP today is.  She is taking lisinopril HCT as prescribed.  ECG from 03/2023 reviewed.  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  She takes tylenol arthritis as needed with some relief of symptoms.  She follows with orthopedics.  Osteoporosis: She is taking calcium and vitamin D OTC.  She has not been getting weightbearing exercise.  Bone density from 03/2023 reviewed.  Asthma: She denies chronic cough or shortness of breath.  She uses albuterol only as needed with good relief of symptoms.  There are no PFTs on file.  Iron deficiency anemia: Her last H/H was 11.7/36.3, 05/2023.  She takes iron only as needed.  She does not follow with hematology.  HLD: Her last LDL was 166, triglycerides 233, 05/2023.  She is not taking any cholesterol-lowering medication at this time.  She does not consume a low-fat diet.  Prediabetes: Her last A1c was 6.2%, 05/2023.  She is not taking any oral diabetic medication at this time.  She does not check her sugars.  Review of Systems     Past Medical History:  Diagnosis Date   Allergy    Arthritis    Asthma    Barrett esophagus    Chicken pox    Chronic vaginitis    Dysplastic nevus 08/30/2016   upper back spinal, mild   GERD (gastroesophageal reflux disease)    History of colon polyps    Hyperlipidemia    Hypertension    Osteoporosis    Pre-diabetes    Psoriasis (a type of skin inflammation)    to the scalp   Vitamin D deficiency    Vulvar intraepithelial neoplasia (VIN) grade 1     Current Outpatient Medications  Medication Sig Dispense Refill   albuterol (VENTOLIN HFA) 108 (90 Base) MCG/ACT inhaler INHALE 1 TO 2  PUFFS BY MOUTH EVERY 6 HOURS AS NEEDED FOR WHEEZING OR SHORTNESS OF BREATH 8 g 5   albuterol (VENTOLIN HFA) 108 (90 Base) MCG/ACT inhaler Inhale 2 puffs into the lungs every 6 (six) hours as needed for wheezing or shortness of breath.     Apple Cider Vinegar 600 MG CAPS Take by mouth.     APPLE CIDER VINEGAR PO Take by mouth.     Ascorbic Acid (VITAMIN C) 1000 MG tablet Take 1,000 mg by mouth daily.     Biotin 78295 MCG TABS Take by mouth.     clobetasol (TEMOVATE) 0.05 % external solution Apply 1 Application topically 2 (two) times daily. Apply to aa's psoriasis in the scalp QD up to 5d/wk. Avoid applying to face, groin, and axilla. Use as directed. Long-term use can cause thinning of the skin. 50 mL 1   clobetasol ointment (TEMOVATE) 0.05 % Apply to affected area every night for 4 weeks, then every other day for 4 weeks and then twice a week for 4 weeks or until resolution. 45 g 0   cyanocobalamin (VITAMIN B12) 1000 MCG tablet Take 1,000 mcg by mouth daily.     ferrous sulfate 325 (65 FE) MG EC tablet Take 325 mg by mouth as needed.     ibuprofen (ADVIL) 100 MG/5ML  suspension Take 400 mg by mouth every 4 (four) hours as needed.     lisinopril-hydrochlorothiazide (ZESTORETIC) 20-25 MG tablet TAKE 1 TABLET BY MOUTH ONCE DAILY (OFFICE  VISIT  REQUIRED  FOR  FURTHER  REFILLS) 90 tablet 1   Moringa 500 MG CAPS Take by mouth.     Multiple Vitamin (MULTIVITAMIN) tablet Take 1 tablet by mouth daily.     omega-3 fish oil (MAXEPA) 1000 MG CAPS capsule Take by mouth.     pantoprazole (PROTONIX) 20 MG tablet Take 20 mg by mouth daily.     pantoprazole (PROTONIX) 40 MG tablet Take 1 tablet (40 mg total) by mouth daily. 90 tablet 1   triamcinolone (NASACORT) 55 MCG/ACT AERO nasal inhaler Place 2 sprays into the nose daily. 1 each 0   VITAMIN D, CHOLECALCIFEROL, PO Take 1 capsule by mouth daily at 6 (six) AM.     vitamin E 180 MG (400 UNITS) capsule Take 1,000 Units by mouth daily.     No current  facility-administered medications for this visit.    Allergies  Allergen Reactions   Bactrim [Sulfamethoxazole-Trimethoprim] Nausea Only   Codeine Nausea And Vomiting   Hydrocodone Hives   Lipitor [Atorvastatin] Other (See Comments)    Myalgia    Medrol [Methylprednisolone] Nausea Only   Sulfa Antibiotics Nausea Only    Family History  Problem Relation Age of Onset   Arthritis Mother    Heart disease Mother    Stroke Mother    Hypertension Mother    Alcohol abuse Father    Alcohol abuse Sister    Mental illness Sister    Stomach cancer Sister 62   Alcohol abuse Brother    Drug abuse Brother    Arthritis Maternal Grandmother    Stroke Maternal Grandmother    Colon cancer Maternal Grandfather 75   Breast cancer Neg Hx     Social History   Socioeconomic History   Marital status: Married    Spouse name: Not on file   Number of children: Not on file   Years of education: Not on file   Highest education level: Not on file  Occupational History   Not on file  Tobacco Use   Smoking status: Never   Smokeless tobacco: Never  Vaping Use   Vaping status: Never Used  Substance and Sexual Activity   Alcohol use: No    Alcohol/week: 0.0 standard drinks of alcohol   Drug use: Never   Sexual activity: Yes    Birth control/protection: Post-menopausal  Other Topics Concern   Not on file  Social History Narrative   Married and  Lives at home  with husband .   Social Determinants of Health   Financial Resource Strain: Not on file  Food Insecurity: Not on file  Transportation Needs: Not on file  Physical Activity: Not on file  Stress: Not on file  Social Connections: Not on file  Intimate Partner Violence: Not on file     Constitutional: Denies fever, malaise, fatigue, headache or abrupt weight changes.  HEENT: Denies eye pain, eye redness, ear pain, ringing in the ears, wax buildup, runny nose, nasal congestion, bloody nose, or sore throat. Respiratory: Denies  difficulty breathing, shortness of breath, cough or sputum production.   Cardiovascular: Denies chest pain, chest tightness, palpitations or swelling in the hands or feet.  Gastrointestinal: Denies abdominal pain, bloating, constipation, diarrhea or blood in the stool.  GU: Denies urgency, frequency, pain with urination, burning sensation, blood in urine, odor  or discharge. Musculoskeletal: Patient reports joint pain.  Denies decrease in range of motion, difficulty with gait, muscle pain or joint swelling.  Skin: Denies redness, rashes, lesions or ulcercations.  Neurological: Denies dizziness, difficulty with memory, difficulty with speech or problems with balance and coordination.  Psych: Denies anxiety, depression, SI/HI.  No other specific complaints in a complete review of systems (except as listed in HPI above).  Objective:   Physical Exam  There were no vitals taken for this visit. Wt Readings from Last 3 Encounters:  09/16/23 257 lb 12.8 oz (116.9 kg)  06/20/23 261 lb (118.4 kg)  06/10/23 263 lb (119.3 kg)    General: Appears their stated age, well developed, well nourished in NAD. Skin: Warm, dry and intact. No rashes, lesions or ulcerations noted. HEENT: Head: normal shape and size; Eyes: sclera white, no icterus, conjunctiva pink, PERRLA and EOMs intact; Ears: Tm's gray and intact, normal light reflex; Nose: mucosa pink and moist, septum midline; Throat/Mouth: Teeth present, mucosa pink and moist, no exudate, lesions or ulcerations noted.  Neck:  Neck supple, trachea midline. No masses, lumps or thyromegaly present.  Cardiovascular: Normal rate and rhythm. S1,S2 noted.  No murmur, rubs or gallops noted. No JVD or BLE edema. No carotid bruits noted. Pulmonary/Chest: Normal effort and positive vesicular breath sounds. No respiratory distress. No wheezes, rales or ronchi noted.  Abdomen: Soft and nontender. Normal bowel sounds. No distention or masses noted. Liver, spleen and  kidneys non palpable. Musculoskeletal: Normal range of motion. No signs of joint swelling. No difficulty with gait.  Neurological: Alert and oriented. Cranial nerves II-XII grossly intact. Coordination normal.  Psychiatric: Mood and affect normal. Behavior is normal. Judgment and thought content normal.    BMET    Component Value Date/Time   NA 138 05/07/2023 0913   K 4.2 05/07/2023 0913   CL 101 05/07/2023 0913   CO2 28 05/07/2023 0913   GLUCOSE 109 05/07/2023 0913   BUN 22 05/07/2023 0913   CREATININE 0.69 05/07/2023 0913   CALCIUM 10.0 05/07/2023 0913    Lipid Panel     Component Value Date/Time   CHOL 248 (H) 05/07/2023 0913   TRIG 213 (H) 05/07/2023 0913   HDL 44 (L) 05/07/2023 0913   CHOLHDL 5.6 (H) 05/07/2023 0913   VLDL 30.6 09/01/2020 1440   LDLCALC 166 (H) 05/07/2023 0913    CBC    Component Value Date/Time   WBC 7.9 05/07/2023 0913   RBC 3.93 05/07/2023 0913   HGB 11.7 05/07/2023 0913   HCT 36.3 05/07/2023 0913   PLT 284 05/07/2023 0913   MCV 92.4 05/07/2023 0913   MCH 29.8 05/07/2023 0913   MCHC 32.2 05/07/2023 0913   RDW 12.2 05/07/2023 0913    Hgb A1C Lab Results  Component Value Date   HGBA1C 6.2 (H) 05/07/2023            Assessment & Plan:     RTC in 6 months for your annual exam Nicki Reaper, NP

## 2023-12-01 ENCOUNTER — Other Ambulatory Visit: Payer: Self-pay | Admitting: Internal Medicine

## 2023-12-01 DIAGNOSIS — I10 Essential (primary) hypertension: Secondary | ICD-10-CM

## 2023-12-01 DIAGNOSIS — Z0001 Encounter for general adult medical examination with abnormal findings: Secondary | ICD-10-CM

## 2023-12-04 NOTE — Telephone Encounter (Signed)
Requested medication (s) are due for refill today: Yes  Requested medication (s) are on the active medication list: Yes  Last refill:  05/07/23  Future visit scheduled: No (pt notified via MyChart appt needed)  Notes to clinic:  Unable to refill per protocol, Rx expired for protonix. Unable to refill per protocol, appointment needed.      Requested Prescriptions  Pending Prescriptions Disp Refills   lisinopril-hydrochlorothiazide (ZESTORETIC) 20-25 MG tablet [Pharmacy Med Name: Lisinopril-hydroCHLOROthiazide 20-25 MG Oral Tablet] 90 tablet 0    Sig: TAKE 1 TABLET BY MOUTH ONCE DAILY (OFFICE  VISIT  REQUIRED  FOR  FURTHER  REFILLS)     Cardiovascular:  ACEI + Diuretic Combos Failed - 12/01/2023  9:43 AM      Failed - Na in normal range and within 180 days    Sodium  Date Value Ref Range Status  05/07/2023 138 135 - 146 mmol/L Final         Failed - K in normal range and within 180 days    Potassium  Date Value Ref Range Status  05/07/2023 4.2 3.5 - 5.3 mmol/L Final         Failed - Cr in normal range and within 180 days    Creat  Date Value Ref Range Status  05/07/2023 0.69 0.50 - 1.05 mg/dL Final         Failed - eGFR is 30 or above and within 180 days    GFR  Date Value Ref Range Status  09/01/2020 72.49 >60.00 mL/min Final   eGFR  Date Value Ref Range Status  05/07/2023 96 > OR = 60 mL/min/1.28m2 Final         Failed - Valid encounter within last 6 months    Recent Outpatient Visits           5 months ago Viral sinusitis   [place] [facility], Salvadore Oxford, NP   7 months ago Encounter for general adult medical examination with abnormal findings   [place] [facility] [place], Salvadore Oxford, NP   1 year ago Prediabetes   [place] [facility] [place], Salvadore Oxford, NP   2 years ago Prediabetes   [place] [facility] [place], Salvadore Oxford, NP       Future Appointments             In 3 months  Deirdre Evener, MD [place] [facility]            Passed - Patient is not pregnant      Passed - Last BP in normal range    BP Readings from Last 1 Encounters:  09/16/23 130/74          pantoprazole (PROTONIX) 40 MG tablet [Pharmacy Med Name: Pantoprazole Sodium 40 MG Oral Tablet Delayed Release] 90 tablet 0    Sig: Take 1 tablet by mouth once daily     Gastroenterology: Proton Pump Inhibitors Failed - 12/01/2023  9:43 AM      Failed - Valid encounter within last 12 months    Recent Outpatient Visits           5 months ago Viral sinusitis   [place] [facility], Salvadore Oxford, NP   7 months ago Encounter for general adult medical examination with abnormal findings    [place] [facility] [place], Salvadore Oxford, NP   1 year ago Prediabetes  [place] [facility], Salvadore Oxford, NP   2 years ago Prediabetes   [place] [facility] [place], Salvadore Oxford, NP       Future Appointments             In 3 months Deirdre Evener, MD [facility]

## 2023-12-09 ENCOUNTER — Other Ambulatory Visit: Payer: Self-pay | Admitting: Internal Medicine

## 2023-12-09 DIAGNOSIS — I10 Essential (primary) hypertension: Secondary | ICD-10-CM

## 2023-12-09 DIAGNOSIS — Z0001 Encounter for general adult medical examination with abnormal findings: Secondary | ICD-10-CM

## 2023-12-09 NOTE — Telephone Encounter (Signed)
Medication Refill -  Most Recent Primary Care Visit:  Provider: Lorre Munroe  Department: [facility]  Visit Type: SAME DAY  Date: 06/20/2023  Medication: pantoprazole (PROTONIX) 40 MG tablet [[id number]  lisinopril-hydrochlorothiazide (ZESTORETIC) 20-25 MG tablet [[id number]]  Has the patient contacted their pharmacy? Yes (Agent: If no, request that the patient contact the pharmacy for the refill. If patient does not wish to contact the pharmacy document the reason why and proceed with request.) (Agent: If yes, when and what did the pharmacy advise?)  Is this the correct pharmacy for this prescription? Yes If no, delete pharmacy and type the correct one.  This is the patient's preferred pharmacy:  [facility] [address] [place] - [address] [place] [address] Phone: [phone] Fax: [phone]   Is the patient out of the medication? No has enough until wednesday   Has the patient been seen for an appointment in the last year OR does the patient have an upcoming appointment? Yes  Can we respond through MyChart? No  Agent: Please be advised that Rx refills may take up to 3 business days. We ask that you follow-up with your pharmacy.

## 2023-12-10 ENCOUNTER — Telehealth: Payer: Self-pay | Admitting: Internal Medicine

## 2023-12-10 ENCOUNTER — Other Ambulatory Visit: Payer: Self-pay | Admitting: Internal Medicine

## 2023-12-10 DIAGNOSIS — I10 Essential (primary) hypertension: Secondary | ICD-10-CM

## 2023-12-10 DIAGNOSIS — Z0001 Encounter for general adult medical examination with abnormal findings: Secondary | ICD-10-CM

## 2023-12-10 MED ORDER — PANTOPRAZOLE SODIUM 40 MG PO TBEC
40.0000 mg | DELAYED_RELEASE_TABLET | Freq: Every day | ORAL | 0 refills | Status: DC
Start: 2023-12-10 — End: 2023-12-23

## 2023-12-10 MED ORDER — LISINOPRIL-HYDROCHLOROTHIAZIDE 20-25 MG PO TABS
ORAL_TABLET | ORAL | 0 refills | Status: DC
Start: 2023-12-10 — End: 2023-12-23

## 2023-12-10 NOTE — Telephone Encounter (Signed)
Already refilled 12/10/23 in a separate refill encounter.

## 2023-12-10 NOTE — Telephone Encounter (Signed)
Duplicate, already refilled 12/10/23.  Requested Prescriptions  Pending Prescriptions Disp Refills   lisinopril-hydrochlorothiazide (ZESTORETIC) 20-25 MG tablet [Pharmacy Med Name: Lisinopril-hydroCHLOROthiazide 20-25 MG Oral Tablet] 90 tablet 0    Sig: TAKE 1 TABLET BY MOUTH ONCE DAILY (OFFICE  VISIT  REQUIRED  FOR  FURTHER  REFILLS)     Cardiovascular:  ACEI + Diuretic Combos Failed - 12/10/2023  1:27 PM      Failed - Na in normal range and within 180 days    Sodium  Date Value Ref Range Status  05/07/2023 138 135 - 146 mmol/L Final         Failed - K in normal range and within 180 days    Potassium  Date Value Ref Range Status  05/07/2023 4.2 3.5 - 5.3 mmol/L Final         Failed - Cr in normal range and within 180 days    Creat  Date Value Ref Range Status  05/07/2023 0.69 0.50 - 1.05 mg/dL Final         Failed - eGFR is 30 or above and within 180 days    GFR  Date Value Ref Range Status  09/01/2020 72.49 >60.00 mL/min Final   eGFR  Date Value Ref Range Status  05/07/2023 96 > OR = 60 mL/min/1.32m2 Final         Failed - Valid encounter within last 6 months    Recent Outpatient Visits           5 months ago Viral sinusitis   [place] [facility] [place], Salvadore Oxford, NP   7 months ago Encounter for general adult medical examination with abnormal findings   [place] [facility] [place], Salvadore Oxford, NP   1 year ago Prediabetes   [place] [facility] [place], Salvadore Oxford, NP   2 years ago Prediabetes   [place] [facility] [place], Salvadore Oxford, NP       Future Appointments             In 1 week Sampson Si, Salvadore Oxford, NP [facility] [place], [place]   In 3 months Deirdre Evener, MD [facility]            Passed - Patient is not pregnant      Passed - Last BP in normal range    BP Readings from Last 1 Encounters:  09/16/23 130/74

## 2023-12-10 NOTE — Telephone Encounter (Signed)
Courtesy refill given until upcoming appointment 12/23/23.  Requested Prescriptions  Pending Prescriptions Disp Refills   pantoprazole (PROTONIX) 40 MG tablet 15 tablet 0    Sig: Take 1 tablet (40 mg total) by mouth daily. OFFICE VISIT NEEDED FOR ADDITIONAL REFILLS     Gastroenterology: Proton Pump Inhibitors Failed - 12/09/2023  5:45 PM      Failed - Valid encounter within last 12 months    Recent Outpatient Visits           5 months ago Viral sinusitis   [place] [facility] [place], [place], NP   7 months ago Encounter for general adult medical examination with abnormal findings   [place] [facility] [place], Salvadore Oxford, NP   1 year ago Prediabetes   [place] [facility] [place], Salvadore Oxford, NP   2 years ago Prediabetes   [place] [facility] [place], Salvadore Oxford, NP       Future Appointments             In 1 week Sampson Si, Salvadore Oxford, NP [place] [facility], [place]   In 3 months Deirdre Evener, MD [place] [facility]             lisinopril-hydrochlorothiazide (ZESTORETIC) 20-25 MG tablet 15 tablet 0    Sig: TAKE 1 TABLET BY MOUTH ONCE DAILY (OFFICE  VISIT  REQUIRED  FOR  FURTHER  REFILLS)     Cardiovascular:  ACEI + Diuretic Combos Failed - 12/09/2023  5:45 PM      Failed - Na in normal range and within 180 days    Sodium  Date Value Ref Range Status  05/07/2023 138 135 - 146 mmol/L Final         Failed - K in normal range and within 180 days    Potassium  Date Value Ref Range Status  05/07/2023 4.2 3.5 - 5.3 mmol/L Final         Failed - Cr in normal range and within 180 days    Creat  Date Value Ref Range Status  05/07/2023 0.69 0.50 - 1.05 mg/dL Final         Failed - eGFR is 30 or above and within 180 days    GFR  Date Value Ref Range Status  09/01/2020 72.49 >60.00 mL/min Final   eGFR  Date Value Ref Range Status  05/07/2023 96 > OR = 60 mL/min/1.24m2  Final         Failed - Valid encounter within last 6 months    Recent Outpatient Visits           5 months ago Viral sinusitis   [place] [facility] [place], Salvadore Oxford, NP   7 months ago Encounter for general adult medical examination with abnormal findings   [place] [facility] [place], Salvadore Oxford, NP   1 year ago Prediabetes   [place] [facility] [place], Salvadore Oxford, NP   2 years ago Prediabetes   [place] [facility] [place], Salvadore Oxford, NP       Future Appointments             In 1 week Sampson Si, Salvadore Oxford, NP [place] [facility]   In 3 months Deirdre Evener, MD [facility]  Passed - Patient is not pregnant      Passed - Last BP in normal range    BP Readings from Last 1 Encounters:  09/16/23 130/74

## 2023-12-10 NOTE — Telephone Encounter (Addendum)
Medication Refill -  Most Recent Primary Care Visit:  Provider: Lorre Munroe  Department: [facility]  Visit Type: SAME DAY  Date: 06/20/2023  Medication:  lisinopril-hydrochlorothiazide (ZESTORETIC) 20-25 MG tablet  pantoprazole (PROTONIX) 40 MG tablet  *Patient will be out of medication tomorrow and needs a refill up until her appointment date on 12.23.24  See RX Refill Encounter from 12.9.24   Has the patient contacted their pharmacy? Yes, advised to contact PCP  Is this the correct pharmacy for this prescription? Yes, the one listed below  This is the patient's preferred pharmacy:  [facility] [id number] [place] [address] Phone: [phone] Fax: [phone]   Has the prescription been filled recently? Receipt confirmed by pharmacy (05/07/2023  9:10 AM EDT)   Is the patient out of the medication?  Patient will be out of medication tomorrow.  Has the patient been seen for an appointment in the last year OR does the patient have an upcoming appointment? Yes. Patient has F/U appointment scheduled for 12.23.24

## 2023-12-23 ENCOUNTER — Encounter: Payer: Self-pay | Admitting: Internal Medicine

## 2023-12-23 ENCOUNTER — Ambulatory Visit: Payer: PPO | Admitting: Internal Medicine

## 2023-12-23 ENCOUNTER — Ambulatory Visit (INDEPENDENT_AMBULATORY_CARE_PROVIDER_SITE_OTHER): Payer: PPO | Admitting: Internal Medicine

## 2023-12-23 VITALS — BP 130/72 | Ht 63.0 in | Wt 261.2 lb

## 2023-12-23 DIAGNOSIS — K219 Gastro-esophageal reflux disease without esophagitis: Secondary | ICD-10-CM

## 2023-12-23 DIAGNOSIS — E782 Mixed hyperlipidemia: Secondary | ICD-10-CM

## 2023-12-23 DIAGNOSIS — Z0001 Encounter for general adult medical examination with abnormal findings: Secondary | ICD-10-CM

## 2023-12-23 DIAGNOSIS — J4520 Mild intermittent asthma, uncomplicated: Secondary | ICD-10-CM | POA: Diagnosis not present

## 2023-12-23 DIAGNOSIS — I739 Peripheral vascular disease, unspecified: Secondary | ICD-10-CM

## 2023-12-23 DIAGNOSIS — E611 Iron deficiency: Secondary | ICD-10-CM

## 2023-12-23 DIAGNOSIS — M816 Localized osteoporosis [Lequesne]: Secondary | ICD-10-CM | POA: Diagnosis not present

## 2023-12-23 DIAGNOSIS — E66813 Obesity, class 3: Secondary | ICD-10-CM

## 2023-12-23 DIAGNOSIS — R7303 Prediabetes: Secondary | ICD-10-CM | POA: Diagnosis not present

## 2023-12-23 DIAGNOSIS — I10 Essential (primary) hypertension: Secondary | ICD-10-CM

## 2023-12-23 DIAGNOSIS — K2270 Barrett's esophagus without dysplasia: Secondary | ICD-10-CM | POA: Diagnosis not present

## 2023-12-23 DIAGNOSIS — M170 Bilateral primary osteoarthritis of knee: Secondary | ICD-10-CM

## 2023-12-23 DIAGNOSIS — Z6841 Body Mass Index (BMI) 40.0 and over, adult: Secondary | ICD-10-CM

## 2023-12-23 MED ORDER — LISINOPRIL-HYDROCHLOROTHIAZIDE 20-25 MG PO TABS
ORAL_TABLET | ORAL | 1 refills | Status: DC
Start: 2023-12-23 — End: 2024-06-23

## 2023-12-23 MED ORDER — PANTOPRAZOLE SODIUM 40 MG PO TBEC
40.0000 mg | DELAYED_RELEASE_TABLET | Freq: Every day | ORAL | 1 refills | Status: DC
Start: 2023-12-23 — End: 2024-07-28

## 2023-12-23 NOTE — Progress Notes (Signed)
Subjective:    Patient ID: Patricia Garner, female    DOB: [birth year], 67 y.o.   MRN: [id number]  HPI  Patient presents to clinic today for follow-up of chronic conditions.  GERD with barrett's esophagus: Triggered by spicy foods and eating late at night.  She takes pantoprazole as prescribed.  Upper GI from 09/2023 reviewed.  HTN: Her BP today is 130/72.  She is taking lisinopril hct as prescribed.  ECG from 03/2023 reviewed.  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  She takes tylenol arthritis as needed with some relief of symptoms.  She follows with orthopedics.  Osteoporosis: She is taking calcium and vitamin d OTC.  She has not been getting weightbearing exercise daily.  Bone density from 03/2023 reviewed.  Asthma: She denies chronic cough or shortness of breath.  She uses albuterol as needed with good relief of symptoms.  There are no PFTs on file.  Iron deficiency anemia: Her last H/H was 11.7/36.3, 05/2023.  She takes oral iron only as needed.  She does not follow with hematology.  HLD: Her last LDL was 166, triglycerides 213, 05/2023.  She is not taking any cholesterol-lowering medication at this time.  She does not consume a low-fat diet.  Prediabetes: Her last A1c was 6.2%, 05/2023.  She is not taking any oral diabetic medication at this time.  She does not check her sugars.  Review of Systems  Past Medical History:  Diagnosis Date   Allergy    Arthritis    Asthma    Barrett esophagus    Chicken pox    Chronic vaginitis    Dysplastic nevus 08/30/2016   upper back spinal, mild   GERD (gastroesophageal reflux disease)    History of colon polyps    Hyperlipidemia    Hypertension    Osteoporosis    Pre-diabetes    Psoriasis (a type of skin inflammation)    to the scalp   Vitamin D deficiency    Vulvar intraepithelial neoplasia (VIN) grade 1     Current Outpatient Medications  Medication Sig Dispense Refill   albuterol (VENTOLIN HFA) 108 (90 Base) MCG/ACT inhaler INHALE 1 TO 2  PUFFS BY MOUTH EVERY 6 HOURS AS NEEDED FOR WHEEZING OR SHORTNESS OF BREATH 8 g 5   albuterol (VENTOLIN HFA) 108 (90 Base) MCG/ACT inhaler Inhale 2 puffs into the lungs every 6 (six) hours as needed for wheezing or shortness of breath.     Apple Cider Vinegar 600 MG CAPS Take by mouth.     APPLE CIDER VINEGAR PO Take by mouth.     Ascorbic Acid (VITAMIN C) 1000 MG tablet Take 1,000 mg by mouth daily.     Biotin 40981 MCG TABS Take by mouth.     clobetasol (TEMOVATE) 0.05 % external solution Apply 1 Application topically 2 (two) times daily. Apply to aa's psoriasis in the scalp QD up to 5d/wk. Avoid applying to face, groin, and axilla. Use as directed. Long-term use can cause thinning of the skin. 50 mL 1   clobetasol ointment (TEMOVATE) 0.05 % Apply to affected area every night for 4 weeks, then every other day for 4 weeks and then twice a week for 4 weeks or until resolution. 45 g 0   cyanocobalamin (VITAMIN B12) 1000 MCG tablet Take 1,000 mcg by mouth daily.     ferrous sulfate 325 (65 FE) MG EC tablet Take 325 mg by mouth as needed.     ibuprofen (ADVIL) 100 MG/5ML suspension Take 400  mg by mouth every 4 (four) hours as needed.     lisinopril-hydrochlorothiazide (ZESTORETIC) 20-25 MG tablet TAKE 1 TABLET BY MOUTH ONCE DAILY (OFFICE  VISIT  REQUIRED  FOR  FURTHER  REFILLS) 15 tablet 0   [place] 500 MG CAPS Take by mouth.     Multiple Vitamin (MULTIVITAMIN) tablet Take 1 tablet by mouth daily.     omega-3 fish oil (MAXEPA) 1000 MG CAPS capsule Take by mouth.     pantoprazole (PROTONIX) 20 MG tablet Take 20 mg by mouth daily.     pantoprazole (PROTONIX) 40 MG tablet Take 1 tablet (40 mg total) by mouth daily. OFFICE VISIT NEEDED FOR ADDITIONAL REFILLS 15 tablet 0   triamcinolone (NASACORT) 55 MCG/ACT AERO nasal inhaler Place 2 sprays into the nose daily. 1 each 0   VITAMIN D, CHOLECALCIFEROL, PO Take 1 capsule by mouth daily at 6 (six) AM.     vitamin E 180 MG (400 UNITS) capsule Take 1,000 Units  by mouth daily.     No current facility-administered medications for this visit.    Allergies  Allergen Reactions   Bactrim [Sulfamethoxazole-Trimethoprim] Nausea Only   Codeine Nausea And Vomiting   Hydrocodone Hives   Lipitor [Atorvastatin] Other (See Comments)    Myalgia    Medrol [Methylprednisolone] Nausea Only   Sulfa Antibiotics Nausea Only    Family History  Problem Relation Age of Onset   Arthritis Mother    Heart disease Mother    Stroke Mother    Hypertension Mother    Alcohol abuse Father    Alcohol abuse Sister    Mental illness Sister    Stomach cancer Sister 63   Alcohol abuse Brother    Drug abuse Brother    Arthritis Maternal Grandmother    Stroke Maternal Grandmother    Colon cancer Maternal Grandfather 63   Breast cancer Neg Hx     Social History   Socioeconomic History   Marital status: Married    Spouse name: Not on file   Number of children: Not on file   Years of education: Not on file   Highest education level: Not on file  Occupational History   Not on file  Tobacco Use   Smoking status: Never   Smokeless tobacco: Never  Vaping Use   Vaping status: Never Used  Substance and Sexual Activity   Alcohol use: No    Alcohol/week: 0.0 standard drinks of alcohol   Drug use: Never   Sexual activity: Yes    Birth control/protection: Post-menopausal  Other Topics Concern   Not on file  Social History Narrative   Married and  Lives at home  with husband .   Social Drivers of Corporate investment banker Strain: Not on file  Food Insecurity: Not on file  Transportation Needs: Not on file  Physical Activity: Not on file  Stress: Not on file  Social Connections: Not on file  Intimate Partner Violence: Not on file     Constitutional: Denies fever, malaise, fatigue, headache or abrupt weight changes.  HEENT: Denies eye pain, eye redness, ear pain, ringing in the ears, wax buildup, runny nose, nasal congestion, bloody nose, or sore  throat. Respiratory: Denies difficulty breathing, shortness of breath, cough or sputum production.   Cardiovascular: Denies chest pain, chest tightness, palpitations or swelling in the hands or feet.  Gastrointestinal: Patient reports intermittent reflux.  Denies abdominal pain, bloating, constipation, diarrhea or blood in the stool.  GU: Denies urgency, frequency, pain  with urination, burning sensation, blood in urine, odor or discharge. Musculoskeletal: Patient reports knee pain.  Denies decrease in range of motion, difficulty with gait, muscle pain or joint swelling.  Skin:  Denies redness, rashes, lesions or ulcercations.  Neurological: Denies dizziness, difficulty with memory, difficulty with speech or problems with balance and coordination.  Psych: Denies anxiety, depression, SI/HI.  No other specific complaints in a complete review of systems (except as listed in HPI above).     Objective:   Physical Exam  BP 130/72 (BP Location: Left Arm, Patient Position: Sitting, Cuff Size: Large)   Ht 5\' 3"  (1.6 m)   Wt 261 lb 3.2 oz (118.5 kg)   BMI 46.27 kg/m    Wt Readings from Last 3 Encounters:  09/16/23 257 lb 12.8 oz (116.9 kg)  06/20/23 261 lb (118.4 kg)  06/10/23 263 lb (119.3 kg)    General: Appears her stated age, obese, in NAD. Skin: Warm, dry and intact.  No ulcerations noted. HEENT: Head: normal shape and size; Eyes: sclera white, no icterus, conjunctiva pink, PERRLA and EOMs intact;  Neck:  Neck supple, trachea midline. No masses, lumps or thyromegaly present.  Cardiovascular: Normal rate and rhythm. S1,S2 noted.  No murmur, rubs or gallops noted. No JVD or BLE edema.  Pulmonary/Chest: Normal effort and positive vesicular breath sounds. No respiratory distress. No wheezes, rales or ronchi noted.  Abdomen: Soft and nontender. Normal bowel sounds.  Musculoskeletal: No difficulty with gait.  Neurological: Alert and oriented. Cranial nerves II-XII grossly intact.  Coordination normal.  Psychiatric: Mood and affect normal. Behavior is normal. Judgment and thought content normal.    BMET    Component Value Date/Time   NA 138 05/07/2023 0913   K 4.2 05/07/2023 0913   CL 101 05/07/2023 0913   CO2 28 05/07/2023 0913   GLUCOSE 109 05/07/2023 0913   BUN 22 05/07/2023 0913   CREATININE 0.69 05/07/2023 0913   CALCIUM 10.0 05/07/2023 0913    Lipid Panel     Component Value Date/Time   CHOL 248 (H) 05/07/2023 0913   TRIG 213 (H) 05/07/2023 0913   HDL 44 (L) 05/07/2023 0913   CHOLHDL 5.6 (H) 05/07/2023 0913   VLDL 30.6 09/01/2020 1440   LDLCALC 166 (H) 05/07/2023 0913    CBC    Component Value Date/Time   WBC 7.9 05/07/2023 0913   RBC 3.93 05/07/2023 0913   HGB 11.7 05/07/2023 0913   HCT 36.3 05/07/2023 0913   PLT 284 05/07/2023 0913   MCV 92.4 05/07/2023 0913   MCH 29.8 05/07/2023 0913   MCHC 32.2 05/07/2023 0913   RDW 12.2 05/07/2023 0913    Hgb A1C Lab Results  Component Value Date   HGBA1C 6.2 (H) 05/07/2023          Assessment & Plan:    RTC in 5 months for annual exam Nicki Reaper, NP

## 2023-12-23 NOTE — Assessment & Plan Note (Signed)
Encourage weight loss as this can help reduce joint pain Continue Tylenol arthritis as needed

## 2023-12-23 NOTE — Assessment & Plan Note (Signed)
Bone density reviewed Continue calcium and vitamin D OTC Encourage daily weightbearing exercise

## 2023-12-23 NOTE — Assessment & Plan Note (Signed)
Continue pantoprazole Encouraged her to avoid foods that trigger reflux Encouraged weight loss as this can help reduce reflux symptoms

## 2023-12-23 NOTE — Patient Instructions (Signed)
Prediabetes Eating Plan Prediabetes is a condition that causes blood sugar (glucose) levels to be higher than normal. This increases the risk for developing type 2 diabetes (type 2 diabetes mellitus). Working with a health care provider or nutrition specialist (dietitian) to make diet and lifestyle changes can help prevent the onset of diabetes. These changes may help you: Control your blood glucose levels. Improve your cholesterol levels. Manage your blood pressure. What are tips for following this plan? Reading food labels Read food labels to check the amount of fat, salt (sodium), and sugar in prepackaged foods. Avoid foods that have: Saturated fats. Trans fats. Added sugars. Avoid foods that have more than 300 milligrams (mg) of sodium per serving. Limit your sodium intake to less than 2,300 mg each day. Shopping Avoid buying pre-made and processed foods. Avoid buying drinks with added sugar. Cooking Cook with olive oil. Do not use butter, lard, or ghee. Bake, broil, grill, steam, or boil foods. Avoid frying. Meal planning  Work with your dietitian to create an eating plan that is right for you. This may include tracking how many calories you take in each day. Use a food diary, notebook, or mobile application to track what you eat at each meal. Consider following a Mediterranean diet. This includes: Eating several servings of fresh fruits and vegetables each day. Eating fish at least twice a week. Eating one serving each day of whole grains, beans, nuts, and seeds. Using olive oil instead of other fats. Limiting alcohol. Limiting red meat. Using nonfat or low-fat dairy products. Consider following a plant-based diet. This includes dietary choices that focus on eating mostly vegetables and fruit, grains, beans, nuts, and seeds. If you have high blood pressure, you may need to limit your sodium intake or follow a diet such as the DASH (Dietary Approaches to Stop Hypertension) eating  plan. The DASH diet aims to lower high blood pressure. Lifestyle Set weight loss goals with help from your health care team. It is recommended that most people with prediabetes lose 7% of their body weight. Exercise for at least 30 minutes 5 or more days a week. Attend a support group or seek support from a mental health counselor. Take over-the-counter and prescription medicines only as told by your health care provider. What foods are recommended? Fruits Berries. Bananas. Apples. Oranges. Grapes. Papaya. Mango. Pomegranate. Kiwi. Grapefruit. Cherries. Vegetables Lettuce. Spinach. Peas. Beets. Cauliflower. Cabbage. Broccoli. Carrots. Tomatoes. Squash. Eggplant. Herbs. Peppers. Onions. Cucumbers. Brussels sprouts. Grains Whole grains, such as whole-wheat or whole-grain breads, crackers, cereals, and pasta. Unsweetened oatmeal. Bulgur. Barley. Quinoa. Brown rice. Corn or whole-wheat flour tortillas or taco shells. Meats and other proteins Seafood. Poultry without skin. Lean cuts of pork and beef. Tofu. Eggs. Nuts. Beans. Dairy Low-fat or fat-free dairy products, such as yogurt, cottage cheese, and cheese. Beverages Water. Tea. Coffee. Sugar-free or diet soda. Seltzer water. Low-fat or nonfat milk. Milk alternatives, such as soy or almond milk. Fats and oils Olive oil. Canola oil. Sunflower oil. Grapeseed oil. Avocado. Walnuts. Sweets and desserts Sugar-free or low-fat pudding. Sugar-free or low-fat ice cream and other frozen treats. Seasonings and condiments Herbs. Sodium-free spices. Mustard. Relish. Low-salt, low-sugar ketchup. Low-salt, low-sugar barbecue sauce. Low-fat or fat-free mayonnaise. The items listed above may not be a complete list of recommended foods and beverages. Contact a dietitian for more information. What foods are not recommended? Fruits Fruits canned with syrup. Vegetables Canned vegetables. Frozen vegetables with butter or cream sauce. Grains Refined white  flour and flour   products, such as bread, pasta, snack foods, and cereals. Meats and other proteins Fatty cuts of meat. Poultry with skin. Breaded or fried meat. Processed meats. Dairy Full-fat yogurt, cheese, or milk. Beverages Sweetened drinks, such as iced tea and soda. Fats and oils Butter. Lard. Ghee. Sweets and desserts Baked goods, such as cake, cupcakes, pastries, cookies, and cheesecake. Seasonings and condiments Spice mixes with added salt. Ketchup. Barbecue sauce. Mayonnaise. The items listed above may not be a complete list of foods and beverages that are not recommended. Contact a dietitian for more information. Where to find more information American Diabetes Association: www.diabetes.org Summary You may need to make diet and lifestyle changes to help prevent the onset of diabetes. These changes can help you control blood sugar, improve cholesterol levels, and manage blood pressure. Set weight loss goals with help from your health care team. It is recommended that most people with prediabetes lose 7% of their body weight. Consider following a Mediterranean diet. This includes eating plenty of fresh fruits and vegetables, whole grains, beans, nuts, seeds, fish, and low-fat dairy, and using olive oil instead of other fats. This information is not intended to replace advice given to you by your health care provider. Make sure you discuss any questions you have with your health care provider. Document Revised: 03/17/2020 Document Reviewed: 03/17/2020 Elsevier Patient Education  2024 [employer] Inc.  

## 2023-12-23 NOTE — Assessment & Plan Note (Signed)
Controlled on lisinopril HCT Reinforced DASH diet and exercise for weight loss C-Met today 

## 2023-12-23 NOTE — Assessment & Plan Note (Signed)
C-Met and lipid profile today She is adamant against trying cholesterol-lowering medication due to side effects and risk for dementia Discussed risk of heart attack, stroke and possible early death-she is aware of these risks Encourage low-fat diet

## 2023-12-23 NOTE — Assessment & Plan Note (Signed)
Encourage diet and exercise for weight loss 

## 2023-12-23 NOTE — Assessment & Plan Note (Signed)
Continue albuterol as needed 

## 2023-12-23 NOTE — Progress Notes (Deleted)
Subjective:    Patient ID: Patricia Garner, female    DOB: [birth year], 67 y.o.   MRN: [id number]  HPI  Patient presents to clinic today for follow-up of chronic conditions.  GERD with barrett's esophagus: Triggered by spicy foods and eating late at night.  She takes pantoprazole only as needed.  Upper GI from 09/2023 reviewed.  HTN: Her BP today is 126/78.  She is taking lisinopril hct as prescribed.  ECG from 03/2023 reviewed.  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  She takes tylenol arthritis as needed with some relief of symptoms.  She follows with orthopedics.  Osteoporosis: She is taking calcium and vitamin d OTC.  She has not been getting weightbearing exercise daily.  Bone density from 03/2023 reviewed.  Asthma: She denies chronic cough or shortness of breath.  She uses albuterol as needed with good relief of symptoms.  There are no PFTs on file.  Iron deficiency anemia: Her last H/H was 11.7/36.3, 05/2023.  She takes oral Iron only as needed.  She does not follow with hematology.  HLD: Her last LDL was 166, triglycerides 213, 05/2023.  She is not taking any cholesterol-lowering medication at this time.  She does not consume a low-fat diet.  Prediabetes: Her last A1c was 6.2%, 05/2023.  She is not taking any oral diabetic medication at this time.  She does not check her sugars.  Review of Systems  Past Medical History:  Diagnosis Date   Allergy    Arthritis    Asthma    Barrett esophagus    Chicken pox    Chronic vaginitis    Dysplastic nevus 08/30/2016   upper back spinal, mild   GERD (gastroesophageal reflux disease)    History of colon polyps    Hyperlipidemia    Hypertension    Osteoporosis    Pre-diabetes    Psoriasis (a type of skin inflammation)    to the scalp   Vitamin D deficiency    Vulvar intraepithelial neoplasia (VIN) grade 1     Current Outpatient Medications  Medication Sig Dispense Refill   albuterol (VENTOLIN HFA) 108 (90 Base) MCG/ACT inhaler INHALE 1 TO 2  PUFFS BY MOUTH EVERY 6 HOURS AS NEEDED FOR WHEEZING OR SHORTNESS OF BREATH 8 g 5   albuterol (VENTOLIN HFA) 108 (90 Base) MCG/ACT inhaler Inhale 2 puffs into the lungs every 6 (six) hours as needed for wheezing or shortness of breath.     Apple Cider Vinegar 600 MG CAPS Take by mouth.     APPLE CIDER VINEGAR PO Take by mouth.     Ascorbic Acid (VITAMIN C) 1000 MG tablet Take 1,000 mg by mouth daily.     Biotin 78469 MCG TABS Take by mouth.     clobetasol (TEMOVATE) 0.05 % external solution Apply 1 Application topically 2 (two) times daily. Apply to aa's psoriasis in the scalp QD up to 5d/wk. Avoid applying to face, groin, and axilla. Use as directed. Long-term use can cause thinning of the skin. 50 mL 1   clobetasol ointment (TEMOVATE) 0.05 % Apply to affected area every night for 4 weeks, then every other day for 4 weeks and then twice a week for 4 weeks or until resolution. 45 g 0   cyanocobalamin (VITAMIN B12) 1000 MCG tablet Take 1,000 mcg by mouth daily.     ferrous sulfate 325 (65 FE) MG EC tablet Take 325 mg by mouth as needed.     ibuprofen (ADVIL) 100 MG/5ML suspension Take  400 mg by mouth every 4 (four) hours as needed.     lisinopril-hydrochlorothiazide (ZESTORETIC) 20-25 MG tablet TAKE 1 TABLET BY MOUTH ONCE DAILY (OFFICE  VISIT  REQUIRED  FOR  FURTHER  REFILLS) 15 tablet 0   [place]a 500 MG CAPS Take by mouth.     Multiple Vitamin (MULTIVITAMIN) tablet Take 1 tablet by mouth daily.     omega-3 fish oil (MAXEPA) 1000 MG CAPS capsule Take by mouth.     pantoprazole (PROTONIX) 20 MG tablet Take 20 mg by mouth daily.     pantoprazole (PROTONIX) 40 MG tablet Take 1 tablet (40 mg total) by mouth daily. OFFICE VISIT NEEDED FOR ADDITIONAL REFILLS 15 tablet 0   triamcinolone (NASACORT) 55 MCG/ACT AERO nasal inhaler Place 2 sprays into the nose daily. 1 each 0   VITAMIN D, CHOLECALCIFEROL, PO Take 1 capsule by mouth daily at 6 (six) AM.     vitamin E 180 MG (400 UNITS) capsule Take 1,000 Units  by mouth daily.     No current facility-administered medications for this visit.    Allergies  Allergen Reactions   Bactrim [Sulfamethoxazole-Trimethoprim] Nausea Only   Codeine Nausea And Vomiting   Hydrocodone Hives   Lipitor [Atorvastatin] Other (See Comments)    Myalgia    Medrol [Methylprednisolone] Nausea Only   Sulfa Antibiotics Nausea Only    Family History  Problem Relation Age of Onset   Arthritis Mother    Heart disease Mother    Stroke Mother    Hypertension Mother    Alcohol abuse Father    Alcohol abuse Sister    Mental illness Sister    Stomach cancer Sister 50   Alcohol abuse Brother    Drug abuse Brother    Arthritis Maternal Grandmother    Stroke Maternal Grandmother    Colon cancer Maternal Grandfather 61   Breast cancer Neg Hx     Social History   Socioeconomic History   Marital status: Married    Spouse name: Not on file   Number of children: Not on file   Years of education: Not on file   Highest education level: Not on file  Occupational History   Not on file  Tobacco Use   Smoking status: Never   Smokeless tobacco: Never  Vaping Use   Vaping status: Never Used  Substance and Sexual Activity   Alcohol use: No    Alcohol/week: 0.0 standard drinks of alcohol   Drug use: Never   Sexual activity: Yes    Birth control/protection: Post-menopausal  Other Topics Concern   Not on file  Social History Narrative   Married and  Lives at home  with husband .   Social Drivers of Corporate investment banker Strain: Not on file  Food Insecurity: Not on file  Transportation Needs: Not on file  Physical Activity: Not on file  Stress: Not on file  Social Connections: Not on file  Intimate Partner Violence: Not on file     Constitutional: Denies fever, malaise, fatigue, headache or abrupt weight changes.  HEENT: Denies eye pain, eye redness, ear pain, ringing in the ears, wax buildup, runny nose, nasal congestion, bloody nose, or sore  throat. Respiratory: Denies difficulty breathing, shortness of breath, cough or sputum production.   Cardiovascular: Denies chest pain, chest tightness, palpitations or swelling in the hands or feet.  Gastrointestinal: Patient reports intermittent reflux.  Denies abdominal pain, bloating, constipation, diarrhea or blood in the stool.  GU: Denies urgency, frequency,  pain with urination, burning sensation, blood in urine, odor or discharge. Musculoskeletal: Patient reports knee pain.  Denies decrease in range of motion, difficulty with gait, muscle pain or joint swelling.  Skin: Patient reports discoloration of right foot, painful lumps of abdomen.  Denies redness, rashes, lesions or ulcercations.  Neurological: Denies dizziness, difficulty with memory, difficulty with speech or problems with balance and coordination.  Psych: Denies anxiety, depression, SI/HI.  No other specific complaints in a complete review of systems (except as listed in HPI above).     Objective:   Physical Exam   There were no vitals taken for this visit.  Wt Readings from Last 3 Encounters:  09/16/23 257 lb 12.8 oz (116.9 kg)  06/20/23 261 lb (118.4 kg)  06/10/23 263 lb (119.3 kg)    General: Appears her stated age, obese, in NAD. Skin: Warm, dry and intact.  PVD changes noted of right foot without ulceration.  Multiple palpable lumps noted of subcutaneous tissue of abdomen but hard to differentiate if this is subcutaneous fat versus an actual mass. HEENT: Head: normal shape and size; Eyes: sclera white, no icterus, conjunctiva pink, PERRLA and EOMs intact;  Neck:  Neck supple, trachea midline. No masses, lumps or thyromegaly present.  Cardiovascular: Normal rate and rhythm. S1,S2 noted.  No murmur, rubs or gallops noted. No JVD or BLE edema.  Pulmonary/Chest: Normal effort and positive vesicular breath sounds. No respiratory distress. No wheezes, rales or ronchi noted.  Abdomen: Soft and nontender. Normal bowel  sounds.  Musculoskeletal: No difficulty with gait.  Neurological: Alert and oriented. Cranial nerves II-XII grossly intact. Coordination normal.  Psychiatric: Mood and affect normal. Behavior is normal. Judgment and thought content normal.    BMET    Component Value Date/Time   NA 138 05/07/2023 0913   K 4.2 05/07/2023 0913   CL 101 05/07/2023 0913   CO2 28 05/07/2023 0913   GLUCOSE 109 05/07/2023 0913   BUN 22 05/07/2023 0913   CREATININE 0.69 05/07/2023 0913   CALCIUM 10.0 05/07/2023 0913    Lipid Panel     Component Value Date/Time   CHOL 248 (H) 05/07/2023 0913   TRIG 213 (H) 05/07/2023 0913   HDL 44 (L) 05/07/2023 0913   CHOLHDL 5.6 (H) 05/07/2023 0913   VLDL 30.6 09/01/2020 1440   LDLCALC 166 (H) 05/07/2023 0913    CBC    Component Value Date/Time   WBC 7.9 05/07/2023 0913   RBC 3.93 05/07/2023 0913   HGB 11.7 05/07/2023 0913   HCT 36.3 05/07/2023 0913   PLT 284 05/07/2023 0913   MCV 92.4 05/07/2023 0913   MCH 29.8 05/07/2023 0913   MCHC 32.2 05/07/2023 0913   RDW 12.2 05/07/2023 0913    Hgb A1C Lab Results  Component Value Date   HGBA1C 6.2 (H) 05/07/2023          Assessment & Plan:    RTC in 5 months for annual exam Nicki Reaper, NP

## 2023-12-23 NOTE — Assessment & Plan Note (Signed)
CBC today Continue oral iron as needed

## 2023-12-23 NOTE — Assessment & Plan Note (Signed)
Encourage weight loss as this can help reduce PVD changes Discussed the importance of cholesterol control, lipid profile today

## 2023-12-23 NOTE — Assessment & Plan Note (Signed)
A1c today Encourage low-carb diet and exercise for weight loss

## 2023-12-24 LAB — COMPLETE METABOLIC PANEL WITH GFR
AG Ratio: 1.8 (calc) (ref 1.0–2.5)
ALT: 22 U/L (ref 6–29)
AST: 16 U/L (ref 10–35)
Albumin: 4.5 g/dL (ref 3.6–5.1)
Alkaline phosphatase (APISO): 79 U/L (ref 37–153)
BUN: 21 mg/dL (ref 7–25)
CO2: 25 mmol/L (ref 20–32)
Calcium: 10 mg/dL (ref 8.6–10.4)
Chloride: 101 mmol/L (ref 98–110)
Creat: 0.76 mg/dL (ref 0.50–1.05)
Globulin: 2.5 g/dL (ref 1.9–3.7)
Glucose, Bld: 118 mg/dL (ref 65–139)
Potassium: 4 mmol/L (ref 3.5–5.3)
Sodium: 140 mmol/L (ref 135–146)
Total Bilirubin: 0.4 mg/dL (ref 0.2–1.2)
Total Protein: 7 g/dL (ref 6.1–8.1)
eGFR: 86 mL/min/{1.73_m2} (ref >=60)

## 2023-12-24 LAB — LIPID PANEL
Cholesterol: 253 mg/dL — ABNORMAL HIGH (ref <200)
HDL: 45 mg/dL — ABNORMAL LOW (ref >=50)
LDL Cholesterol (Calc): 156 mg/dL — ABNORMAL HIGH
Non-HDL Cholesterol (Calc): 208 mg/dL — ABNORMAL HIGH (ref <130)
Total CHOL/HDL Ratio: 5.6 (calc) — ABNORMAL HIGH (ref <5.0)
Triglycerides: 335 mg/dL — ABNORMAL HIGH (ref <150)

## 2023-12-24 LAB — HEMOGLOBIN A1C
Hgb A1c MFr Bld: 6.2 %{Hb} — ABNORMAL HIGH (ref <5.7)
Mean Plasma Glucose: 131 mg/dL
eAG (mmol/L): 7.3 mmol/L

## 2023-12-24 LAB — CBC
HCT: 36.9 % (ref 35.0–45.0)
Hemoglobin: 12 g/dL (ref 11.7–15.5)
MCH: 30.1 pg (ref 27.0–33.0)
MCHC: 32.5 g/dL (ref 32.0–36.0)
MCV: 92.5 fL (ref 80.0–100.0)
MPV: 11 fL (ref 7.5–12.5)
Platelets: 343 10*3/uL (ref 140–400)
RBC: 3.99 10*6/uL (ref 3.80–5.10)
RDW: 12.4 % (ref 11.0–15.0)
WBC: 8.7 10*3/uL (ref 3.8–10.8)

## 2024-01-02 ENCOUNTER — Telehealth: Payer: Self-pay | Admitting: Internal Medicine

## 2024-01-02 NOTE — Telephone Encounter (Signed)
 Called LVM 01/02/2024 to schedule AWV. Please schedule office or virtual visits.  Patricia Garner; Care Guide Ambulatory Clinical Support [place] [facility] Direct Dial: [phone]

## 2024-01-05 ENCOUNTER — Other Ambulatory Visit: Payer: Self-pay | Admitting: Internal Medicine

## 2024-01-05 DIAGNOSIS — J4520 Mild intermittent asthma, uncomplicated: Secondary | ICD-10-CM

## 2024-01-07 NOTE — Telephone Encounter (Signed)
 Requested Prescriptions  Pending Prescriptions Disp Refills   albuterol  (VENTOLIN  HFA) 108 (90 Base) MCG/ACT inhaler [Pharmacy Med Name: Albuterol  Sulfate HFA 108 (90 Base) MCG/ACT Inhalation Aerosol Solution] 9 g 2    Sig: INHALE 1 TO 2 PUFFS BY MOUTH EVERY 6 HOURS AS NEEDED FOR WHEEZING FOR SHORTNESS OF BREATH     Pulmonology:  Beta Agonists 2 Passed - 01/07/2024  4:32 PM      Passed - Last BP in normal range    BP Readings from Last 1 Encounters:  12/23/23 130/72         Passed - Last Heart Rate in normal range    Pulse Readings from Last 1 Encounters:  09/16/23 75         Passed - Valid encounter within last 12 months    Recent Outpatient Visits           2 weeks ago Essential (primary) hypertension   [place] [facility] [place], Angeline ORN, NP   6 months ago Viral sinusitis   [place] [facility] [place], Angeline ORN, NP   8 months ago Encounter for general adult medical examination with abnormal findings   [place] [facility] [place], Angeline ORN, NP   1 year ago Prediabetes   [place] [facility] [place], Angeline ORN, NP   2 years ago Prediabetes   [facility] [place], Angeline ORN, NP       Future Appointments             In 2 months Hester Alm BROCKS, MD [facility]   In 4 months [place], Angeline ORN, NP [facility], [facility]

## 2024-01-16 ENCOUNTER — Encounter: Payer: Self-pay | Admitting: Internal Medicine

## 2024-02-11 ENCOUNTER — Ambulatory Visit: Payer: PPO | Admitting: Obstetrics and Gynecology

## 2024-02-28 ENCOUNTER — Ambulatory Visit: Payer: PPO

## 2024-02-28 DIAGNOSIS — Z0000 Encounter for general adult medical examination without abnormal findings: Secondary | ICD-10-CM | POA: Diagnosis not present

## 2024-02-28 NOTE — Patient Instructions (Addendum)
 Patricia Garner , Thank you for taking time to come for your Medicare Wellness Visit. I appreciate your ongoing commitment to your health goals. Please review the following plan we discussed and let me know if I can assist you in the future.   Referrals/Orders/Follow-Ups/Clinician Recommendations: NONE  This is a list of the screening recommended for you and due dates:  Health Maintenance  Topic Date Due   Pneumonia Vaccine (1 of 2 - PCV) Never done   DTaP/Tdap/Td vaccine (1 - Tdap) Never done   Zoster (Shingles) Vaccine (1 of 2) Never done   Medicare Annual Wellness Visit  02/27/2025   Mammogram  03/19/2025   DEXA scan (bone density measurement)  03/19/2028   Colon Cancer Screening  09/15/2028   Hepatitis C Screening  Addressed   HPV Vaccine  Aged Out   COVID-19 Vaccine  Discontinued    Advanced directives: (ACP Link)Information on Advanced Care Planning can be found at [facility] Advance Health Care Directives Advance Health Care Directives ([email or web address]   Next Medicare Annual Wellness Visit scheduled for next year: Yes   03/05/25 @ 1:20 PM BY PHONE

## 2024-02-28 NOTE — Progress Notes (Signed)
 Subjective:   Patricia Garner is a 68 y.o. who presents for a Medicare Wellness preventive visit.  Visit Complete: Virtual I connected with  Lorayne Bender on 02/28/24 by a audio enabled telemedicine application and verified that I am speaking with the correct person using two identifiers.  Patient Location: Home  Provider Location: Office/Clinic  I discussed the limitations of evaluation and management by telemedicine. The patient expressed understanding and agreed to proceed.  Vital Signs: Because this visit was a virtual/telehealth visit, some criteria may be missing or patient reported. Any vitals not documented were not able to be obtained and vitals that have been documented are patient reported.  VideoDeclined- This patient declined Librarian, academic. Therefore the visit was completed with audio only.  AWV Questionnaire: No: Patient Medicare AWV questionnaire was not completed prior to this visit.  Cardiac Risk Factors include: advanced age (>9men, >67 women);dyslipidemia;hypertension;obesity (BMI >30kg/m2)     Objective:    Today's Vitals   02/28/24 1400  PainSc: 4    There is no height or weight on file to calculate BMI.     02/28/2024    2:07 PM 09/16/2023    6:56 AM 05/22/2023   10:38 AM 04/19/2023    9:44 AM 04/12/2023   10:53 AM 03/18/2023   11:43 AM 02/20/2023    1:07 PM  Advanced Directives  Does Patient Have a Medical Advance Directive? No No No No No No No  Would patient like information on creating a medical advance directive? No - Patient declined  No - Patient declined No - Patient declined  No - Patient declined No - Patient declined    Current Medications (verified) Outpatient Encounter Medications as of 02/28/2024  Medication Sig   albuterol (VENTOLIN HFA) 108 (90 Base) MCG/ACT inhaler Inhale 2 puffs into the lungs every 6 (six) hours as needed for wheezing or shortness of breath.   albuterol (VENTOLIN HFA) 108 (90 Base)  MCG/ACT inhaler INHALE 1 TO 2 PUFFS BY MOUTH EVERY 6 HOURS AS NEEDED FOR WHEEZING FOR SHORTNESS OF BREATH   Ascorbic Acid (VITAMIN C) 1000 MG tablet Take 1,000 mg by mouth daily.   Biotin 16109 MCG TABS Take by mouth.   clobetasol (TEMOVATE) 0.05 % external solution Apply 1 Application topically 2 (two) times daily. Apply to aa's psoriasis in the scalp QD up to 5d/wk. Avoid applying to face, groin, and axilla. Use as directed. Long-term use can cause thinning of the skin.   cyanocobalamin (VITAMIN B12) 1000 MCG tablet Take 1,000 mcg by mouth daily.   ferrous sulfate 325 (65 FE) MG EC tablet Take 325 mg by mouth as needed.   ibuprofen (ADVIL) 100 MG/5ML suspension Take 400 mg by mouth every 4 (four) hours as needed.   lisinopril-hydrochlorothiazide (ZESTORETIC) 20-25 MG tablet TAKE 1 TABLET BY MOUTH ONCE DAILY (OFFICE  VISIT  REQUIRED  FOR  FURTHER  REFILLS)   omega-3 fish oil (MAXEPA) 1000 MG CAPS capsule Take by mouth.   pantoprazole (PROTONIX) 40 MG tablet Take 1 tablet (40 mg total) by mouth daily. OFFICE VISIT NEEDED FOR ADDITIONAL REFILLS   triamcinolone (NASACORT) 55 MCG/ACT AERO nasal inhaler Place 2 sprays into the nose daily.   TURMERIC PO Take 1,000 mg by mouth.   VITAMIN D, CHOLECALCIFEROL, PO Take 1 capsule by mouth daily at 6 (six) AM.   Apple Cider Vinegar 600 MG CAPS Take by mouth. (Patient not taking: Reported on 02/28/2024)   Moringa 500 MG CAPS Take  by mouth. (Patient not taking: Reported on 02/28/2024)   Multiple Vitamin (MULTIVITAMIN) tablet Take 1 tablet by mouth daily. (Patient not taking: Reported on 02/28/2024)   vitamin E 180 MG (400 UNITS) capsule Take 1,000 Units by mouth daily. (Patient not taking: Reported on 02/28/2024)   No facility-administered encounter medications on file as of 02/28/2024.    Allergies (verified) Bactrim [sulfamethoxazole-trimethoprim], Codeine, Hydrocodone, Lipitor [atorvastatin], Medrol [methylprednisolone], and Sulfa antibiotics    History: Past Medical History:  Diagnosis Date   Allergy    Arthritis    Asthma    Barrett esophagus    Chicken pox    Chronic vaginitis    Dysplastic nevus 08/30/2016   upper back spinal, mild   GERD (gastroesophageal reflux disease)    History of colon polyps    Hyperlipidemia    Hypertension    Osteoporosis    Pre-diabetes    Psoriasis (a type of skin inflammation)    to the scalp   Vitamin D deficiency    Vulvar intraepithelial neoplasia (VIN) grade 1    Past Surgical History:  Procedure Laterality Date   CERVICAL POLYPECTOMY N/A 03/27/2023   Procedure: CERVICAL POLYPECTOMY/ BIOPSY;  Surgeon: Artelia Laroche, MD;  Location: [facility] ORS;  Service: Gynecology;  Laterality: N/A;   CHOLECYSTECTOMY  1983   COLONOSCOPY WITH PROPOFOL N/A 08/28/2016   Procedure: COLONOSCOPY WITH PROPOFOL;  Surgeon: Christena Deem, MD;  Location: [facility] ENDOSCOPY;  Service: Endoscopy;  Laterality: N/A;   COLONOSCOPY WITH PROPOFOL N/A 09/16/2023   Procedure: COLONOSCOPY WITH PROPOFOL;  Surgeon: [name], MD;  Location: [facility] ENDOSCOPY;  Service: Endoscopy;  Laterality: N/A;   COLPOSCOPY N/A 03/27/2023   Procedure: COLPOSCOPY;  Surgeon: Artelia Laroche, MD;  Location: [facility];  Service: Gynecology;  Laterality: N/A;   ESOPHAGOGASTRODUODENOSCOPY (EGD) WITH PROPOFOL N/A 08/28/2016   Procedure: ESOPHAGOGASTRODUODENOSCOPY (EGD) WITH PROPOFOL;  Surgeon: Christena Deem, MD;  Location: [facility] ENDOSCOPY;  Service: Endoscopy;  Laterality: N/A;   ESOPHAGOGASTRODUODENOSCOPY (EGD) WITH PROPOFOL N/A 09/15/2019   Procedure: ESOPHAGOGASTRODUODENOSCOPY (EGD) WITH PROPOFOL;  Surgeon: Christena Deem, MD;  Location[facility]COPY;  Service: Endoscopy;  Laterality: N/A;   ESOPHAGOGASTRODUODENOSCOPY (EGD) WITH PROPOFOL N/A 09/16/2023   Procedure: ESOPHAGOGASTRODUODENOSCOPY (EGD) WITH PROPOFOL;  Surgeon: [name], MD;  Location: [facility] ENDOSCOPY;  Service: Endoscopy;  Laterality: N/A;    HEMOSTASIS CLIP PLACEMENT  09/16/2023   Procedure: HEMOSTASIS CLIP PLACEMENT;  Surgeon: [name], MD;  Location: [facility] ENDOSCOPY;  Service: Endoscopy;;   POLYPECTOMY  09/16/2023   Procedure: POLYPECTOMY;  Surgeon: [name], MD;  Location: [facility] ENDOSCOPY;  Service: Endoscopy;;   TONSILLECTOMY  1969   VULVA Ples Specter BIOPSY Bilateral 03/27/2023   Procedure: VULVAR BIOPSY - MULTIPLE;  Surgeon: Artelia Laroche, MD;  Location: [facility] ORS;  Service: Gynecology;  Laterality: Bilateral;   VULVAR LESION REMOVAL  03/27/2023   Procedure: ABLATION VULVAR INTRAEPITHELIAL LESION;  Surgeon: Artelia Laroche, MD;  Location: [facility] OR[facility];  Service: Gynecology;;   Family History  Problem Relation Age of Onset   Arthritis Mother    Heart disease Mother    Stroke Mother    Hypertension Mother    Alcohol abuse Father    Alcohol abuse Sister    Mental illness Sister    Stomach cancer Sister 41   Alcohol abuse Brother    Drug abuse Brother    Arthritis Maternal Grandmother    Stroke Maternal Grandmother    Colon cancer Maternal Grandfather 17   Breast cancer  Neg Hx    Social History   Socioeconomic History   Marital status: Married    Spouse name: Not on file   Number of children: Not on file   Years of education: Not on file   Highest education level: Not on file  Occupational History   Not on file  Tobacco Use   Smoking status: Never   Smokeless tobacco: Never  Vaping Use   Vaping status: Never Used  Substance and Sexual Activity   Alcohol use: No    Alcohol/week: 0.0 standard drinks of alcohol   Drug use: Never   Sexual activity: Yes    Birth control/protection: Post-menopausal  Other Topics Concern   Not on file  Social History Narrative   Married and  Lives at home  with husband .   Social Drivers of Corporate investment banker Strain: Low Risk  (02/28/2024)   Overall Financial Resource Strain (CARDIA)    Difficulty of Paying Living Expenses: Not very  hard  Food Insecurity: No Food Insecurity (02/28/2024)   Hunger Vital Sign    Worried About Running Out of Food in the Last Year: Never true    Ran Out of Food in the Last Year: Never true  Transportation Needs: No Transportation Needs (02/28/2024)   PRAPARE - Administrator, Civil Service (Medical): No    Lack of Transportation (Non-Medical): No  Physical Activity: Inactive (02/28/2024)   Exercise Vital Sign    Days of Exercise per Week: 0 days    Minutes of Exercise per Session: 0 min  Stress: No Stress Concern Present (02/28/2024)   [employer] of Occupational Health - Occupational Stress Questionnaire    Feeling of Stress : Not at all  Social Connections: Moderately Integrated (02/28/2024)   Social Connection and Isolation Panel [NHANES]    Frequency of Communication with Friends and Family: More than three times a week    Frequency of Social Gatherings with Friends and Family: More than three times a week    Attends Religious Services: More than 4 times per year    Active Member of [employer] or Organizations: No    Attends Engineer, structural: Never    Marital Status: Married    Tobacco Counseling Counseling given: Not Answered    Clinical Intake:  Pre-visit preparation completed: Yes  Pain : 0-10 Pain Score: 4  Pain Type: Chronic pain Pain Location: Knee Pain Orientation: Left Pain Descriptors / Indicators: Aching, Throbbing Pain Onset: More than a month ago Pain Frequency: Constant Pain Relieving Factors: TYLENOL OR ADVIL  Pain Relieving Factors: TYLENOL OR ADVIL  BMI - recorded: 46.2 Nutritional Status: BMI > 30  Obese Nutritional Risks: None Diabetes: No  How often do you need to have someone help you when you read instructions, pamphlets, or other written materials from your doctor or pharmacy?: 1 - Never  Interpreter Needed?: No  Information entered by :: Kennedy Bucker, LPN   Activities of Daily Living     02/28/2024    2:08 PM  06/20/2023    9:06 AM  In your present state of health, do you have any difficulty performing the following activities:  Hearing? 0 0  Vision? 0 0  Difficulty concentrating or making decisions? 0 0  Walking or climbing stairs? 1 0  Comment KNEE PAIN   Dressing or bathing? 0 0  Doing errands, shopping? 0 0  Preparing Food and eating ? N   Using the Toilet? N   In  the past six months, have you accidently leaked urine? N   Do you have problems with loss of bowel control? N   Managing your Medications? N   Managing your Finances? N   Housekeeping or managing your Housekeeping? N     Patient Care Team: Lorre Munroe, NP as PCP - General (Internal Medicine) Benita Gutter, RN as Oncology Nurse Navigator Pa, [facility]  Indicate any recent Medical Services you may have received from other than [facility] providers in the past year (date may be approximate).     Assessment:   This is a routine wellness examination for Raneem.  Hearing/Vision screen Hearing Screening - Comments:: NO AIDS Vision Screening - Comments:: WEARS GLASSES ALL THE TIME- PATTY VISION   Goals Addressed             This Visit's Progress    DIET - EAT MORE FRUITS AND VEGETABLES         Depression Screen     02/28/2024    2:05 PM 12/23/2023    2:04 PM 06/20/2023    9:06 AM 05/07/2023    9:13 AM 11/09/2021    9:46 AM 09/01/2020    2:26 PM 04/23/2019    8:18 AM  PHQ 2/9 Scores  PHQ - 2 Score 0 0 0 0 0 0 0  PHQ- 9 Score 0    0      Fall Risk     02/28/2024    2:08 PM 12/23/2023    2:04 PM 06/20/2023    9:06 AM 05/07/2023    9:13 AM 11/09/2021    9:46 AM  Fall Risk   Falls in the past year? 0 0 0 1 0  Number falls in past yr: 0   0 0  Injury with Fall? 0  0 0 0  Risk for fall due to : No Fall Risks  No Fall Risks Orthopedic patient No Fall Risks  Follow up Falls prevention discussed;Falls evaluation completed    Falls evaluation completed    MEDICARE RISK AT HOME:  Medicare Risk at  Home Any stairs in or around the home?: Yes If so, are there any without handrails?: No Home free of loose throw rugs in walkways, pet beds, electrical cords, etc?: Yes Adequate lighting in your home to reduce risk of falls?: Yes Life alert?: No Use of a cane, walker or w/c?: No Grab bars in the bathroom?: No Shower chair or bench in shower?: Yes Elevated toilet seat or a handicapped toilet?: Yes  TIMED UP AND GO:  Was the test performed?  No  Cognitive Function: 6CIT completed        02/28/2024    2:10 PM  6CIT Screen  What Year? 0 points  What month? 0 points  What time? 0 points  Count back from 20 0 points  Months in reverse 0 points  Repeat phrase 0 points  Total Score 0 points    Immunizations  There is no immunization history on file for this patient.  Screening Tests Health Maintenance  Topic Date Due   Pneumonia Vaccine 72+ Years old (1 of 2 - PCV) Never done   DTaP/Tdap/Td (1 - Tdap) Never done   Zoster Vaccines- Shingrix (1 of 2) Never done   Medicare Annual Wellness (AWV)  02/27/2025   MAMMOGRAM  03/19/2025   DEXA SCAN  03/19/2028   Colonoscopy  09/15/2028   Hepatitis C Screening  Addressed   HPV VACCINES  Aged Out  COVID-19 Vaccine  Discontinued    Health Maintenance  Health Maintenance Due  Topic Date Due   Pneumonia Vaccine 40+ Years old (1 of 2 - PCV) Never done   DTaP/Tdap/Td (1 - Tdap) Never done   Zoster Vaccines- Shingrix (1 of 2) Never done   Health Maintenance Items Addressed: WANTS TO WAIT ON MAMMOGRAM, UP TO DATE ON COLONOSCOPY- DOESN'T GET VACCINES  Additional Screening:  Vision Screening: Recommended annual ophthalmology exams for early detection of glaucoma and other disorders of the eye.  Dental Screening: Recommended annual dental exams for proper oral hygiene  Community Resource Referral / Chronic Care Management: CRR required this visit?  No   CCM required this visit?  No     Plan:     I have personally  reviewed and noted the following in the patient's chart:   Medical and social history Use of alcohol, tobacco or illicit drugs  Current medications and supplements including opioid prescriptions. Patient is not currently taking opioid prescriptions. Functional ability and status Nutritional status Physical activity Advanced directives List of other physicians Hospitalizations, surgeries, and ER visits in previous 12 months Vitals Screenings to include cognitive, depression, and falls Referrals and appointments  In addition, I have reviewed and discussed with patient certain preventive protocols, quality metrics, and best practice recommendations. A written personalized care plan for preventive services as well as general preventive health recommendations were provided to patient.     Hal Hope, LPN   04/08/8118   After Visit Summary: (MyChart) Due to this being a telephonic visit, the after visit summary with patients personalized plan was offered to patient via MyChart   Notes: Nothing significant to report at this time.

## 2024-03-02 NOTE — Progress Notes (Unsigned)
 No chief complaint on file.   HPI:      Ms. Patricia Garner is a 68 y.o. [phone] who LMP was No LMP recorded. Patient is postmenopausal., presents today for her annual examination.  Her menses are absent due to menopause.  She does not have postmenopausal bleeding. She does not have vasomotor sx.   Sex activity: single partner, contraception - post menopausal status. She does have vaginal dryness. She uses lubricants with relief.   Last Pap: 07/02/19 Results were: no abnormalities /neg HPV DNA.   She has a hx of chronic vaginal irritation since 2018; treated by Dr. Arvil Chaco with lotrisone. Sx would improve temporarily but then recur. 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 Gyn onc biopsies neg except LS confirmed in 2 areas. 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 Sonia Side care.    Last mammogram: 03/20/23 with PCP;  Results were: normal--routine follow-up in 12 months There is no FH of breast cancer. There is no FH of ovarian cancer. The patient does not do self-breast exams.  Colonoscopy: colonoscopy 9/24 at [facility], repeat due after ??? Yrs; 2017--repeat after 5 yrs per pt report.  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 Alcohol use: none  No drug use Exercise: mod active  She does get adequate calcium and Vitamin D in her diet. Labs with PCP. Has hyperlipidemia and Vit D deficiency.    Past Medical History:  Diagnosis Date   Allergy    Arthritis    Asthma    Barrett esophagus    Chicken pox    Chronic vaginitis    Dysplastic nevus 08/30/2016   upper back spinal, mild   GERD (gastroesophageal reflux disease)    History of colon polyps    Hyperlipidemia    Hypertension    Osteoporosis    Pre-diabetes    Psoriasis (a type of skin inflammation)    to the scalp   Vitamin D deficiency    Vulvar intraepithelial neoplasia (VIN) grade 1     Past Surgical History:   Procedure Laterality Date   CERVICAL POLYPECTOMY N/A 03/27/2023   Procedure: CERVICAL POLYPECTOMY/ BIOPSY;  Surgeon: Artelia Laroche, MD;  Location: [facility] ORS;  Service: Gynecology;  Laterality: N/A;   CHOLECYSTECTOMY  1983   COLONOSCOPY WITH PROPOFOL N/A 08/28/2016   Procedure: COLONOSCOPY WITH PROPOFOL;  Surgeon: Christena Deem, MD;  Location: [facility] ENDOSCOPY;  Service: Endoscopy;  Laterality: N/A;   COLONOSCOPY WITH PROPOFOL N/A 09/16/2023   Procedure: COLONOSCOPY WITH PROPOFOL;  Surgeon: [name], MD;  Location: [facility] ENDOSCOPY;  Service: Endoscopy;  Laterality: N/A;   COLPOSCOPY N/A 03/27/2023   Procedure: COLPOSCOPY;  Surgeon: Artelia Laroche, MD;  Location: [facility];  Service: Gynecology;  Laterality: N/A;   ESOPHAGOGASTRODUODENOSCOPY (EGD) WITH PROPOFOL N/A 08/28/2016   Procedure: ESOPHAGOGASTRODUODENOSCOPY (EGD) WITH PROPOFOL;  Surgeon: Christena Deem, MD;  Location: [facility] ENDOSCOPY;  Service: Endoscopy;  Laterality: N/A;   ESOPHAGOGASTRODUODENOSCOPY (EGD) WITH PROPOFOL N/A 09/15/2019   Procedure: ESOPHAGOGASTRODUODENOSCOPY (EGD) WITH PROPOFOL;  Surgeon: Christena Deem, MD;  Location: [facility] ENDOSCOPY;  Service: Endoscopy;  Laterality: N/A;   ESOPHAGOGASTRODUODENOSCOPY (EGD) WITH PROPOFOL N/A 09/16/2023   Procedure: ESOPHAGOGASTRODUODENOSCOPY (EGD) WITH PROPOFOL;  Surgeon: [name], MD;  Location: [facility] ENDOSCOPY;  Service: Endoscopy;  Laterality: N/A;   HEMOSTASIS CLIP PLACEMENT  09/16/2023   Procedure: HEMOSTASIS CLIP PLACEMENT;  Surgeon: [name], MD;  Location: [facility] ENDOSCOPY;  Service: Endoscopy;;   POLYPECTOMY  09/16/2023   Procedure: POLYPECTOMY;  Surgeon: [name], MD;  Location: [facility] ENDOSCOPY;  Service: Endoscopy;;   TONSILLECTOMY  1969   VULVA Ples Specter BIOPSY Bilateral 03/27/2023   Procedure: VULVAR BIOPSY - MULTIPLE;  Surgeon: Artelia Laroche, MD;  Location: [facility] ORS;  Service: Gynecology;  Laterality:  Bilateral;   VULVAR LESION REMOVAL  03/27/2023   Procedure: ABLATION VULVAR INTRAEPITHELIAL LESION;  Surgeon: Artelia Laroche, MD;  Location: [facility] ORS;  Service: Gynecology;;    Family History  Problem Relation Age of Onset   Arthritis Mother    Heart disease Mother    Stroke Mother    Hypertension Mother    Alcohol abuse Father    Alcohol abuse Sister    Mental illness Sister    Stomach cancer Sister 97   Alcohol abuse Brother    Drug abuse Brother    Arthritis Maternal Grandmother    Stroke Maternal Grandmother    Colon cancer Maternal Grandfather 97   Breast cancer Neg Hx     Social History   Socioeconomic History   Marital status: Married    Spouse name: Not on file   Number of children: Not on file   Years of education: Not on file   Highest education level: Not on file  Occupational History   Not on file  Tobacco Use   Smoking status: Never   Smokeless tobacco: Never  Vaping Use   Vaping status: Never Used  Substance and Sexual Activity   Alcohol use: No    Alcohol/week: 0.0 standard drinks of alcohol   Drug use: Never   Sexual activity: Yes    Birth control/protection: Post-menopausal  Other Topics Concern   Not on file  Social History Narrative   Married and  Lives at home  with husband .   Social Drivers of Corporate investment banker Strain: Low Risk  (02/28/2024)   Overall Financial Resource Strain (CARDIA)    Difficulty of Paying Living Expenses: Not very hard  Food Insecurity: No Food Insecurity (02/28/2024)   Hunger Vital Sign    Worried About Running Out of Food in the Last Year: Never true    Ran Out of Food in the Last Year: Never true  Transportation Needs: No Transportation Needs (02/28/2024)   PRAPARE - Administrator, Civil Service (Medical): No    Lack of Transportation (Non-Medical): No  Physical Activity: Inactive (02/28/2024)   Exercise Vital Sign    Days of Exercise per Week: 0 days    Minutes of Exercise per  Session: 0 min  Stress: No Stress Concern Present (02/28/2024)   [employer] of Occupational Health - Occupational Stress Questionnaire    Feeling of Stress : Not at all  Social Connections: Moderately Integrated (02/28/2024)   Social Connection and Isolation Panel [NHANES]    Frequency of Communication with Friends and Family: More than three times a week    Frequency of Social Gatherings with Friends and Family: More than three times a week    Attends Religious Services: More than 4 times per year    Active Member of [employer] or Organizations: No    Attends Banker Meetings: Never    Marital Status: Married  Catering manager Violence: Not At Risk (02/28/2024)   Humiliation, Afraid, Rape, and Kick questionnaire    Fear of Current or Ex-Partner: No    Emotionally  Abused: No    Physically Abused: No    Sexually Abused: No     Current Outpatient Medications:    albuterol (VENTOLIN HFA) 108 (90 Base) MCG/ACT inhaler, Inhale 2 puffs into the lungs every 6 (six) hours as needed for wheezing or shortness of breath., Disp: , Rfl:    albuterol (VENTOLIN HFA) 108 (90 Base) MCG/ACT inhaler, INHALE 1 TO 2 PUFFS BY MOUTH EVERY 6 HOURS AS NEEDED FOR WHEEZING FOR SHORTNESS OF BREATH, Disp: 9 g, Rfl: 2   Apple Cider Vinegar 600 MG CAPS, Take by mouth. (Patient not taking: Reported on 02/28/2024), Disp: , Rfl:    Ascorbic Acid (VITAMIN C) 1000 MG tablet, Take 1,000 mg by mouth daily., Disp: , Rfl:    Biotin 16109 MCG TABS, Take by mouth., Disp: , Rfl:    clobetasol (TEMOVATE) 0.05 % external solution, Apply 1 Application topically 2 (two) times daily. Apply to aa's psoriasis in the scalp QD up to 5d/wk. Avoid applying to face, groin, and axilla. Use as directed. Long-term use can cause thinning of the skin., Disp: 50 mL, Rfl: 1   cyanocobalamin (VITAMIN B12) 1000 MCG tablet, Take 1,000 mcg by mouth daily., Disp: , Rfl:    ferrous sulfate 325 (65 FE) MG EC tablet, Take 325 mg by mouth as  needed., Disp: , Rfl:    ibuprofen (ADVIL) 100 MG/5ML suspension, Take 400 mg by mouth every 4 (four) hours as needed., Disp: , Rfl:    lisinopril-hydrochlorothiazide (ZESTORETIC) 20-25 MG tablet, TAKE 1 TABLET BY MOUTH ONCE DAILY (OFFICE  VISIT  REQUIRED  FOR  FURTHER  REFILLS), Disp: 90 tablet, Rfl: 1   Moringa 500 MG CAPS, Take by mouth. (Patient not taking: Reported on 02/28/2024), Disp: , Rfl:    Multiple Vitamin (MULTIVITAMIN) tablet, Take 1 tablet by mouth daily. (Patient not taking: Reported on 02/28/2024), Disp: , Rfl:    omega-3 fish oil (MAXEPA) 1000 MG CAPS capsule, Take by mouth., Disp: , Rfl:    pantoprazole (PROTONIX) 40 MG tablet, Take 1 tablet (40 mg total) by mouth daily. OFFICE VISIT NEEDED FOR ADDITIONAL REFILLS, Disp: 90 tablet, Rfl: 1   triamcinolone (NASACORT) 55 MCG/ACT AERO nasal inhaler, Place 2 sprays into the nose daily., Disp: 1 each, Rfl: 0   TURMERIC PO, Take 1,000 mg by mouth., Disp: , Rfl:    VITAMIN D, CHOLECALCIFEROL, PO, Take 1 capsule by mouth daily at 6 (six) AM., Disp: , Rfl:    vitamin E 180 MG (400 UNITS) capsule, Take 1,000 Units by mouth daily. (Patient not taking: Reported on 02/28/2024), Disp: , Rfl:    ROS:  Review of Systems  Constitutional:  Negative for fever, malaise/fatigue and weight loss.  HENT:  Negative for congestion, ear pain and sinus pain.   Respiratory:  Negative for cough, shortness of breath and wheezing.   Cardiovascular:  Negative for chest pain, orthopnea and leg swelling.  Gastrointestinal:  Negative for constipation, diarrhea, nausea and vomiting.  Genitourinary:  Negative for dysuria, frequency, hematuria and urgency.       Breast ROS: negative   Musculoskeletal:  Positive for joint pain. Negative for back pain and myalgias.  Skin:  Negative for itching and rash.  Neurological:  Negative for dizziness, tingling, focal weakness and headaches.  Endo/Heme/Allergies:  Negative for environmental allergies. Does not bruise/bleed  easily.  Psychiatric/Behavioral:  Negative for depression and suicidal ideas. The patient is not nervous/anxious and does not have insomnia.     Objective: There were no vitals  taken for this visit.   Physical Exam Constitutional:      Appearance: She is well-developed.  Genitourinary:     Vulva normal.     Genitourinary Comments: EXT EXAM WITH ERYTHEMA IN PATCHES BILAT LABIA MAJORA WITH A LITTLE SKIN BREAKDOWN     Right Labia: tenderness and lesions.     Right Labia: No rash.    Left Labia: tenderness and lesions.     Left Labia: No rash.    No vaginal discharge, erythema or tenderness.      Right Adnexa: not tender and no mass present.    Left Adnexa: not tender and no mass present.    No cervical friability or polyp.     Uterus is not enlarged or tender.  Breasts:    Right: No mass, nipple discharge, skin change or tenderness.     Left: No mass, nipple discharge, skin change or tenderness.  Neck:     Thyroid: No thyromegaly.  Cardiovascular:     Rate and Rhythm: Normal rate and regular rhythm.     Heart sounds: Normal heart sounds. No murmur heard. Pulmonary:     Effort: Pulmonary effort is normal.     Breath sounds: Normal breath sounds.  Abdominal:     Palpations: Abdomen is soft.     Tenderness: There is no abdominal tenderness. There is no guarding or rebound.  Musculoskeletal:        General: Normal range of motion.     Cervical back: Normal range of motion.  Lymphadenopathy:     Cervical: No cervical adenopathy.  Neurological:     General: No focal deficit present.     Mental Status: She is alert and oriented to person, place, and time.     Cranial Nerves: No cranial nerve deficit.  Skin:    General: Skin is warm and dry.  Psychiatric:        Mood and Affect: Mood normal.        Behavior: Behavior normal.        Thought Content: Thought content normal.        Judgment: Judgment normal.  Vitals reviewed.    VULVAR BIOPSY NOTE The indications for  vulvar biopsy (rule out neoplasia, establish lichen sclerosus diagnosis) were reviewed.   Risks of the biopsy including pain, bleeding, infection, inadequate specimen, scarring and need for additional procedures  were discussed. The patient stated understanding and agreed to undergo procedure today. Consent was signed,  time out performed.   The patient's vulva was prepped with Betadine. 1% lidocaine was injected into area of concern. A 3.5 -mm punch biopsy was done, biopsy tissue was picked up with sterile forceps and sterile scissors were used to excise the lesion.  Small bleeding was noted and hemostasis was achieved using silver nitrate sticks.  The patient tolerated the procedure well. Post-procedure instructions  (pelvic rest for one week) were given to the patient. The patient is to call with heavy bleeding, fever greater than 100.4, foul smelling vaginal discharge or other concerns.    Assessment/Plan:  Encounter for annual routine gynecological examination  Encounter for screening mammogram for malignant neoplasm of breast - Plan: MM 3D SCREEN BREAST BILATERAL; pt to schedule mammo  Vaginal irritation--areas of resolved fungal tx; may need Rx RF lotrison crm for those area. Keep dry/try A&D or desitin  Lichen sclerosus--Plan: Surgical pathology; area bx today. Will f/u with results and mgmt. Discussed clobetasol tx.          GYN  counsel mammography screening, adequate intake of calcium and vitamin D, diet and exercise     F/U  No follow-ups on file.  Oral Remache B. Tyreka Henneke, PA-C 03/02/2024 8:08 PM

## 2024-03-03 ENCOUNTER — Ambulatory Visit (INDEPENDENT_AMBULATORY_CARE_PROVIDER_SITE_OTHER): Payer: PPO | Admitting: Obstetrics and Gynecology

## 2024-03-03 ENCOUNTER — Encounter: Payer: Self-pay | Admitting: Obstetrics and Gynecology

## 2024-03-03 ENCOUNTER — Other Ambulatory Visit (HOSPITAL_COMMUNITY)
Admission: RE | Admit: 2024-03-03 | Discharge: 2024-03-03 | Disposition: A | Discharge status: Home / Self Care | Source: Ambulatory Visit | Attending: Obstetrics and Gynecology | Admitting: Obstetrics and Gynecology

## 2024-03-03 VITALS — BP 128/72 | Ht 63.0 in | Wt 260.0 lb

## 2024-03-03 DIAGNOSIS — Z01419 Encounter for gynecological examination (general) (routine) without abnormal findings: Secondary | ICD-10-CM | POA: Diagnosis not present

## 2024-03-03 DIAGNOSIS — N900 Mild vulvar dysplasia: Secondary | ICD-10-CM

## 2024-03-03 DIAGNOSIS — Z1231 Encounter for screening mammogram for malignant neoplasm of breast: Secondary | ICD-10-CM

## 2024-03-03 DIAGNOSIS — L409 Psoriasis, unspecified: Secondary | ICD-10-CM

## 2024-03-03 DIAGNOSIS — L900 Lichen sclerosus et atrophicus: Secondary | ICD-10-CM

## 2024-03-03 DIAGNOSIS — Z1151 Encounter for screening for human papillomavirus (HPV): Secondary | ICD-10-CM | POA: Diagnosis not present

## 2024-03-03 DIAGNOSIS — Z124 Encounter for screening for malignant neoplasm of cervix: Secondary | ICD-10-CM

## 2024-03-03 MED ORDER — CLOBETASOL PROPIONATE 0.05 % EX OINT
TOPICAL_OINTMENT | CUTANEOUS | 1 refills | Status: AC
Start: 2024-03-03 — End: ?

## 2024-03-03 NOTE — Patient Instructions (Signed)
 I value your feedback and you entrusting [place] with your care. If you get a [place] patient survey, I would appreciate you taking the time to let us know about your experience today. Thank you! ? ? ?

## 2024-03-06 LAB — CYTOLOGY - PAP
Comment: NEGATIVE
Diagnosis: NEGATIVE
High risk HPV: NEGATIVE

## 2024-03-17 DIAGNOSIS — H02831 Dermatochalasis of right upper eyelid: Secondary | ICD-10-CM | POA: Diagnosis not present

## 2024-03-17 DIAGNOSIS — H02403 Unspecified ptosis of bilateral eyelids: Secondary | ICD-10-CM | POA: Diagnosis not present

## 2024-03-17 DIAGNOSIS — L409 Psoriasis, unspecified: Secondary | ICD-10-CM | POA: Insufficient documentation

## 2024-03-17 DIAGNOSIS — H02834 Dermatochalasis of left upper eyelid: Secondary | ICD-10-CM | POA: Diagnosis not present

## 2024-03-17 DIAGNOSIS — H5340 Unspecified visual field defects: Secondary | ICD-10-CM | POA: Insufficient documentation

## 2024-03-25 ENCOUNTER — Ambulatory Visit: Payer: PPO | Admitting: Dermatology

## 2024-04-01 ENCOUNTER — Ambulatory Visit
Admission: RE | Admit: 2024-04-01 | Discharge: 2024-04-01 | Disposition: A | Discharge status: Home / Self Care | Source: Ambulatory Visit | Attending: Obstetrics and Gynecology | Admitting: Obstetrics and Gynecology

## 2024-04-01 DIAGNOSIS — Z1231 Encounter for screening mammogram for malignant neoplasm of breast: Secondary | ICD-10-CM | POA: Insufficient documentation

## 2024-04-05 ENCOUNTER — Encounter: Payer: Self-pay | Admitting: Obstetrics and Gynecology

## 2024-05-04 ENCOUNTER — Encounter: Payer: Self-pay | Admitting: Dermatology

## 2024-05-04 ENCOUNTER — Ambulatory Visit: Admitting: Dermatology

## 2024-05-04 DIAGNOSIS — L814 Other melanin hyperpigmentation: Secondary | ICD-10-CM | POA: Diagnosis not present

## 2024-05-04 DIAGNOSIS — D225 Melanocytic nevi of trunk: Secondary | ICD-10-CM

## 2024-05-04 DIAGNOSIS — Z86018 Personal history of other benign neoplasm: Secondary | ICD-10-CM | POA: Diagnosis not present

## 2024-05-04 DIAGNOSIS — L578 Other skin changes due to chronic exposure to nonionizing radiation: Secondary | ICD-10-CM | POA: Diagnosis not present

## 2024-05-04 DIAGNOSIS — L821 Other seborrheic keratosis: Secondary | ICD-10-CM | POA: Diagnosis not present

## 2024-05-04 DIAGNOSIS — Z79899 Other long term (current) drug therapy: Secondary | ICD-10-CM

## 2024-05-04 DIAGNOSIS — D1801 Hemangioma of skin and subcutaneous tissue: Secondary | ICD-10-CM

## 2024-05-04 DIAGNOSIS — Z1283 Encounter for screening for malignant neoplasm of skin: Secondary | ICD-10-CM

## 2024-05-04 DIAGNOSIS — L900 Lichen sclerosus et atrophicus: Secondary | ICD-10-CM | POA: Diagnosis not present

## 2024-05-04 DIAGNOSIS — L409 Psoriasis, unspecified: Secondary | ICD-10-CM | POA: Diagnosis not present

## 2024-05-04 DIAGNOSIS — D229 Melanocytic nevi, unspecified: Secondary | ICD-10-CM

## 2024-05-04 DIAGNOSIS — W098XXA Fall on or from other playground equipment, initial encounter: Secondary | ICD-10-CM | POA: Diagnosis not present

## 2024-05-04 DIAGNOSIS — D492 Neoplasm of unspecified behavior of bone, soft tissue, and skin: Secondary | ICD-10-CM | POA: Diagnosis not present

## 2024-05-04 DIAGNOSIS — Z7189 Other specified counseling: Secondary | ICD-10-CM

## 2024-05-04 MED ORDER — CLOBETASOL PROPIONATE 0.05 % EX SOLN: CUTANEOUS | 3 refills | Status: AC

## 2024-05-04 NOTE — Patient Instructions (Addendum)
 Continue Clobetasol  solution twice a day as needed for flares. Avoid applying to face, groin, and axilla. Use as directed. Long-term use can cause thinning of the skin.  Recommend salicylic acid or tar shampoo daily.   Topical steroids (such as triamcinolone , fluocinolone, fluocinonide, mometasone, clobetasol , halobetasol, betamethasone , hydrocortisone) can cause thinning and lightening of the skin if they are used for too long in the same area. Your physician has selected the right strength medicine for your problem and area affected on the body. Please use your medication only as directed by your physician to prevent side effects.     Wound Care Instructions  Cleanse wound gently with soap and water once a day then pat dry with clean gauze. Apply a thin coat of Petrolatum (petroleum jelly, "Vaseline") over the wound (unless you have an allergy to this). We recommend that you use a new, sterile tube of Vaseline. Do not pick or remove scabs. Do not remove the yellow or white "healing tissue" from the base of the wound.  Cover the wound with fresh, clean, nonstick gauze and secure with paper tape. You may use Band-Aids in place of gauze and tape if the wound is small enough, but would recommend trimming much of the tape off as there is often too much. Sometimes Band-Aids can irritate the skin.  You should call the office for your biopsy report after 1 week if you have not already been contacted.  If you experience any problems, such as abnormal amounts of bleeding, swelling, significant bruising, significant pain, or evidence of infection, please call the office immediately.  FOR ADULT SURGERY PATIENTS: If you need something for pain relief you may take 1 extra strength Tylenol  (acetaminophen ) AND 2 Ibuprofen (200mg  each) together every 4 hours as needed for pain. (do not take these if you are allergic to them or if you have a reason you should not take them.) Typically, you may only need pain  medication for 1 to 3 days.       Recommend daily broad spectrum sunscreen SPF 30+ to sun-exposed areas, reapply every 2 hours as needed. Call for new or changing lesions.  Staying in the shade or wearing long sleeves, sun glasses (UVA+UVB protection) and wide brim hats (4-inch brim around the entire circumference of the hat) are also recommended for sun protection.      Melanoma ABCDEs  Melanoma is the most dangerous type of skin cancer, and is the leading cause of death from skin disease.  You are more likely to develop melanoma if you: Have light-colored skin, light-colored eyes, or red or blond hair Spend a lot of time in the sun Tan regularly, either outdoors or in a tanning bed Have had blistering sunburns, especially during childhood Have a close family member who has had a melanoma Have atypical moles or large birthmarks  Early detection of melanoma is key since treatment is typically straightforward and cure rates are extremely high if we catch it early.   The first sign of melanoma is often a change in a mole or a new dark spot.  The ABCDE system is a way of remembering the signs of melanoma.  A for asymmetry:  The two halves do not match. B for border:  The edges of the growth are irregular. C for color:  A mixture of colors are present instead of an even brown color. D for diameter:  Melanomas are usually (but not always) greater than 6mm - the size of a pencil eraser. E  for evolution:  The spot keeps changing in size, shape, and color.  Please check your skin once per month between visits. You can use a small mirror in front and a large mirror behind you to keep an eye on the back side or your body.   If you see any new or changing lesions before your next follow-up, please call to schedule a visit.  Please continue daily skin protection including broad spectrum sunscreen SPF 30+ to sun-exposed areas, reapplying every 2 hours as needed when you're outdoors.    Staying in the shade or wearing long sleeves, sun glasses (UVA+UVB protection) and wide brim hats (4-inch brim around the entire circumference of the hat) are also recommended for sun protection.      Due to recent changes in healthcare laws, you may see results of your pathology and/or laboratory studies on MyChart before the doctors have had a chance to review them. We understand that in some cases there may be results that are confusing or concerning to you. Please understand that not all results are received at the same time and often the doctors may need to interpret multiple results in order to provide you with the best plan of care or course of treatment. Therefore, we ask that you please give us  2 business days to thoroughly review all your results before contacting the office for clarification. Should we see a critical lab result, you will be contacted sooner.   If You Need Anything After Your Visit  If you have any questions or concerns for your doctor, please call our main line at [phone] and press option 4 to reach your doctor's medical assistant. If no one answers, please leave a voicemail as directed and we will return your call as soon as possible. Messages left after 4 pm will be answered the following business day.   You may also send us  a message via MyChart. We typically respond to MyChart messages within 1-2 business days.  For prescription refills, please ask your pharmacy to contact our office. Our fax number is [phone].  If you have an urgent issue when the clinic is closed that cannot wait until the next business day, you can page your doctor at the number below.    Please note that while we do our best to be available for urgent issues outside of office hours, we are not available 24/7.   If you have an urgent issue and are unable to reach us , you may choose to seek medical care at your doctor's office, retail clinic, urgent care center, or emergency  room.  If you have a medical emergency, please immediately call 911 or go to the emergency department.  Pager Numbers  - Dr. Bary Likes: [phone]  - Dr. Annette Barters: [phone]  - Dr. Felipe Horton: [phone]   In the event of inclement weather, please call our main line at [phone] for an update on the status of any delays or closures.  Dermatology Medication Tips: Please keep the boxes that topical medications come in in order to help keep track of the instructions about where and how to use these. Pharmacies typically print the medication instructions only on the boxes and not directly on the medication tubes.   If your medication is too expensive, please contact our office at [phone] option 4 or send us  a message through MyChart.   We are unable to tell what your co-pay for medications will be in advance as this is different depending on your insurance coverage. However,  we may be able to find a substitute medication at lower cost or fill out paperwork to get insurance to cover a needed medication.   If a prior authorization is required to get your medication covered by your insurance company, please allow us  1-2 business days to complete this process.  Drug prices often vary depending on where the prescription is filled and some pharmacies may offer cheaper prices.  The website www.goodrx.com contains coupons for medications through different pharmacies. The prices here do not account for what the cost may be with help from insurance (it may be cheaper with your insurance), but the website can give you the price if you did not use any insurance.  - You can print the associated coupon and take it with your prescription to the pharmacy.  - You may also stop by our office during regular business hours and pick up a GoodRx coupon card.  - If you need your prescription sent electronically to a different pharmacy, notify our office through [facility] or by phone at  [phone] option 4.     Si Usted Necesita Algo Despus de Su Visita  Tambin puede enviarnos un mensaje a travs de Clinical cytogeneticist. Por lo general respondemos a los mensajes de MyChart en el transcurso de 1 a 2 das hbiles.  Para renovar recetas, por favor pida a su farmacia que se ponga en contacto con nuestra oficina. Franz Jacks de fax es [place] [phone].  Si tiene un asunto urgente cuando la clnica est cerrada y que no puede esperar hasta el siguiente da hbil, puede llamar/localizar a su doctor(a) al nmero que aparece a continuacin.   Por favor, tenga en cuenta que aunque hacemos todo lo posible para estar disponibles para asuntos urgentes fuera del horario de [place], no estamos disponibles las 24 horas del da, los 7 [address]  [address] de la [place].   Si tiene un problema urgente y no puede comunicarse con nosotros, puede optar por buscar atencin mdica  en el consultorio de su doctor(a), en una clnica privada, en un centro de atencin urgente o en una sala de emergencias.  Si tiene Engineer, drilling, por favor llame inmediatamente al 911 o vaya a la sala de emergencias.  Nmeros de bper  - Dr. Bary Likes: [phone]  - Dra. Annette Barters: [phone]  - Dr. Felipe Horton: [phone]   En caso de inclemencias del tiempo, por favor llame a Lajuan Pila principal al [phone] para una actualizacin sobre el [place] de cualquier retraso o cierre.  Consejos para la medicacin en dermatologa: Por favor, guarde las cajas en las que vienen los medicamentos de uso tpico para ayudarle a seguir las instrucciones sobre dnde y cmo usarlos. Las farmacias generalmente imprimen las instrucciones del medicamento slo en las cajas y no directamente en los tubos del [place].   Si su medicamento es muy caro, por favor, pngase en contacto con Bettyjane Brunet llamando al [phone] y presione la opcin 4 o envenos un mensaje a travs de Clinical cytogeneticist.   No podemos decirle cul ser su copago por los  medicamentos por adelantado ya que esto es diferente dependiendo de la cobertura de su seguro. Sin embargo, es posible que podamos encontrar un medicamento sustituto a Audiological scientist un formulario para que el seguro cubra el medicamento que se considera necesario.   Si se requiere una autorizacin previa para que su compaa de seguros [place] su medicamento, por favor permtanos de 1 a 2 das hbiles para completar este proceso.  Los precios de  los medicamentos varan con frecuencia dependiendo del lugar de dnde se surte la receta y alguna farmacias pueden ofrecer precios ms baratos.  El sitio web www.goodrx.com tiene cupones para medicamentos de Health and safety inspector. Los precios aqu no tienen en cuenta lo que podra costar con la ayuda del seguro (puede ser ms barato con su seguro), pero el sitio web puede darle el precio si no utiliz Tourist information centre manager.  - Puede imprimir el cupn correspondiente y llevarlo con su receta a la farmacia.  - Tambin puede pasar por nuestra oficina durante el horario de atencin regular y Education officer, museum una tarjeta de cupones de GoodRx.  - Si necesita que su receta se enve electrnicamente a una farmacia diferente, informe a nuestra oficina a travs de MyChart de [employer] o por telfono llamando al [phone] y presione la opcin 4.

## 2024-05-04 NOTE — Progress Notes (Unsigned)
 Follow-Up Visit   Subjective  Patricia Garner is a 68 y.o. female who presents for the following: Skin Cancer Screening and Full Body Skin Exam. Hx of dysplastic nevus.  Psoriasis follow up. Scalp. Flaring today. Uses Clobetasol  solution as needed for flares. Improves after using and stays under control ~3 months at a time.   The patient presents for Total-Body Skin Exam (TBSE) for skin cancer screening and mole check. The patient has spots, moles and lesions to be evaluated, some may be new or changing and the patient may have concern these could be cancer.  The following portions of the chart were reviewed this encounter and updated as appropriate: medications, allergies, medical history  Review of Systems:  No other skin or systemic complaints except as noted in HPI or Assessment and Plan.  Objective  Well appearing patient in no apparent distress; mood and affect are within normal limits.  A full examination was performed including scalp, head, eyes, ears, nose, lips, neck, chest, axillae, abdomen, back, buttocks, bilateral upper extremities, bilateral lower extremities, hands, feet, fingers, toes, fingernails, and toenails. All findings within normal limits unless otherwise noted below.   Relevant physical exam findings are noted in the Assessment and Plan.  Left Axilla 0.6 cm irregular brown flat papule   Assessment & Plan   PSORIASIS Exam: Well-demarcated erythematous papules/plaques with silvery scale, guttate pink scaly papules. 1% BSA. Chronic and persistent condition with duration or expected duration over one year. Condition is improving with treatment but not currently at goal. Psoriasis is a chronic non-curable, but treatable genetic/hereditary disease that may have other systemic features affecting other organ systems such as joints (Psoriatic Arthritis). It is associated with an increased risk of inflammatory bowel disease, heart disease, non-alcoholic fatty liver  disease, and depression.  Treatments include light and laser treatments; topical medications; and systemic medications including oral and injectables. Treatment Plan: Continue Clobetasol  solution twice a day as needed for flares. Avoid applying to face, groin, and axilla. Use as directed. Long-term use can cause thinning of the skin.  Recommend salicylic acid or tar shampoo daily.   Patient c/o joint pain in the knees and back.  Pt defers rheumatology referral today.  Topical steroids (such as triamcinolone , fluocinolone, fluocinonide, mometasone, clobetasol , halobetasol, betamethasone , hydrocortisone) can cause thinning and lightening of the skin if they are used for too long in the same area. Your physician has selected the right strength medicine for your problem and area affected on the body. Please use your medication only as directed by your physician to prevent side effects.   LICHEN SCLEROSUS ET ATROPHICUS Exam: area not examined today Chronic condition with duration or expected duration over one year. Currently well-controlled. Lichen sclerosus is a chronic inflammatory condition of unknown cause that frequently involves the vaginal area and less commonly extragenital skin, and is NOT sexually transmitted. It frequently causes symptoms of pain and burning.  It requires regular monitoring and treatment with topical steroids to minimize inflammation and to reduce risk of scarring. There is also a risk of cancer in the vaginal area which is very low if inflammation is well controlled. Regular checks of the area are recommended. Please call if you notice any new or changing spots within this area. Treatment Plan: Continue Clobetasol  ointment as needed for flares as directed by OB/GYN.   SKIN CANCER SCREENING PERFORMED TODAY.  HISTORY OF DYSPLASTIC NEVUS. Upper back spinal. Mild. 08/30/2016. No evidence of recurrence today Recommend regular full body skin exams Recommend daily  broad  spectrum sunscreen SPF 30+ to sun-exposed areas, reapply every 2 hours as needed.  Call if any new or changing lesions are noted between office visits  ACTINIC DAMAGE - Chronic condition, secondary to cumulative UV/sun exposure - diffuse scaly erythematous macules with underlying dyspigmentation - Recommend daily broad spectrum sunscreen SPF 30+ to sun-exposed areas, reapply every 2 hours as needed.  - Staying in the shade or wearing long sleeves, sun glasses (UVA+UVB protection) and wide brim hats (4-inch brim around the entire circumference of the hat) are also recommended for sun protection.  - Call for new or changing lesions.  LENTIGINES, SEBORRHEIC KERATOSES, HEMANGIOMAS - Benign normal skin lesions - Benign-appearing - Call for any changes  MELANOCYTIC NEVI - Tan-brown and/or pink-flesh-colored symmetric macules and papules - Benign appearing on exam today - Observation - Call clinic for new or changing moles - Recommend daily use of broad spectrum spf 30+ sunscreen to sun-exposed areas.   NEOPLASM OF SKIN Left Axilla Epidermal / dermal shaving  Lesion diameter (cm):  0.6 Informed consent: discussed and consent obtained   Timeout: patient name, date of birth, surgical site, and procedure verified   Procedure prep:  Patient was prepped and draped in usual sterile fashion Prep type:  Isopropyl alcohol Anesthesia: the lesion was anesthetized in a standard fashion   Anesthetic:  1% lidocaine  w/ epinephrine 1-100,000 buffered w/ 8.4% NaHCO3 Instrument used: flexible razor blade   Hemostasis achieved with: pressure, aluminum chloride and electrodesiccation   Outcome: patient tolerated procedure well   Post-procedure details: sterile dressing applied and wound care instructions given   Dressing type: bandage and petrolatum   Specimen 1 - Surgical pathology Differential Diagnosis: Nevus, R/O dysplastic   Check Margins: Yes LICHEN SCLEROSUS   Related Medications clobetasol   ointment (TEMOVATE ) 0.05 % Apply to affected area 1-2 times weekly as maintenance PSORIASIS   Related Medications clobetasol  (TEMOVATE ) 0.05 % external solution Apply to aa's psoriasis in the scalp QD up to 5d/wk. Avoid applying to face, groin, and axilla.  Return in about 1 year (around 05/04/2025) for TBSE, HxDN.  I, Jill Parcell, CMA, am acting as scribe for Celine Collard, MD.   Documentation: I have reviewed the above documentation for accuracy and completeness, and I agree with the above.  Celine Collard, MD

## 2024-05-08 LAB — SURGICAL PATHOLOGY

## 2024-05-11 ENCOUNTER — Encounter: Payer: Self-pay | Admitting: Dermatology

## 2024-05-11 ENCOUNTER — Encounter (HOSPITAL_COMMUNITY): Payer: Self-pay

## 2024-05-12 ENCOUNTER — Ambulatory Visit: Payer: Self-pay

## 2024-05-12 NOTE — Telephone Encounter (Signed)
Patient advised of BX result. aw

## 2024-05-12 NOTE — Telephone Encounter (Signed)
 Left voicemail for patient to return my call.

## 2024-05-12 NOTE — Telephone Encounter (Signed)
-----   Message from Celine Collard sent at 05/11/2024  5:18 PM EDT ----- FINAL DIAGNOSIS        1. 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

## 2024-05-18 ENCOUNTER — Encounter: Payer: Self-pay | Admitting: Internal Medicine

## 2024-05-18 ENCOUNTER — Ambulatory Visit (INDEPENDENT_AMBULATORY_CARE_PROVIDER_SITE_OTHER): Payer: PPO | Admitting: Internal Medicine

## 2024-05-18 VITALS — BP 128/74 | Ht 63.0 in | Wt 264.0 lb

## 2024-05-18 DIAGNOSIS — E782 Mixed hyperlipidemia: Secondary | ICD-10-CM

## 2024-05-18 DIAGNOSIS — Z6841 Body Mass Index (BMI) 40.0 and over, adult: Secondary | ICD-10-CM

## 2024-05-18 DIAGNOSIS — Z0001 Encounter for general adult medical examination with abnormal findings: Secondary | ICD-10-CM | POA: Diagnosis not present

## 2024-05-18 DIAGNOSIS — R7303 Prediabetes: Secondary | ICD-10-CM | POA: Diagnosis not present

## 2024-05-18 DIAGNOSIS — E66813 Obesity, class 3: Secondary | ICD-10-CM

## 2024-05-18 NOTE — Assessment & Plan Note (Signed)
 Encourage diet and exercise for weight loss

## 2024-05-18 NOTE — Patient Instructions (Signed)
 Health Maintenance for Postmenopausal Women Menopause is a normal process in which your ability to get pregnant comes to an end. This process happens slowly over many months or years, usually between the ages of 24 and 62. Menopause is complete when you have missed your menstrual period for 12 months. It is important to talk with your health care provider about some of the most common conditions that affect women after menopause (postmenopausal women). These include heart disease, cancer, and bone loss (osteoporosis). Adopting a healthy lifestyle and getting preventive care can help to promote your health and wellness. The actions you take can also lower your chances of developing some of these common conditions. What are the signs and symptoms of menopause? During menopause, you may have the following symptoms: Hot flashes. These can be moderate or severe. Night sweats. Decrease in sex drive. Mood swings. Headaches. Tiredness (fatigue). Irritability. Memory problems. Problems falling asleep or staying asleep. Talk with your health care provider about treatment options for your symptoms. Do I need hormone replacement therapy? Hormone replacement therapy is effective in treating symptoms that are caused by menopause, such as hot flashes and night sweats. Hormone replacement carries certain risks, especially as you become older. If you are thinking about using estrogen or estrogen with progestin, discuss the benefits and risks with your health care provider. How can I reduce my risk for heart disease and stroke? The risk of heart disease, heart attack, and stroke increases as you age. One of the causes may be a change in the body's hormones during menopause. This can affect how your body uses dietary fats, triglycerides, and cholesterol. Heart attack and stroke are medical emergencies. There are many things that you can do to help prevent heart disease and stroke. Watch your blood pressure High  blood pressure causes heart disease and increases the risk of stroke. This is more likely to develop in people who have high blood pressure readings or are overweight. Have your blood pressure checked: Every 3-5 years if you are 50-75 years of age. Every year if you are 77 years old or older. Eat a healthy diet  Eat a diet that includes plenty of vegetables, fruits, low-fat dairy products, and lean protein. Do not eat a lot of foods that are high in solid fats, added sugars, or sodium. Get regular exercise Get regular exercise. This is one of the most important things you can do for your health. Most adults should: Try to exercise for at least 150 minutes each week. The exercise should increase your heart rate and make you sweat (moderate-intensity exercise). Try to do strengthening exercises at least twice each week. Do these in addition to the moderate-intensity exercise. Spend less time sitting. Even light physical activity can be beneficial. Other tips Work with your health care provider to achieve or maintain a healthy weight. Do not use any products that contain nicotine or tobacco. These products include cigarettes, chewing tobacco, and vaping devices, such as e-cigarettes. If you need help quitting, ask your health care provider. Know your numbers. Ask your health care provider to check your cholesterol and your blood sugar (glucose). Continue to have your blood tested as directed by your health care provider. Do I need screening for cancer? Depending on your health history and family history, you may need to have cancer screenings at different stages of your life. This may include screening for: Breast cancer. Cervical cancer. Lung cancer. Colorectal cancer. What is my risk for osteoporosis? After menopause, you may be  at increased risk for osteoporosis. Osteoporosis is a condition in which bone destruction happens more quickly than new bone creation. To help prevent osteoporosis or  the bone fractures that can happen because of osteoporosis, you may take the following actions: If you are 61-3 years old, get at least 1,000 mg of calcium and at least 600 international units (IU) of vitamin D per day. If you are older than age 61 but younger than age 75, get at least 1,200 mg of calcium and at least 600 international units (IU) of vitamin D per day. If you are older than age 62, get at least 1,200 mg of calcium and at least 800 international units (IU) of vitamin D per day. Smoking and drinking excessive alcohol increase the risk of osteoporosis. Eat foods that are rich in calcium and vitamin D, and do weight-bearing exercises several times each week as directed by your health care provider. How does menopause affect my mental health? Depression may occur at any age, but it is more common as you become older. Common symptoms of depression include: Feeling depressed. Changes in sleep patterns. Changes in appetite or eating patterns. Feeling an overall lack of motivation or enjoyment of activities that you previously enjoyed. Frequent crying spells. Talk with your health care provider if you think that you are experiencing any of these symptoms. General instructions See your health care provider for regular wellness exams and vaccines. This may include: Scheduling regular health, dental, and eye exams. Getting and maintaining your vaccines. These include: Influenza vaccine. Get this vaccine each year before the flu season begins. Pneumonia vaccine. Shingles vaccine. Tetanus, diphtheria, and pertussis (Tdap) booster vaccine. Your health care provider may also recommend other immunizations. Tell your health care provider if you have ever been abused or do not feel safe at home. Summary Menopause is a normal process in which your ability to get pregnant comes to an end. This condition causes hot flashes, night sweats, decreased interest in sex, mood swings, headaches, or lack  of sleep. Treatment for this condition may include hormone replacement therapy. Take actions to keep yourself healthy, including exercising regularly, eating a healthy diet, watching your weight, and checking your blood pressure and blood sugar levels. Get screened for cancer and depression. Make sure that you are up to date with all your vaccines. This information is not intended to replace advice given to you by your health care provider. Make sure you discuss any questions you have with your health care provider. Document Revised: 05/08/2021 Document Reviewed: 05/08/2021 Elsevier Patient Education  2024 [employer].

## 2024-05-18 NOTE — Progress Notes (Signed)
 Subjective:    Patient ID: Patricia Garner, female    DOB: 1956/11/14, 68 y.o.   MRN: [id number]  HPI  Patient presents to clinic today for her annual exam.  Flu: never Tetanus: > 10 years ago COVID: never Pneumovax: never Prevnar: never Shingrix: never Pap smear: 02/2024 Mammogram: 03/2024 Bone density: 03/2023 Colon screening: 09/2023 Vision screening: as needed Dentist: annually  Diet: She does eat meat. She consumes fruits and veggies. She does eat some fried foods. She drinks mostly water. Exercise: Gym 3 x week  Review of Systems     Past Medical History:  Diagnosis Date   Allergy    Arthritis    Asthma    Barrett esophagus    Chicken pox    Chronic vaginitis    Dysplastic nevus 08/30/2016   upper back spinal, mild   Dysplastic nevus 05/04/2024   L axilla - mild   GERD (gastroesophageal reflux disease)    History of colon polyps    Hyperlipidemia    Hypertension    Osteoporosis    Pre-diabetes    Psoriasis (a type of skin inflammation)    to the scalp   Vitamin D  deficiency    Vulvar intraepithelial neoplasia (VIN) grade 1     Current Outpatient Medications  Medication Sig Dispense Refill   albuterol  (VENTOLIN  HFA) 108 (90 Base) MCG/ACT inhaler Inhale 2 puffs into the lungs every 6 (six) hours as needed for wheezing or shortness of breath.     albuterol  (VENTOLIN  HFA) 108 (90 Base) MCG/ACT inhaler INHALE 1 TO 2 PUFFS BY MOUTH EVERY 6 HOURS AS NEEDED FOR WHEEZING FOR SHORTNESS OF BREATH 9 g 2   Ascorbic Acid (VITAMIN C) 1000 MG tablet Take 1,000 mg by mouth daily.     Biotin 53664 MCG TABS Take by mouth.     clobetasol  (TEMOVATE ) 0.05 % external solution Apply to aa's psoriasis in the scalp QD up to 5d/wk. Avoid applying to face, groin, and axilla. 50 mL 3   clobetasol  ointment (TEMOVATE ) 0.05 % Apply to affected area 1-2 times weekly as maintenance 45 g 1   cyanocobalamin (VITAMIN B12) 1000 MCG tablet Take 1,000 mcg by mouth daily.     ferrous sulfate 325  (65 FE) MG EC tablet Take 325 mg by mouth as needed.     ibuprofen (ADVIL) 100 MG/5ML suspension Take 400 mg by mouth every 4 (four) hours as needed.     lisinopril -hydrochlorothiazide  (ZESTORETIC ) 20-25 MG tablet TAKE 1 TABLET BY MOUTH ONCE DAILY (OFFICE  VISIT  REQUIRED  FOR  FURTHER  REFILLS) 90 tablet 1   Moringa 500 MG CAPS Take by mouth. (Patient not taking: Reported on 03/03/2024)     omega-3 fish oil (MAXEPA) 1000 MG CAPS capsule Take by mouth.     pantoprazole  (PROTONIX ) 40 MG tablet Take 1 tablet (40 mg total) by mouth daily. OFFICE VISIT NEEDED FOR ADDITIONAL REFILLS 90 tablet 1   triamcinolone  (NASACORT ) 55 MCG/ACT AERO nasal inhaler Place 2 sprays into the nose daily. 1 each 0   TURMERIC PO Take 1,000 mg by mouth.     VITAMIN D , CHOLECALCIFEROL, PO Take 1 capsule by mouth daily at 6 (six) AM.     No current facility-administered medications for this visit.    Allergies  Allergen Reactions   Bactrim [Sulfamethoxazole-Trimethoprim] Nausea Only   Codeine Nausea And Vomiting   Hydrocodone Hives   Lipitor [Atorvastatin ] Other (See Comments)    Myalgia    Medrol [Methylprednisolone]  Nausea Only   Sulfa Antibiotics Nausea Only    Family History  Problem Relation Age of Onset   Arthritis Mother    Heart disease Mother    Stroke Mother    Hypertension Mother    Alcohol abuse Father    Alcohol abuse Sister    Mental illness Sister    Stomach cancer Sister 39   Alcohol abuse Brother    Drug abuse Brother    Arthritis Maternal Grandmother    Stroke Maternal Grandmother    Colon cancer Maternal Grandfather 58   Breast cancer Neg Hx     Social History   Socioeconomic History   Marital status: Married    Spouse name: Not on file   Number of children: Not on file   Years of education: Not on file   Highest education level: Not on file  Occupational History   Not on file  Tobacco Use   Smoking status: Never   Smokeless tobacco: Never  Vaping Use   Vaping status:  Never Used  Substance and Sexual Activity   Alcohol use: No    Alcohol/week: 0.0 standard drinks of alcohol   Drug use: Never   Sexual activity: Yes    Birth control/protection: Post-menopausal  Other Topics Concern   Not on file  Social History Narrative   Married and  Lives at home  with husband .   Social Drivers of Corporate investment banker Strain: Low Risk  (02/28/2024)   Overall Financial Resource Strain (CARDIA)    Difficulty of Paying Living Expenses: Not very hard  Food Insecurity: No Food Insecurity (02/28/2024)   Hunger Vital Sign    Worried About Running Out of Food in the Last Year: Never true    Ran Out of Food in the Last Year: Never true  Transportation Needs: No Transportation Needs (02/28/2024)   PRAPARE - Administrator, Civil Service (Medical): No    Lack of Transportation (Non-Medical): No  Physical Activity: Inactive (02/28/2024)   Exercise Vital Sign    Days of Exercise per Week: 0 days    Minutes of Exercise per Session: 0 min  Stress: No Stress Concern Present (02/28/2024)   [employer] of Occupational Health - Occupational Stress Questionnaire    Feeling of Stress : Not at all  Social Connections: Moderately Integrated (02/28/2024)   Social Connection and Isolation Panel [NHANES]    Frequency of Communication with Friends and Family: More than three times a week    Frequency of Social Gatherings with Friends and Family: More than three times a week    Attends Religious Services: More than 4 times per year    Active Member of [employer] or Organizations: No    Attends Banker Meetings: Never    Marital Status: Married  Catering manager Violence: Not At Risk (02/28/2024)   Humiliation, Afraid, Rape, and Kick questionnaire    Fear of Current or Ex-Partner: No    Emotionally Abused: No    Physically Abused: No    Sexually Abused: No     Constitutional: Denies fever, malaise, fatigue, headache or abrupt weight changes.  HEENT:  Denies eye pain, eye redness, ear pain, ringing in the ears, wax buildup, runny nose, nasal congestion, bloody nose, or sore throat. Respiratory: Denies difficulty breathing, shortness of breath, cough or sputum production.   Cardiovascular: Denies chest pain, chest tightness, palpitations or swelling in the hands or feet.  Gastrointestinal: Denies abdominal pain, bloating, constipation, diarrhea or  blood in the stool.  GU: Denies urgency, frequency, pain with urination, burning sensation, blood in urine, odor or discharge. Musculoskeletal: Pt reports joint pain in knee. Denies decrease in range of motion, difficulty with gait, muscle pain or joint swelling.  Skin: Denies redness, rashes, lesions or ulcercations.  Neurological: Denies dizziness, difficulty with memory, difficulty with speech or problems with balance and coordination.  Psych: Denies anxiety, depression, SI/HI.  No other specific complaints in a complete review of systems (except as listed in HPI above).  Objective:   Physical Exam  BP 128/74 (BP Location: Left Arm, Patient Position: Sitting, Cuff Size: Large)   Ht 5\' 3"  (1.6 m)   Wt 264 lb (119.7 kg)   BMI 46.77 kg/m    Wt Readings from Last 3 Encounters:  03/03/24 260 lb (117.9 kg)  12/23/23 261 lb 3.2 oz (118.5 kg)  09/16/23 257 lb 12.8 oz (116.9 kg)    General: Appears her stated age, obese, in NAD. Skin: Warm, dry and intact.  HEENT: Head: normal shape and size; Eyes: sclera white, no icterus, conjunctiva pink, PERRLA and EOMs intact;  Neck:  Neck supple, trachea midline. No masses, lumps or thyromegaly present.  Cardiovascular: Normal rate and rhythm. S1,S2 noted.  No murmur, rubs or gallops noted. No JVD or BLE edema. No carotid bruits noted. Pulmonary/Chest: Normal effort and positive vesicular breath sounds. No respiratory distress. No wheezes, rales or ronchi noted.  Abdomen: Normal bowel sounds.  Musculoskeletal: Joint enlargement noted of bilateral knees,  L >R.  Gait steady without device. Neurological: Alert and oriented. Cranial nerves II-XII grossly intact. Coordination normal.  Psychiatric: Mood and affect normal. Behavior is normal. Judgment and thought content normal.     BMET    Component Value Date/Time   NA 140 12/23/2023 1327   K 4.0 12/23/2023 1327   CL 101 12/23/2023 1327   CO2 25 12/23/2023 1327   GLUCOSE 118 12/23/2023 1327   BUN 21 12/23/2023 1327   CREATININE 0.76 12/23/2023 1327   CALCIUM  10.0 12/23/2023 1327    Lipid Panel     Component Value Date/Time   CHOL 253 (H) 12/23/2023 1327   TRIG 335 (H) 12/23/2023 1327   HDL 45 (L) 12/23/2023 1327   CHOLHDL 5.6 (H) 12/23/2023 1327   VLDL 30.6 09/01/2020 1440   LDLCALC 156 (H) 12/23/2023 1327    CBC    Component Value Date/Time   WBC 8.7 12/23/2023 1327   RBC 3.99 12/23/2023 1327   HGB 12.0 12/23/2023 1327   HCT 36.9 12/23/2023 1327   PLT 343 12/23/2023 1327   MCV 92.5 12/23/2023 1327   MCH 30.1 12/23/2023 1327   MCHC 32.5 12/23/2023 1327   RDW 12.4 12/23/2023 1327    Hgb A1C Lab Results  Component Value Date   HGBA1C 6.2 (H) 12/23/2023          Assessment & Plan:   Preventative Health Maintenance:  Encouraged her to get a flu shot in the fall She declines tetanus Encouraged her to get her covid vaccine She declines Pneumovax or Prevnar 20 Discussed Shingrix vaccine, she will check coverage with her insurance company and schedule a visit if she would like to have this done Pap smear UTD Mammogram UTD Bone density UTD Colon screening UTD Encouraged her to consume a balanced diet and exercise regimen Advised her to see an eye doctor and dentist annually Will check CBC, CMET, Lipid, A1C today  RTC in 6 months, follow up chronic conditions Helayne Lo,  NP

## 2024-05-19 ENCOUNTER — Ambulatory Visit: Payer: Self-pay | Admitting: Internal Medicine

## 2024-05-19 LAB — COMPREHENSIVE METABOLIC PANEL WITH GFR
AG Ratio: 2 (calc) (ref 1.0–2.5)
ALT: 19 U/L (ref 6–29)
AST: 13 U/L (ref 10–35)
Albumin: 4.6 g/dL (ref 3.6–5.1)
Alkaline phosphatase (APISO): 66 U/L (ref 37–153)
BUN: 18 mg/dL (ref 7–25)
CO2: 27 mmol/L (ref 20–32)
Calcium: 10.2 mg/dL (ref 8.6–10.4)
Chloride: 102 mmol/L (ref 98–110)
Creat: 0.78 mg/dL (ref 0.50–1.05)
Globulin: 2.3 g/dL (ref 1.9–3.7)
Glucose, Bld: 132 mg/dL — ABNORMAL HIGH (ref 65–99)
Potassium: 4 mmol/L (ref 3.5–5.3)
Sodium: 140 mmol/L (ref 135–146)
Total Bilirubin: 0.4 mg/dL (ref 0.2–1.2)
Total Protein: 6.9 g/dL (ref 6.1–8.1)
eGFR: 84 mL/min/{1.73_m2} (ref >=60)

## 2024-05-19 LAB — LIPID PANEL
Cholesterol: 230 mg/dL — ABNORMAL HIGH (ref <200)
HDL: 46 mg/dL — ABNORMAL LOW (ref >=50)
LDL Cholesterol (Calc): 143 mg/dL — ABNORMAL HIGH
Non-HDL Cholesterol (Calc): 184 mg/dL — ABNORMAL HIGH (ref <130)
Total CHOL/HDL Ratio: 5 (calc) — ABNORMAL HIGH (ref <5.0)
Triglycerides: 268 mg/dL — ABNORMAL HIGH (ref <150)

## 2024-05-19 LAB — CBC
HCT: 37.3 % (ref 35.0–45.0)
Hemoglobin: 12.1 g/dL (ref 11.7–15.5)
MCH: 30 pg (ref 27.0–33.0)
MCHC: 32.4 g/dL (ref 32.0–36.0)
MCV: 92.3 fL (ref 80.0–100.0)
MPV: 10.9 fL (ref 7.5–12.5)
Platelets: 282 10*3/uL (ref 140–400)
RBC: 4.04 10*6/uL (ref 3.80–5.10)
RDW: 12.5 % (ref 11.0–15.0)
WBC: 7.9 10*3/uL (ref 3.8–10.8)

## 2024-05-19 LAB — HEMOGLOBIN A1C
Hgb A1c MFr Bld: 6.3 % — ABNORMAL HIGH (ref <5.7)
Mean Plasma Glucose: 134 mg/dL
eAG (mmol/L): 7.4 mmol/L

## 2024-06-22 ENCOUNTER — Other Ambulatory Visit: Payer: Self-pay | Admitting: Internal Medicine

## 2024-06-22 DIAGNOSIS — I10 Essential (primary) hypertension: Secondary | ICD-10-CM

## 2024-06-22 DIAGNOSIS — Z0001 Encounter for general adult medical examination with abnormal findings: Secondary | ICD-10-CM

## 2024-06-23 NOTE — Telephone Encounter (Signed)
 Requested Prescriptions  Pending Prescriptions Disp Refills   lisinopril -hydrochlorothiazide  (ZESTORETIC ) 20-25 MG tablet [Pharmacy Med Name: Lisinopril -hydroCHLOROthiazide  20-25 MG Oral Tablet] 90 tablet 1    Sig: Take 1 tablet by mouth once daily     Cardiovascular:  ACEI + Diuretic Combos Failed - 06/23/2024  3:30 PM      Failed - Valid encounter within last 6 months    Recent Outpatient Visits           1 month ago Encounter for general adult medical examination with abnormal findings   [facility], Angeline ORN, NP       Future Appointments             In 10 months Hester Alm BROCKS, MD [facility]            Passed - Na in normal range and within 180 days    Sodium  Date Value Ref Range Status  05/18/2024 140 135 - 146 mmol/L Final         Passed - K in normal range and within 180 days    Potassium  Date Value Ref Range Status  05/18/2024 4.0 3.5 - 5.3 mmol/L Final         Passed - Cr in normal range and within 180 days    Creat  Date Value Ref Range Status  05/18/2024 0.78 0.50 - 1.05 mg/dL Final         Passed - eGFR is 30 or above and within 180 days    GFR  Date Value Ref Range Status  09/01/2020 72.49 >60.00 mL/min Final   eGFR  Date Value Ref Range Status  05/18/2024 84 > OR = 60 mL/min/1.42m2 Final         Passed - Patient is not pregnant      Passed - Last BP in normal range    BP Readings from Last 1 Encounters:  05/18/24 128/74

## 2024-07-08 DIAGNOSIS — I10 Essential (primary) hypertension: Secondary | ICD-10-CM | POA: Diagnosis not present

## 2024-07-08 DIAGNOSIS — E66813 Obesity, class 3: Secondary | ICD-10-CM | POA: Diagnosis not present

## 2024-07-08 DIAGNOSIS — J4520 Mild intermittent asthma, uncomplicated: Secondary | ICD-10-CM | POA: Diagnosis not present

## 2024-07-08 DIAGNOSIS — Z01818 Encounter for other preprocedural examination: Secondary | ICD-10-CM | POA: Diagnosis not present

## 2024-07-08 DIAGNOSIS — E782 Mixed hyperlipidemia: Secondary | ICD-10-CM | POA: Diagnosis not present

## 2024-07-08 DIAGNOSIS — Z6841 Body Mass Index (BMI) 40.0 and over, adult: Secondary | ICD-10-CM | POA: Diagnosis not present

## 2024-07-27 ENCOUNTER — Other Ambulatory Visit: Payer: Self-pay | Admitting: Internal Medicine

## 2024-07-27 DIAGNOSIS — Z0001 Encounter for general adult medical examination with abnormal findings: Secondary | ICD-10-CM

## 2024-07-28 NOTE — Telephone Encounter (Signed)
 Requested medication (s) are due for refill today: Yes  Requested medication (s) are on the active medication list: Yes  Last refill:  03/22/24  Future visit scheduled: Yes  Notes to clinic:  Prescription is expired.    Requested Prescriptions  Pending Prescriptions Disp Refills   pantoprazole  (PROTONIX ) 40 MG tablet [Pharmacy Med Name: Pantoprazole  Sodium 40 MG Oral Tablet Delayed Release] 90 tablet 0    Sig: Take 1 tablet by mouth once daily     Gastroenterology: Proton Pump Inhibitors Passed - 07/28/2024  2:20 PM      Passed - Valid encounter within last 12 months    Recent Outpatient Visits           2 months ago Encounter for general adult medical examination with abnormal findings   [facility] [employer], Angeline ORN, NP       Future Appointments             In 9 months Hester Alm BROCKS, MD [facility]

## 2024-08-19 DIAGNOSIS — H02834 Dermatochalasis of left upper eyelid: Secondary | ICD-10-CM | POA: Diagnosis not present

## 2024-08-19 DIAGNOSIS — Z882 Allergy status to sulfonamides status: Secondary | ICD-10-CM | POA: Diagnosis not present

## 2024-08-19 DIAGNOSIS — H53453 Other localized visual field defect, bilateral: Secondary | ICD-10-CM | POA: Diagnosis not present

## 2024-08-19 DIAGNOSIS — Z8249 Family history of ischemic heart disease and other diseases of the circulatory system: Secondary | ICD-10-CM | POA: Diagnosis not present

## 2024-08-19 DIAGNOSIS — I10 Essential (primary) hypertension: Secondary | ICD-10-CM | POA: Diagnosis not present

## 2024-08-19 DIAGNOSIS — H02831 Dermatochalasis of right upper eyelid: Secondary | ICD-10-CM | POA: Diagnosis not present

## 2024-08-19 DIAGNOSIS — H02403 Unspecified ptosis of bilateral eyelids: Secondary | ICD-10-CM | POA: Diagnosis not present

## 2024-10-12 ENCOUNTER — Telehealth: Payer: Self-pay

## 2024-10-12 DIAGNOSIS — L4050 Arthropathic psoriasis, unspecified: Secondary | ICD-10-CM

## 2024-10-12 NOTE — Telephone Encounter (Signed)
 Patient came in office today asking for new referral to rheumatology. She had declined at last office visit.  Okay to send?

## 2024-10-13 NOTE — Telephone Encounter (Signed)
 Tried calling patient on home and cell.  No VM options on cell and left message on home number.  Referral sent to Dr. Tobie at [facility].

## 2024-10-13 NOTE — Addendum Note (Signed)
 Addended by: TERESA PALMA R on: 10/13/2024 02:02 PM   Modules accepted: Orders

## 2024-11-02 ENCOUNTER — Other Ambulatory Visit: Payer: Self-pay | Admitting: Internal Medicine

## 2024-11-02 DIAGNOSIS — Z0001 Encounter for general adult medical examination with abnormal findings: Secondary | ICD-10-CM

## 2024-11-02 NOTE — Telephone Encounter (Unsigned)
 Copied from CRM [id number]. Topic: Clinical - Medication Refill >> Nov 02, 2024  4:01 PM Wess RAMAN wrote: Medication: pantoprazole  (PROTONIX ) 40 MG tablet   Has the patient contacted their pharmacy? Yes (Agent: If no, request that the patient contact the pharmacy for the refill. If patient does not wish to contact the pharmacy document the reason why and proceed with request.) (Agent: If yes, when and what did the pharmacy advise?) They have not heard for the doctor's office  This is the patient's preferred pharmacy:  [facility] [address], [place] - [address] [place] [address] Phone: [phone] Fax: [phone]  Is this the correct pharmacy for this prescription? Yes If no, delete pharmacy and type the correct one.   Has the prescription been filled recently? Yes  Is the patient out of the medication? No  Has the patient been seen for an appointment in the last year OR does the patient have an upcoming appointment? Yes  Can we respond through MyChart? Yes  Agent: Please be advised that Rx refills may take up to 3 business days. We ask that you follow-up with your pharmacy.

## 2024-11-03 MED ORDER — PANTOPRAZOLE SODIUM 40 MG PO TBEC: 40.0000 mg | DELAYED_RELEASE_TABLET | Freq: Every day | ORAL | 1 refills | Status: DC

## 2024-11-03 NOTE — Telephone Encounter (Signed)
 Requested Prescriptions  Pending Prescriptions Disp Refills   pantoprazole  (PROTONIX ) 40 MG tablet 90 tablet 1    Sig: Take 1 tablet (40 mg total) by mouth daily.     Gastroenterology: Proton Pump Inhibitors Passed - 11/03/2024  5:52 PM      Passed - Valid encounter within last 12 months    Recent Outpatient Visits           5 months ago Encounter for general adult medical examination with abnormal findings   [facility] [place], Angeline ORN, NP       Future Appointments             In 6 months Hester Alm BROCKS, MD [facility]

## 2024-11-18 ENCOUNTER — Ambulatory Visit: Admitting: Internal Medicine

## 2024-12-07 DIAGNOSIS — Z111 Encounter for screening for respiratory tuberculosis: Secondary | ICD-10-CM | POA: Diagnosis not present

## 2024-12-07 DIAGNOSIS — L4050 Arthropathic psoriasis, unspecified: Secondary | ICD-10-CM | POA: Diagnosis not present

## 2024-12-16 ENCOUNTER — Other Ambulatory Visit: Payer: Self-pay | Admitting: Internal Medicine

## 2024-12-16 DIAGNOSIS — I10 Essential (primary) hypertension: Secondary | ICD-10-CM

## 2024-12-16 DIAGNOSIS — Z0001 Encounter for general adult medical examination with abnormal findings: Secondary | ICD-10-CM

## 2024-12-18 NOTE — Telephone Encounter (Signed)
 Requested Prescriptions  Pending Prescriptions Disp Refills   lisinopril -hydrochlorothiazide  (ZESTORETIC ) 20-25 MG tablet [Pharmacy Med Name: Lisinopril -hydroCHLOROthiazide  20-25 MG Oral Tablet] 30 tablet 0    Sig: Take 1 tablet by mouth once daily     Cardiovascular:  ACEI + Diuretic Combos Failed - 12/18/2024  2:43 PM      Failed - Na in normal range and within 180 days    Sodium  Date Value Ref Range Status  05/18/2024 140 135 - 146 mmol/L Final         Failed - K in normal range and within 180 days    Potassium  Date Value Ref Range Status  05/18/2024 4.0 3.5 - 5.3 mmol/L Final         Failed - Cr in normal range and within 180 days    Creat  Date Value Ref Range Status  05/18/2024 0.78 0.50 - 1.05 mg/dL Final         Failed - eGFR is 30 or above and within 180 days    GFR  Date Value Ref Range Status  09/01/2020 72.49 >60.00 mL/min Final   eGFR  Date Value Ref Range Status  05/18/2024 84 > OR = 60 mL/min/1.31m2 Final         Failed - Valid encounter within last 6 months    Recent Outpatient Visits           7 months ago Encounter for general adult medical examination with abnormal findings   Patricia Garner [facility] [place], Angeline ORN, NP       Future Appointments             In 4 months Hester Alm BROCKS, MD [facility]            Passed - Patient is not pregnant      Passed - Last BP in normal range    BP Readings from Last 1 Encounters:  05/18/24 128/74

## 2025-01-18 ENCOUNTER — Encounter: Payer: Self-pay | Admitting: Physician Assistant

## 2025-01-18 ENCOUNTER — Ambulatory Visit: Payer: Self-pay | Admitting: Physician Assistant

## 2025-01-18 VITALS — BP 119/70 | HR 90 | Temp 98.0°F | Resp 16 | Ht 63.0 in | Wt 252.0 lb

## 2025-01-18 DIAGNOSIS — K219 Gastro-esophageal reflux disease without esophagitis: Secondary | ICD-10-CM | POA: Diagnosis not present

## 2025-01-18 DIAGNOSIS — R5383 Other fatigue: Secondary | ICD-10-CM

## 2025-01-18 DIAGNOSIS — Z7689 Persons encountering health services in other specified circumstances: Secondary | ICD-10-CM

## 2025-01-18 DIAGNOSIS — R7303 Prediabetes: Secondary | ICD-10-CM

## 2025-01-18 DIAGNOSIS — L4050 Arthropathic psoriasis, unspecified: Secondary | ICD-10-CM

## 2025-01-18 DIAGNOSIS — E782 Mixed hyperlipidemia: Secondary | ICD-10-CM

## 2025-01-18 DIAGNOSIS — I10 Essential (primary) hypertension: Secondary | ICD-10-CM

## 2025-01-18 DIAGNOSIS — Z0001 Encounter for general adult medical examination with abnormal findings: Secondary | ICD-10-CM

## 2025-01-18 DIAGNOSIS — E611 Iron deficiency: Secondary | ICD-10-CM

## 2025-01-18 DIAGNOSIS — S99921A Unspecified injury of right foot, initial encounter: Secondary | ICD-10-CM

## 2025-01-18 DIAGNOSIS — Z1329 Encounter for screening for other suspected endocrine disorder: Secondary | ICD-10-CM

## 2025-01-18 MED ORDER — LISINOPRIL-HYDROCHLOROTHIAZIDE 20-25 MG PO TABS: 1.0000 | ORAL_TABLET | Freq: Every day | ORAL | 0 refills | Status: AC

## 2025-01-18 MED ORDER — PANTOPRAZOLE SODIUM 40 MG PO TBEC: 40.0000 mg | DELAYED_RELEASE_TABLET | Freq: Every day | ORAL | 1 refills | Status: AC

## 2025-01-18 NOTE — Progress Notes (Signed)
 [facility] [address] [place], [place] [address]  Internal MEDICINE  Office Visit Note  Patient Name: Patricia Garner  [id number]  [id number]  Date of Service: 01/18/2025   Complaints/HPI Pt is here for establishment of PCP. Chief Complaint  Patient presents with   New Patient (Initial Visit)   Hypertension   Hyperlipidemia   Gastroesophageal Reflux   HPI Pt is here to establish care, her husband is also with her -hx of HTN, taking zestoretic  -never smoker, no alcohol use -taking pantoprazole , had a scope done for concern of some internal bleeding at one point and saw signs of barretts esophagus so taking pantoprazole  for this. Last endoscopy did not show this anymore. Followed by GI -hx of anemia and takes iron at times -Seeing rheumatology for psoriatic arthritis, but not on meds yet. Deciding which meds -seeing GYN annual visits, has lichen sclerosus, uses topical -states when husband had shoulder surgery a few months ago she was helping him and accidentally kicked wall. Now right 2nd toe still hurts and seems like it is further from big toe without any obvious sign of a fracture -declines vaccines, otherwise UTD on PHM  Current Medication: Outpatient Encounter Medications as of 01/18/2025  Medication Sig Note   albuterol  (VENTOLIN  HFA) 108 (90 Base) MCG/ACT inhaler Inhale 2 puffs into the lungs every 6 (six) hours as needed for wheezing or shortness of breath.    Ascorbic Acid (VITAMIN C) 1000 MG tablet Take 1,000 mg by mouth daily.    Biotin 89999 MCG TABS Take by mouth.    clobetasol  (TEMOVATE ) 0.05 % external solution Apply to aa's psoriasis in the scalp QD up to 5d/wk. Avoid applying to face, groin, and axilla.    clobetasol  ointment (TEMOVATE ) 0.05 % Apply to affected area 1-2 times weekly as maintenance    cyanocobalamin (VITAMIN B12) 1000 MCG tablet Take 1,000 mcg by mouth daily.    ibuprofen (ADVIL) 100 MG/5ML suspension Take 400 mg by mouth every 4  (four) hours as needed.    Moringa 500 MG CAPS Take by mouth.    omega-3 fish oil (MAXEPA) 1000 MG CAPS capsule Take by mouth.    Pediatric Multiple Vitamins (MULTIVITAMIN CHILDRENS, W/ FA,) CHEW Chew by mouth.    triamcinolone  (NASACORT ) 55 MCG/ACT AERO nasal inhaler Place 2 sprays into the nose daily. 02/28/2024: TAKES PRN   TURMERIC PO Take 1,000 mg by mouth.    VITAMIN D , CHOLECALCIFEROL, PO Take 1 capsule by mouth daily at 6 (six) AM.    [DISCONTINUED] lisinopril -hydrochlorothiazide  (ZESTORETIC ) 20-25 MG tablet Take 1 tablet by mouth once daily    [DISCONTINUED] pantoprazole  (PROTONIX ) 40 MG tablet Take 1 tablet (40 mg total) by mouth daily.    lisinopril -hydrochlorothiazide  (ZESTORETIC ) 20-25 MG tablet Take 1 tablet by mouth daily.    pantoprazole  (PROTONIX ) 40 MG tablet Take 1 tablet (40 mg total) by mouth daily.    No facility-administered encounter medications on file as of 01/18/2025.    Surgical History: Past Surgical History:  Procedure Laterality Date   CERVICAL POLYPECTOMY N/A 03/27/2023   Procedure: CERVICAL POLYPECTOMY/ BIOPSY;  Surgeon: Elby Webb Loges, MD;  Location: [facility] ORS;  Service: Gynecology;  Laterality: N/A;   CHOLECYSTECTOMY  1983   COLONOSCOPY WITH PROPOFOL  N/A 08/28/2016   Procedure: COLONOSCOPY WITH PROPOFOL ;  Surgeon: Gladis RAYMOND Mariner, MD;  Location: [facility] ENDOSCOPY;  Service: Endoscopy;  Laterality: N/A;   COLONOSCOPY WITH PROPOFOL  N/A 09/16/2023   Procedure: COLONOSCOPY WITH PROPOFOL ;  Surgeon: Maryruth Ole DASEN,  MD;  Location: [facility] ENDOSCOPY;  Service: Endoscopy;  Laterality: N/A;   COLPOSCOPY N/A 03/27/2023   Procedure: COLPOSCOPY;  Surgeon: Elby Webb Loges, MD;  Location: [facility] ORS;  Service: Gynecology;  Laterality: N/A;   ESOPHAGOGASTRODUODENOSCOPY (EGD) WITH PROPOFOL  N/A 08/28/2016   Procedure: ESOPHAGOGASTRODUODENOSCOPY (EGD) WITH PROPOFOL ;  Surgeon: Gladis RAYMOND Mariner, MD;  Location: [facility] ENDOSCOPY;  Service: Endoscopy;  Laterality: N/A;    ESOPHAGOGASTRODUODENOSCOPY (EGD) WITH PROPOFOL  N/A 09/15/2019   Procedure: ESOPHAGOGASTRODUODENOSCOPY (EGD) WITH PROPOFOL ;  Surgeon: Mariner Gladis RAYMOND, MD;  Location: [facility] ENDOSCOPY;  Service: Endoscopy;  Laterality: N/A;   ESOPHAGOGASTRODUODENOSCOPY (EGD) WITH PROPOFOL  N/A 09/16/2023   Procedure: ESOPHAGOGASTRODUODENOSCOPY (EGD) WITH PROPOFOL ;  Surgeon: Maryruth Ole DASEN, MD;  Location: [facility] ENDOSCOPY;  Service: Endoscopy;  Laterality: N/A;   HEMOSTASIS CLIP PLACEMENT  09/16/2023   Procedure: HEMOSTASIS CLIP PLACEMENT;  Surgeon: Maryruth Ole DASEN, MD;  Location: [facility] ENDOSCOPY;  Service: Endoscopy;;   POLYPECTOMY  09/16/2023   Procedure: POLYPECTOMY;  Surgeon: Maryruth Ole DASEN, MD;  Location: [facility] ENDOSCOPY;  Service: Endoscopy;;   TONSILLECTOMY  1969   VULVA MARYBETH BIOPSY Bilateral 03/27/2023   Procedure: VULVAR BIOPSY - MULTIPLE;  Surgeon: Elby Webb Loges, MD;  Location: [facility] ORS;  Service: Gynecology;  Laterality: Bilateral;   VULVAR LESION REMOVAL  03/27/2023   Procedure: ABLATION VULVAR INTRAEPITHELIAL LESION;  Surgeon: Elby Webb Loges, MD;  Location: [facility] ORS;  Service: Gynecology;;    Medical History: Past Medical History:  Diagnosis Date   Allergy    Arthritis    Asthma    Barrett esophagus    Chicken pox    Chronic vaginitis    Dysplastic nevus 08/30/2016   upper back spinal, mild   Dysplastic nevus 05/04/2024   L axilla - mild   GERD (gastroesophageal reflux disease)    History of colon polyps    Hyperlipidemia    Hypertension    Osteoporosis    Pre-diabetes    Psoriasis (a type of skin inflammation)    to the scalp   Vitamin D  deficiency    Vulvar intraepithelial neoplasia (VIN) grade 1     Family History: Family History  Problem Relation Age of Onset   Arthritis Mother    Heart disease Mother    Stroke Mother    Hypertension Mother    Alcohol abuse Father    Alcohol abuse Sister    Mental illness Sister    Stomach cancer Sister 68    Alcohol abuse Brother    Drug abuse Brother    Arthritis Maternal Grandmother    Stroke Maternal Grandmother    Colon cancer Maternal Grandfather 52   Breast cancer Neg Hx     Social History   Socioeconomic History   Marital status: Married    Spouse name: Not on file   Number of children: Not on file   Years of education: Not on file   Highest education level: Not on file  Occupational History   Not on file  Tobacco Use   Smoking status: Never   Smokeless tobacco: Never  Vaping Use   Vaping status: Never Used  Substance and Sexual Activity   Alcohol use: No    Alcohol/week: 0.0 standard drinks of alcohol   Drug use: Never   Sexual activity: Yes    Birth control/protection: Post-menopausal  Other Topics Concern   Not on file  Social History Narrative   Married and  Lives at home  with husband .   Social Drivers of Health  Tobacco Use: Low Risk (01/18/2025)   Patient History    Smoking Tobacco Use: Never    Smokeless Tobacco Use: Never    Passive Exposure: Not on file  Financial Resource Strain: Low Risk  (12/07/2024)   Received from [facility]   Overall Financial Resource Strain (CARDIA)    Difficulty of Paying Living Expenses: Not hard at all  Food Insecurity: No Food Insecurity (12/07/2024)   Received from [facility]   Epic    Within the past 12 months, you worried that your food would run out before you got the money to buy more.: Never true    Within the past 12 months, the food you bought just didn't last and you didn't have money to get more.: Never true  Transportation Needs: No Transportation Needs (12/07/2024)   Received from [facility] - Transportation    In the past 12 months, has lack of transportation kept you from medical appointments or from getting medications?: No    Lack of Transportation (Non-Medical): No  Physical Activity: Inactive (02/28/2024)   Exercise Vital Sign    Days  of Exercise per Week: 0 days    Minutes of Exercise per Session: 0 min  Stress: No Stress Concern Present (02/28/2024)   [employer] of Occupational [employer] - Occupational Stress Questionnaire    Feeling of Stress : Not at all  Social Connections: Moderately Integrated (02/28/2024)   Social Connection and Isolation Panel    Frequency of Communication with Friends and Family: More than three times a week    Frequency of Social Gatherings with Friends and Family: More than three times a week    Attends Religious Services: More than 4 times per year    Active Member of [employer] or Organizations: No    Attends Banker Meetings: Never    Marital Status: Married  Catering Manager Violence: Not At Risk (08/19/2024)   Received from [facility]   Epic    Within the last year, have you been afraid of your partner or ex-partner?: No    Within the last year, have you been humiliated or emotionally abused in other ways by your partner or ex-partner?: No    Within the last year, have you been kicked, hit, slapped, or otherwise physically hurt by your partner or ex-partner?: No    Within the last year, have you been raped or forced to have any kind of sexual activity by your partner or ex-partner?: No  Depression (PHQ2-9): Low Risk (01/18/2025)   Depression (PHQ2-9)    PHQ-2 Score: 0  Alcohol Screen: Low Risk (02/28/2024)   Alcohol Screen    Last Alcohol Screening Score (AUDIT): 0  Housing: Unknown (12/07/2024)   Received from [facility]   Epic    In the last 12 months, was there a time when you were not able to pay the mortgage or rent on time?: No    Number of Times Moved in the Last Year: Not on file    At any time in the past 12 months, were you homeless or living in a shelter (including now)?: No  Utilities: Not At Risk (12/07/2024)   Received from [facility]   Epic    In the past 12 months has the electric, gas, oil, or water company  threatened to shut off services in your home?: No  Health Literacy: Adequate Health Literacy (02/28/2024)   B1300 Health  Literacy    Frequency of need for help with medical instructions: Never     Review of Systems  Constitutional:  Negative for chills, fatigue and unexpected weight change.  HENT:  Negative for congestion, rhinorrhea, sneezing and sore throat.   Eyes:  Negative for redness.  Respiratory:  Negative for cough, chest tightness and shortness of breath.   Cardiovascular:  Negative for chest pain and palpitations.  Gastrointestinal:  Negative for abdominal pain, constipation, diarrhea, nausea and vomiting.  Genitourinary:  Negative for dysuria and frequency.  Musculoskeletal:  Positive for arthralgias. Negative for back pain, joint swelling and neck pain.       Right 2nd toe pain  Skin:  Negative for wound.  Neurological: Negative.  Negative for tremors and numbness.  Hematological:  Negative for adenopathy. Does not bruise/bleed easily.  Psychiatric/Behavioral:  Negative for behavioral problems (Depression), sleep disturbance and suicidal ideas. The patient is not nervous/anxious.     Vital Signs: BP 119/70   Pulse 90   Temp 98 F (36.7 C)   Resp 16   Ht 5' 3 (1.6 m)   Wt 252 lb (114.3 kg)   SpO2 99%   BMI 44.64 kg/m    Physical Exam Vitals and nursing note reviewed.  Constitutional:      General: She is not in acute distress.    Appearance: She is well-developed. She is not diaphoretic.  HENT:     Head: Normocephalic and atraumatic.  Eyes:     Extraocular Movements: Extraocular movements intact.  Neck:     Thyroid: No thyromegaly.     Vascular: No JVD.     Trachea: No tracheal deviation.  Cardiovascular:     Rate and Rhythm: Normal rate and regular rhythm.     Heart sounds: Normal heart sounds. No murmur heard.    No friction rub. No gallop.  Pulmonary:     Effort: Pulmonary effort is normal. No respiratory distress.     Breath sounds: No wheezing  or rales.  Chest:     Chest wall: No tenderness.  Musculoskeletal:        General: Signs of injury present. No swelling or tenderness. Normal range of motion.     Comments: Right 2nd toe not tender on exam, no bruising or skin changes  Skin:    General: Skin is warm and dry.  Neurological:     Mental Status: She is alert and oriented to person, place, and time.  Psychiatric:        Behavior: Behavior normal.        Thought Content: Thought content normal.        Judgment: Judgment normal.       Assessment/Plan: 1. Essential (primary) hypertension (Primary) Well controlled, refills given - lisinopril -hydrochlorothiazide  (ZESTORETIC ) 20-25 MG tablet; Take 1 tablet by mouth daily.  Dispense: 30 tablet; Refill: 0  2. Injury of toe on right foot, initial encounter Will order xray for further evaluation, likely sprain, but will ensure no fractures. May need podiatry if continued pain - DG Foot Complete Right; Future  3. Gastroesophageal reflux disease without esophagitis Followed by GI with periodic endoscopies, refills given - pantoprazole  (PROTONIX ) 40 MG tablet; Take 1 tablet (40 mg total) by mouth daily.  Dispense: 90 tablet; Refill: 1  4. Iron deficiency Will monitor labs - CBC w/Diff/Platelet - Fe+TIBC+Fer  5. Prediabetes - Hgb A1C w/o eAG  6. Psoriatic arthritis (HCC) Followed by rheumatology, still determining treatment course  7. Mixed hyperlipidemia - Lipid Panel  With LDL/HDL Ratio  8. Thyroid disorder screening - TSH + free T4  9. Other fatigue - CBC w/Diff/Platelet - Comprehensive metabolic panel with GFR - TSH + free T4 - Hgb A1C w/o eAG - Lipid Panel With LDL/HDL Ratio - Fe+TIBC+Fer  10. Encounter to establish care with new provider Will order labs and schedule wellness visit   General Counseling: leondra cullin understanding of the findings of todays visit and agrees with plan of treatment. I have discussed any further diagnostic evaluation that  may be needed or ordered today. We also reviewed her medications today. she has been encouraged to call the office with any questions or concerns that should arise related to todays visit.    Counseling:    Orders Placed This Encounter  Procedures   DG Foot Complete Right   CBC w/Diff/Platelet   Comprehensive metabolic panel with GFR   TSH + free T4   Hgb A1C w/o eAG   Lipid Panel With LDL/HDL Ratio   Fe+TIBC+Fer    Meds ordered this encounter  Medications   lisinopril -hydrochlorothiazide  (ZESTORETIC ) 20-25 MG tablet    Sig: Take 1 tablet by mouth daily.    Dispense:  30 tablet    Refill:  0   pantoprazole  (PROTONIX ) 40 MG tablet    Sig: Take 1 tablet (40 mg total) by mouth daily.    Dispense:  90 tablet    Refill:  1     This patient was seen by Tinnie Pro, PA-C in collaboration with Dr. Sigrid Bathe as a part of collaborative care agreement.   Time spent:35 Minutes

## 2025-01-20 ENCOUNTER — Ambulatory Visit
Admission: RE | Admit: 2025-01-20 | Discharge: 2025-01-20 | Disposition: A | Source: Ambulatory Visit | Attending: Physician Assistant

## 2025-01-20 ENCOUNTER — Ambulatory Visit
Admission: RE | Admit: 2025-01-20 | Discharge: 2025-01-20 | Disposition: A | Discharge status: Home / Self Care | Attending: Physician Assistant | Admitting: Physician Assistant

## 2025-01-20 DIAGNOSIS — S99921A Unspecified injury of right foot, initial encounter: Secondary | ICD-10-CM

## 2025-01-21 ENCOUNTER — Ambulatory Visit: Payer: Self-pay | Admitting: Physician Assistant

## 2025-01-21 DIAGNOSIS — L4050 Arthropathic psoriasis, unspecified: Secondary | ICD-10-CM | POA: Insufficient documentation

## 2025-01-27 ENCOUNTER — Other Ambulatory Visit: Payer: Self-pay | Admitting: Physician Assistant

## 2025-01-27 DIAGNOSIS — M2041 Other hammer toe(s) (acquired), right foot: Secondary | ICD-10-CM

## 2025-01-27 DIAGNOSIS — S99921A Unspecified injury of right foot, initial encounter: Secondary | ICD-10-CM

## 2025-01-27 NOTE — Telephone Encounter (Signed)
-----   Message from Tinnie MARLA Pro sent at 01/21/2025  9:54 AM EST ----- Please let her know that xray did not show any fracture. It did show hammertoe deformity of of 2nd-5th toes which is a curving of the toes downward, and mild arthritis. If she is interested in a  podiatry referral due to the continued pain then I can put this in for her

## 2025-01-27 NOTE — Telephone Encounter (Signed)
 Spoke with patient regarding toe x-ray results, pt okay to see podiatry.

## 2025-02-04 ENCOUNTER — Ambulatory Visit: Payer: Self-pay | Admitting: Podiatry

## 2025-03-05 ENCOUNTER — Ambulatory Visit: Payer: PPO

## 2025-03-10 ENCOUNTER — Ambulatory Visit

## 2025-05-04 ENCOUNTER — Ambulatory Visit: Admitting: Dermatology

## 2025-05-17 ENCOUNTER — Ambulatory Visit: Admitting: Physician Assistant
